# Patient Record
Sex: Female | Born: 1935 | Race: White | Hispanic: No | Marital: Married | Smoking: Never smoker
Health system: Southern US, Community
[De-identification: ages and names within clinical notes are randomized; demographics above are authoritative.]

## PROBLEM LIST (undated history)

## (undated) DIAGNOSIS — D693 Immune thrombocytopenic purpura: Secondary | ICD-10-CM

## (undated) DIAGNOSIS — D539 Nutritional anemia, unspecified: Secondary | ICD-10-CM

## (undated) DIAGNOSIS — K449 Diaphragmatic hernia without obstruction or gangrene: Secondary | ICD-10-CM

## (undated) DIAGNOSIS — H9193 Unspecified hearing loss, bilateral: Secondary | ICD-10-CM

## (undated) DIAGNOSIS — R310 Gross hematuria: Secondary | ICD-10-CM

## (undated) DIAGNOSIS — M810 Age-related osteoporosis without current pathological fracture: Secondary | ICD-10-CM

## (undated) DIAGNOSIS — I639 Cerebral infarction, unspecified: Secondary | ICD-10-CM

## (undated) DIAGNOSIS — K859 Acute pancreatitis without necrosis or infection, unspecified: Secondary | ICD-10-CM

## (undated) DIAGNOSIS — I2510 Atherosclerotic heart disease of native coronary artery without angina pectoris: Secondary | ICD-10-CM

## (undated) DIAGNOSIS — R5383 Other fatigue: Secondary | ICD-10-CM

## (undated) DIAGNOSIS — I619 Nontraumatic intracerebral hemorrhage, unspecified: Secondary | ICD-10-CM

## (undated) DIAGNOSIS — D696 Thrombocytopenia, unspecified: Secondary | ICD-10-CM

## (undated) DIAGNOSIS — N200 Calculus of kidney: Secondary | ICD-10-CM

## (undated) DIAGNOSIS — E7800 Pure hypercholesterolemia, unspecified: Secondary | ICD-10-CM

## (undated) HISTORY — DX: Calculus of kidney: N20.0

## (undated) HISTORY — DX: Other fatigue: R53.83

## (undated) HISTORY — DX: Gross hematuria: R31.0

## (undated) HISTORY — PX: COLONOSCOPY: SHX174

## (undated) HISTORY — DX: Nontraumatic intracerebral hemorrhage, unspecified: I61.9

## (undated) HISTORY — DX: Age-related osteoporosis without current pathological fracture: M81.0

## (undated) HISTORY — PX: TUBAL LIGATION: SHX77

## (undated) HISTORY — DX: Immune thrombocytopenic purpura: D69.3

## (undated) HISTORY — DX: Nutritional anemia, unspecified: D53.9

## (undated) HISTORY — DX: Thrombocytopenia, unspecified: D69.6

## (undated) HISTORY — DX: Pure hypercholesterolemia, unspecified: E78.00

## (undated) HISTORY — DX: Atherosclerotic heart disease of native coronary artery without angina pectoris: I25.10

## (undated) HISTORY — DX: Unspecified hearing loss, bilateral: H91.93

## (undated) HISTORY — PX: APPENDECTOMY: SHX54

## (undated) HISTORY — DX: Acute pancreatitis without necrosis or infection, unspecified: K85.90

## (undated) HISTORY — DX: Diaphragmatic hernia without obstruction or gangrene: K44.9

## (undated) HISTORY — DX: Cerebral infarction, unspecified: I63.9

---

## 1968-09-19 DIAGNOSIS — K859 Acute pancreatitis without necrosis or infection, unspecified: Secondary | ICD-10-CM

## 1968-09-19 HISTORY — DX: Acute pancreatitis without necrosis or infection, unspecified: K85.90

## 1998-04-16 ENCOUNTER — Ambulatory Visit (HOSPITAL_COMMUNITY): Admission: RE | Admit: 1998-04-16 | Discharge: 1998-04-16 | Payer: Self-pay | Admitting: Obstetrics and Gynecology

## 1998-04-20 ENCOUNTER — Ambulatory Visit (HOSPITAL_COMMUNITY): Admission: RE | Admit: 1998-04-20 | Discharge: 1998-04-20 | Payer: Self-pay | Admitting: Obstetrics and Gynecology

## 1998-05-19 ENCOUNTER — Other Ambulatory Visit: Admission: RE | Admit: 1998-05-19 | Discharge: 1998-05-19 | Payer: Self-pay | Admitting: Obstetrics and Gynecology

## 1998-05-27 ENCOUNTER — Ambulatory Visit (HOSPITAL_COMMUNITY): Admission: RE | Admit: 1998-05-27 | Discharge: 1998-05-27 | Payer: Self-pay | Admitting: Gastroenterology

## 1999-05-07 ENCOUNTER — Encounter: Payer: Self-pay | Admitting: Family Medicine

## 1999-05-07 ENCOUNTER — Ambulatory Visit (HOSPITAL_COMMUNITY): Admission: RE | Admit: 1999-05-07 | Discharge: 1999-05-07 | Payer: Self-pay | Admitting: Family Medicine

## 1999-05-28 ENCOUNTER — Other Ambulatory Visit: Admission: RE | Admit: 1999-05-28 | Discharge: 1999-05-28 | Payer: Self-pay | Admitting: Family Medicine

## 1999-09-03 ENCOUNTER — Encounter: Payer: Self-pay | Admitting: Orthopedic Surgery

## 1999-09-03 ENCOUNTER — Ambulatory Visit (HOSPITAL_COMMUNITY): Admission: RE | Admit: 1999-09-03 | Discharge: 1999-09-03 | Payer: Self-pay | Admitting: Orthopedic Surgery

## 2000-05-08 ENCOUNTER — Encounter: Payer: Self-pay | Admitting: Family Medicine

## 2000-05-08 ENCOUNTER — Ambulatory Visit (HOSPITAL_COMMUNITY): Admission: RE | Admit: 2000-05-08 | Discharge: 2000-05-08 | Payer: Self-pay | Admitting: Family Medicine

## 2000-07-28 ENCOUNTER — Emergency Department (HOSPITAL_COMMUNITY): Admission: EM | Admit: 2000-07-28 | Discharge: 2000-07-28 | Payer: Self-pay | Admitting: Emergency Medicine

## 2001-05-11 ENCOUNTER — Encounter: Payer: Self-pay | Admitting: Family Medicine

## 2001-05-11 ENCOUNTER — Ambulatory Visit (HOSPITAL_COMMUNITY): Admission: RE | Admit: 2001-05-11 | Discharge: 2001-05-11 | Payer: Self-pay | Admitting: Family Medicine

## 2001-07-18 ENCOUNTER — Encounter: Payer: Self-pay | Admitting: Family Medicine

## 2001-07-18 ENCOUNTER — Encounter: Admission: RE | Admit: 2001-07-18 | Discharge: 2001-07-18 | Payer: Self-pay | Admitting: Family Medicine

## 2001-07-25 ENCOUNTER — Encounter: Payer: Self-pay | Admitting: Family Medicine

## 2001-07-25 ENCOUNTER — Encounter: Admission: RE | Admit: 2001-07-25 | Discharge: 2001-07-25 | Payer: Self-pay | Admitting: Family Medicine

## 2001-08-01 ENCOUNTER — Ambulatory Visit (HOSPITAL_COMMUNITY): Admission: RE | Admit: 2001-08-01 | Discharge: 2001-08-01 | Payer: Self-pay | Admitting: Gastroenterology

## 2001-08-01 ENCOUNTER — Encounter (INDEPENDENT_AMBULATORY_CARE_PROVIDER_SITE_OTHER): Payer: Self-pay | Admitting: Specialist

## 2001-08-24 ENCOUNTER — Ambulatory Visit (HOSPITAL_BASED_OUTPATIENT_CLINIC_OR_DEPARTMENT_OTHER): Admission: RE | Admit: 2001-08-24 | Discharge: 2001-08-24 | Payer: Self-pay | Admitting: Urology

## 2001-08-24 ENCOUNTER — Encounter (INDEPENDENT_AMBULATORY_CARE_PROVIDER_SITE_OTHER): Payer: Self-pay | Admitting: *Deleted

## 2001-09-19 DIAGNOSIS — I2510 Atherosclerotic heart disease of native coronary artery without angina pectoris: Secondary | ICD-10-CM

## 2001-09-19 HISTORY — DX: Atherosclerotic heart disease of native coronary artery without angina pectoris: I25.10

## 2002-05-31 ENCOUNTER — Encounter: Payer: Self-pay | Admitting: Family Medicine

## 2002-05-31 ENCOUNTER — Ambulatory Visit (HOSPITAL_COMMUNITY): Admission: RE | Admit: 2002-05-31 | Discharge: 2002-05-31 | Payer: Self-pay | Admitting: Internal Medicine

## 2002-06-11 ENCOUNTER — Other Ambulatory Visit: Admission: RE | Admit: 2002-06-11 | Discharge: 2002-06-11 | Payer: Self-pay | Admitting: Family Medicine

## 2003-06-04 ENCOUNTER — Encounter: Payer: Self-pay | Admitting: Family Medicine

## 2003-06-04 ENCOUNTER — Ambulatory Visit (HOSPITAL_COMMUNITY): Admission: RE | Admit: 2003-06-04 | Discharge: 2003-06-04 | Payer: Self-pay | Admitting: Family Medicine

## 2004-02-11 ENCOUNTER — Encounter: Admission: RE | Admit: 2004-02-11 | Discharge: 2004-02-11 | Payer: Self-pay | Admitting: Family Medicine

## 2004-07-30 ENCOUNTER — Ambulatory Visit (HOSPITAL_COMMUNITY): Admission: RE | Admit: 2004-07-30 | Discharge: 2004-07-30 | Payer: Self-pay | Admitting: Family Medicine

## 2005-03-08 ENCOUNTER — Other Ambulatory Visit: Admission: RE | Admit: 2005-03-08 | Discharge: 2005-03-08 | Payer: Self-pay | Admitting: Family Medicine

## 2005-03-15 ENCOUNTER — Encounter: Admission: RE | Admit: 2005-03-15 | Discharge: 2005-03-15 | Payer: Self-pay | Admitting: Family Medicine

## 2005-08-23 ENCOUNTER — Encounter: Admission: RE | Admit: 2005-08-23 | Discharge: 2005-08-23 | Payer: Self-pay | Admitting: Family Medicine

## 2005-11-11 ENCOUNTER — Encounter: Admission: RE | Admit: 2005-11-11 | Discharge: 2005-11-11 | Payer: Self-pay | Admitting: Family Medicine

## 2005-11-14 ENCOUNTER — Inpatient Hospital Stay (HOSPITAL_COMMUNITY): Admission: AD | Admit: 2005-11-14 | Discharge: 2005-11-16 | Payer: Self-pay | Admitting: Cardiology

## 2005-11-19 ENCOUNTER — Inpatient Hospital Stay (HOSPITAL_COMMUNITY): Admission: EM | Admit: 2005-11-19 | Discharge: 2005-11-22 | Payer: Self-pay | Admitting: Emergency Medicine

## 2005-12-15 ENCOUNTER — Encounter (HOSPITAL_COMMUNITY): Admission: RE | Admit: 2005-12-15 | Discharge: 2006-03-15 | Payer: Self-pay | Admitting: Cardiology

## 2006-03-16 ENCOUNTER — Encounter (HOSPITAL_COMMUNITY): Admission: RE | Admit: 2006-03-16 | Discharge: 2006-04-18 | Payer: Self-pay | Admitting: Cardiology

## 2006-10-25 ENCOUNTER — Encounter: Admission: RE | Admit: 2006-10-25 | Discharge: 2006-10-25 | Payer: Self-pay | Admitting: Family Medicine

## 2006-11-07 ENCOUNTER — Encounter: Admission: RE | Admit: 2006-11-07 | Discharge: 2006-11-07 | Payer: Self-pay | Admitting: Family Medicine

## 2006-11-23 ENCOUNTER — Ambulatory Visit (HOSPITAL_COMMUNITY): Admission: RE | Admit: 2006-11-23 | Discharge: 2006-11-24 | Payer: Self-pay | Admitting: Cardiology

## 2007-09-27 ENCOUNTER — Other Ambulatory Visit: Admission: RE | Admit: 2007-09-27 | Discharge: 2007-09-27 | Payer: Self-pay | Admitting: Family Medicine

## 2007-11-12 ENCOUNTER — Ambulatory Visit (HOSPITAL_COMMUNITY): Admission: RE | Admit: 2007-11-12 | Discharge: 2007-11-12 | Payer: Self-pay | Admitting: Family Medicine

## 2007-12-08 ENCOUNTER — Emergency Department (HOSPITAL_COMMUNITY): Admission: EM | Admit: 2007-12-08 | Discharge: 2007-12-08 | Payer: Self-pay | Admitting: Emergency Medicine

## 2008-08-21 ENCOUNTER — Encounter: Admission: RE | Admit: 2008-08-21 | Discharge: 2008-08-21 | Payer: Self-pay | Admitting: Family Medicine

## 2008-09-19 DIAGNOSIS — I639 Cerebral infarction, unspecified: Secondary | ICD-10-CM

## 2008-09-19 DIAGNOSIS — I619 Nontraumatic intracerebral hemorrhage, unspecified: Secondary | ICD-10-CM

## 2008-09-19 HISTORY — PX: CARDIAC VALVE SURGERY: SHX40

## 2008-09-19 HISTORY — DX: Cerebral infarction, unspecified: I63.9

## 2008-09-19 HISTORY — DX: Nontraumatic intracerebral hemorrhage, unspecified: I61.9

## 2008-10-01 ENCOUNTER — Inpatient Hospital Stay (HOSPITAL_COMMUNITY): Admission: AD | Admit: 2008-10-01 | Discharge: 2008-10-02 | Payer: Self-pay | Admitting: Cardiology

## 2009-04-16 ENCOUNTER — Encounter: Admission: RE | Admit: 2009-04-16 | Discharge: 2009-04-16 | Payer: Self-pay | Admitting: Family Medicine

## 2009-06-19 HISTORY — PX: CORONARY ARTERY BYPASS GRAFT: SHX141

## 2009-07-11 ENCOUNTER — Ambulatory Visit: Payer: Self-pay | Admitting: Cardiology

## 2009-07-11 ENCOUNTER — Observation Stay (HOSPITAL_COMMUNITY): Admission: EM | Admit: 2009-07-11 | Discharge: 2009-07-12 | Payer: Self-pay | Admitting: Emergency Medicine

## 2009-07-22 ENCOUNTER — Inpatient Hospital Stay (HOSPITAL_COMMUNITY): Admission: EM | Admit: 2009-07-22 | Discharge: 2009-07-23 | Payer: Self-pay | Admitting: Emergency Medicine

## 2009-08-18 ENCOUNTER — Encounter: Admission: RE | Admit: 2009-08-18 | Discharge: 2009-09-15 | Payer: Self-pay | Admitting: Family Medicine

## 2009-08-20 ENCOUNTER — Encounter (HOSPITAL_COMMUNITY): Admission: RE | Admit: 2009-08-20 | Discharge: 2009-09-18 | Payer: Self-pay | Admitting: Cardiology

## 2009-09-19 ENCOUNTER — Encounter (HOSPITAL_COMMUNITY): Admission: RE | Admit: 2009-09-19 | Discharge: 2009-09-28 | Payer: Self-pay | Admitting: Cardiology

## 2010-06-22 ENCOUNTER — Ambulatory Visit (HOSPITAL_COMMUNITY): Admission: RE | Admit: 2010-06-22 | Discharge: 2010-06-22 | Payer: Self-pay | Admitting: Family Medicine

## 2010-10-10 ENCOUNTER — Encounter: Payer: Self-pay | Admitting: Family Medicine

## 2010-12-22 ENCOUNTER — Other Ambulatory Visit: Payer: Self-pay | Admitting: Family Medicine

## 2010-12-22 LAB — BASIC METABOLIC PANEL
BUN: 9 mg/dL (ref 6–23)
CO2: 25 mEq/L (ref 19–32)
Chloride: 102 mEq/L (ref 96–112)
Creatinine, Ser: 0.78 mg/dL (ref 0.4–1.2)
Potassium: 4 mEq/L (ref 3.5–5.1)

## 2010-12-22 LAB — COMPREHENSIVE METABOLIC PANEL
ALT: 18 U/L (ref 0–35)
Alkaline Phosphatase: 101 U/L (ref 39–117)
CO2: 23 mEq/L (ref 19–32)
GFR calc non Af Amer: >60 mL/min (ref >60)
Glucose, Bld: 129 mg/dL — ABNORMAL HIGH (ref 70–99)
Potassium: 4.3 mEq/L (ref 3.5–5.1)
Sodium: 134 mEq/L — ABNORMAL LOW (ref 135–145)

## 2010-12-22 LAB — DIFFERENTIAL
Basophils Relative: 0 % (ref 0–1)
Eosinophils Absolute: 0.1 10*3/uL (ref 0.0–0.7)
Neutrophils Relative %: 55 % (ref 43–77)

## 2010-12-22 LAB — CBC
HCT: 31.8 % — ABNORMAL LOW (ref 36.0–46.0)
MCHC: 34.1 g/dL (ref 30.0–36.0)
MCHC: 34.6 g/dL (ref 30.0–36.0)
MCV: 89.5 fL (ref 78.0–100.0)
MCV: 90.3 fL (ref 78.0–100.0)
RBC: 3.52 MIL/uL — ABNORMAL LOW (ref 3.87–5.11)
RBC: 3.64 MIL/uL — ABNORMAL LOW (ref 3.87–5.11)
WBC: 4.8 10*3/uL (ref 4.0–10.5)

## 2010-12-22 LAB — TSH: TSH: 0.603 u[IU]/mL (ref 0.350–4.500)

## 2010-12-22 LAB — PROTIME-INR
INR: 1.17 (ref 0.00–1.49)
Prothrombin Time: 14.8 seconds (ref 11.6–15.2)

## 2010-12-22 LAB — TROPONIN I: Troponin I: 0.04 ng/mL (ref 0.00–0.06)

## 2010-12-22 LAB — LIPID PANEL
HDL: 31 mg/dL — ABNORMAL LOW (ref >39)
Triglycerides: 82 mg/dL (ref <150)
VLDL: 16 mg/dL (ref 0–40)

## 2010-12-22 LAB — CK TOTAL AND CKMB (NOT AT ARMC): Relative Index: INVALID (ref 0.0–2.5)

## 2010-12-23 LAB — CARDIAC PANEL(CRET KIN+CKTOT+MB+TROPI)
CK, MB: 2.4 ng/mL (ref 0.3–4.0)
CK, MB: 3.1 ng/mL (ref 0.3–4.0)
Relative Index: INVALID (ref 0.0–2.5)
Total CK: 39 U/L (ref 7–177)

## 2010-12-23 LAB — POCT CARDIAC MARKERS
CKMB, poc: 1.5 ng/mL (ref 1.0–8.0)
Troponin i, poc: 0.08 ng/mL (ref 0.00–0.09)
Troponin i, poc: 0.09 ng/mL (ref 0.00–0.09)

## 2010-12-23 LAB — DIFFERENTIAL
Basophils Absolute: 0 10*3/uL (ref 0.0–0.1)
Eosinophils Absolute: 0.1 10*3/uL (ref 0.0–0.7)
Eosinophils Relative: 1 % (ref 0–5)

## 2010-12-23 LAB — PROTIME-INR: Prothrombin Time: 13.3 seconds (ref 11.6–15.2)

## 2010-12-23 LAB — CBC
HCT: 32.6 % — ABNORMAL LOW (ref 36.0–46.0)
MCHC: 34.8 g/dL (ref 30.0–36.0)
MCV: 91.9 fL (ref 78.0–100.0)
Platelets: 331 10*3/uL (ref 150–400)
RDW: 13.9 % (ref 11.5–15.5)

## 2010-12-23 LAB — BASIC METABOLIC PANEL
BUN: 12 mg/dL (ref 6–23)
CO2: 24 mEq/L (ref 19–32)
Chloride: 103 mEq/L (ref 96–112)
GFR calc Af Amer: >60 mL/min (ref >60)
Glucose, Bld: 117 mg/dL — ABNORMAL HIGH (ref 70–99)
Sodium: 135 mEq/L (ref 135–145)

## 2010-12-23 LAB — TROPONIN I: Troponin I: 0.12 ng/mL — ABNORMAL HIGH (ref 0.00–0.06)

## 2010-12-28 ENCOUNTER — Ambulatory Visit
Admission: RE | Admit: 2010-12-28 | Discharge: 2010-12-28 | Disposition: A | Discharge status: Home / Self Care | Payer: BC Managed Care – PPO | Source: Ambulatory Visit | Attending: Family Medicine | Admitting: Family Medicine

## 2011-01-03 LAB — CBC
HCT: 35 % — ABNORMAL LOW (ref 36.0–46.0)
Platelets: 129 10*3/uL — ABNORMAL LOW (ref 150–400)
RDW: 13.4 % (ref 11.5–15.5)
WBC: 8.3 10*3/uL (ref 4.0–10.5)

## 2011-01-03 LAB — BASIC METABOLIC PANEL
BUN: 19 mg/dL (ref 6–23)
Calcium: 9.1 mg/dL (ref 8.4–10.5)
GFR calc non Af Amer: 58 mL/min — ABNORMAL LOW (ref >60)
Potassium: 4.1 mEq/L (ref 3.5–5.1)
Sodium: 139 mEq/L (ref 135–145)

## 2011-02-01 NOTE — Cardiovascular Report (Signed)
[name], [name] NO.:  [id number]   MEDICAL RECORD NO.:  [id number]          PATIENT TYPE:  INP   LOCATION:  2502                         FACILITY:  [facility]   PHYSICIAN:  Adrienne Bathe, MD      DATE OF BIRTH:  1933-06-04   DATE OF PROCEDURE:  10/01/2008  DATE OF DISCHARGE:                            CARDIAC CATHETERIZATION   CARDIOLOGIST:  Adrienne December, MD   INDICATIONS:  Chest pain.  This is a 77 year old female with a history  of HOCM with coronary artery disease status post stent implantation x2  in the mid LAD on November 15, 2005, with recurrent chest discomfort.   PROCEDURES:  1. Left heart catheterization.  2. Selective coronary angiography.  3. Left ventriculogram.   PROCEDURE DETAILS:  The patient was prepped and draped in a sterile  fashion, placed on the catheterization table.  A 1% lidocaine was used  for local anesthesia of the right groin.  The femoral head was  visualized with fluoroscopy.  Prior to procedure, informed consent was  obtained and risks and benefits of the procedure including stroke, heart  attack, death, renal impairment, arterial damage were explained to the  patient at length.  Unfortunately, Dr. Amil [name] was unable to perform the  diagnostic portion of this catheterization secondary to him being in a  defibrillator procedure; however, I spoke at length with the patient and  she was fine with me performing the diagnostic portion.  He explained  the medical history of the patient to me at length.  Using the modified  Seldinger technique, a 6-French sheath was placed into the right femoral  artery.  A Judkins left #4 catheter was then used to selectively  cannulate the left main artery.  Multiple views with hand injection of  Omnipaque were obtained.  Over the wire, exchange was performed with a  Judkins right #4 catheter, which was used to selectively cannulate the  right coronary artery.  Multiple views with hand injection  of Omnipaque  were obtained.  This catheter was then exchanged for an angled pigtail,  which was used across the aortic valve into the left ventricle.  Hemodynamics obtained.  A left ventriculogram was performed utilizing 20  mL of dye in the RAO position with power injection.  As of note prior to  procedure, prednisone was administered previously given her IODINE  ALLERGY.  She also received Pepcid and Benadryl.  See cath records for  details.  Following procedure, findings were discussed with Dr. Amil [name]  who proceeded with IVUS and possible PCI.   FINDINGS:  1. Left main artery - Widely patent.  No angiographically significant      coronary artery disease.  Branches into the left anterior      descending artery and circumflex artery.  2. Left anterior descending artery - There is new proximal stenosis,      which is calcified, focal lesion proximal to the previously placed      mid LAD stent.  Stenosis is 70-80%.  Previously placed stents have      minor irregularities within, however, are grossly patent.  The      first large diagonal branch has approximately 50-60% ostial      stenosis.  The LAD then continues to wrap around the apex.  3. Circumflex artery - The first obtuse marginal branch has an      approximately 50% stenosis ostial.  Otherwise, this is a large-      caliber vessel without any other significant disease present.  4. Right coronary artery - There is a 50-60% lesion in the mid right      coronary artery at a bend.  This lesion has not significantly      changed from previous catheterization in 2007.  This is the      dominant vessel giving rise to the posterior descending artery.  5. Left ventriculogram - Normal left ventricular ejection fraction      with no wall motion abnormalities.   HEMODYNAMIC RESULTS:  Left ventricular systolic pressure is 133.  Aortic  pressure is 133/51 with a mean of 82.  There is no significant gradient.  I did not detect an outflow  tract gradient.  She does have a history of  HOCM.   IMPRESSION:  1. Worsening coronary artery disease - 70-80% proximal left anterior      descending calcified lesion proximal to the mid left anterior      descending stented portion.  Other disease includes 50% ostial      diagonal branch, 50% ostial circumflex, obtuse marginal-1 branch,      as well as 50-60% mid right coronary artery branch without any      significant change from prior catheterization.  2. Normal left ventricular ejection fraction of 60-65% with no wall      motion abnormalities.  There is 1+ mitral regurgitation noted, may      be catheter induced.  3. Mildly elevated left ventricular end-diastolic pressure of 13      indicative of mild diastolic dysfunction.  4. No evidence of aortic stenosis.   PLAN:  Findings discussed with Dr. Corliss [name] who will now perform  intravascular ultrasound to further evaluate her proximal LAD lesion  with possible percutaneous intervention to this artery.  Findings were  also discussed with the patient.      Adrienne Bathe, MD  Electronically Signed     MCS/MEDQ  D:  10/01/2008  T:  10/02/2008  Job:  [id number]   cc:   [name]. [name], M.D.

## 2011-02-01 NOTE — Discharge Summary (Signed)
NAMEMEMORY, HEINRICHS             ACCOUNT NO.:  [id number]   MEDICAL RECORD NO.:  [id number]          PATIENT TYPE:  INP   LOCATION:  2502                         FACILITY:  [facility]   PHYSICIAN:  Francisca December, M.D.  DATE OF BIRTH:  Mar 25, 1933   DATE OF ADMISSION:  10/01/2008  DATE OF DISCHARGE:  10/02/2008                               DISCHARGE SUMMARY   DISCHARGE DIAGNOSES:  1. Coronary artery disease, status post drug-eluting stent to the left      anterior descending.  2. 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  3. Severe mitral regurgitation.  4. Hyperlipidemia.  5. INTRAVENOUS CONTRAST allergy.  6. Multiple medication sensitivities including multiple statins,      significant cough on ACE inhibitors and angiotensin II receptor      blockers.  7. Long-term medication use.   HOSPITAL COURSE:  [name] is a 77 year old female who has known  coronary artery disease.  She has had multiple stent placements to her  LAD.  She returned continuing to demonstrate New York Heart Association  Class III symptoms.  If she walks briskly one flight of stairs dyspnea,  chest tightness, lightheadedness is produced.  She stops to rest for 5  minutes and symptoms stop.  She was seen in the office and desired to  proceed with cardiac catheterization.   The catheterization took place on October 01, 2008, and she was found to  have 80% proximal LAD lesion just prior to her stent.  In-stent mild  irregularity.  The first diagonal had 80% ostial lesion, circumflex 50%  OM at the ostium.  The right coronary artery 60% mid RCA.  This had been  unchanged from previous cath.  Her EF is normal.   Dr. Amil Amen then came in and implanted a drug-eluting stent to the LAD  without trouble.  She remained in the hospital overnight and was in  stable condition.   LABORATORY STUDIES:  Hemoglobin 12.1, hematocrit 35.0, platelets 129,  and white count 8.3.  Sodium 139, potassium 4.1, chloride  109, CO2 of  25, BUN 19, creatinine 0.95, glucose 114, and calcium 9.1.   She is being discharged to home in stable and in improved condition.   DISCHARGE MEDICATIONS:  1. Vytorin 1 tablet daily.  2. Multivitamin daily.  3. Enteric-coated aspirin 325 mg a day.  4. Plavix 75 mg a day.  5. Metoprolol 50 mg twice a day.  6. Os-Cal daily.  7. Atacand daily.  8. Lysine, Aquaphor, Omacor daily.  9. Zantac 150 mg twice a day.  10.Verapamil 180 mg each evening.  11.Benadryl at bedtime as needed.  12.Sublingual nitroglycerin p.r.n. chest pain.   She is to remain on a low-sodium, heart-healthy diet.  Clean cath site  gently with soap and water.  No scrubbing.  May increase activity as per  cardiac rehabilitation.  No lifting over 10 pounds for 1 week.  No  driving for 2 days.  Follow up with Dr. Laureen Abrahams, nurse  practitioner on October 16, 2008, at 10 a.m.      Guy Franco, P.A.      John H.  Amil Amen, M.D.  Electronically Signed    LB/MEDQ  D:  10/02/2008  T:  10/03/2008  Job:  [id number]   cc:   Francisca December, M.D.  Bryan Lemma. Manus Gunning, M.D.

## 2011-02-04 NOTE — Procedures (Signed)
[facility]  Patient:    [name], [name] Visit Number: [id number] MRN: [id number]          Service Type: Attending:  Verlin Grills, M.D. Dictated by:   Verlin Grills, M.D. Proc. Date: 08/01/01   CC:         [name], M.D.   Procedure Report  REFERRING PHYSICIAN:  Blair Heys, M.D., [facility].  PROCEDURE:  Colonoscopy with polypectomy.  ENDOSCOPIST:  Verlin Grills, M.D.  INDICATIONS:  [name] (date of birth 1933/01/07), is a 77 year old female who is referred for diagnostic colonoscopy to evaluate guaiac positive stool.  [name] has two first degree relatives diagnosed with colon cancer (mother and sister).  I discussed with [name] the complications associated with colonoscopy and polypectomy including intestinal bleeding and intestinal perforation.  [name] has has signed the operative permit.  PREMEDICATION:  Fentanyl 50 mcg, Versed 10 mg.  ENDOSCOPE:  Olympus pediatric colonoscope.  PROCEDURE IN DETAIL:  After obtaining informed consent, [name] was placed in the left lateral decubitus position.  I administered intravenous Versed and intravenous Fentanyl to achieve conscious sedation for the procedure.  The patients blood pressure, oxygen saturation, and cardiac rhythm were monitored throughout the procedure and documented in the medical record.  Anal inspection was normal.  Digital rectal exam was normal.  The Olympus pediatric video colonoscope was introduced into the rectum and advanced to the cecum.  Colonic preparation for the exam today was excellent.  RECTUM:  From the distal rectum two 0.5 mm sessile polyps were removed with cold biopsy forceps and submitted for pathological interpretation.  SIGMOID COLON AND DESCENDING COLON:  Normal.  SPLENIC FLEXURE:  Normal.  TRANSVERSE COLON:  Normal.  HEPATIC FLEXURE:  Normal.  ASCENDING  COLON:  Normal.  CECUM AND ILEOCECAL VALVE:  Normal.  ASSESSMENT:  Two 0.5 mm sessile polyps were removed from the distal rectum with cold biopsy forceps; otherwise normal proctocolonoscopy to the cecum.  RECOMMENDATIONS:  Repeat colonoscopy in approximately five years. Dictated by:   Verlin Grills, M.D. Attending:  Verlin Grills, M.D. DD:  08/01/01 TD:  08/01/01 Job: [id number]

## 2011-02-04 NOTE — Cardiovascular Report (Signed)
[name], [name]             ACCOUNT NO.:  [id number]   MEDICAL RECORD NO.:  [id number]          PATIENT TYPE:  INP   LOCATION:  3728                         FACILITY:  [facility]   PHYSICIAN:  Vesta Mixer, M.D. DATE OF BIRTH:  1933-02-28   DATE OF PROCEDURE:  11/19/2005  DATE OF DISCHARGE:                              CARDIAC CATHETERIZATION   PROCEDURES:  Left heart catheterization with left ventriculogram. Right  femoral artery was easily cannulated using modified Seldinger technique and  a Smart needle.   HEMODYNAMIC RESULTS:  LV pressure following the LV gram was 130/7. Aortic  pressure was 105/59.   The initial left ventricular pressure when we just entered the aortic valve  was 105 systolic.   ANGIOGRAPHY:  Left main is normal.   Left anterior descending artery has mild disease proximally. The proximal  and midstents are widely patent. There is a large diagonal artery that  originates in the middle of the stented segment. This vessel still has an  80% stenosis at its origin. This is unchanged from the previous heart  catheterization performed last week by Dr. Amil Amen. The flow was quite brisk  down this vessel.   Left circumflex artery has mild irregularities in the proximal segment.  There was 20-30% stenosis at the origin of the first obtuse marginal artery.   Right coronary artery has a proximal 50% stenosis. This was unchanged from a  previous cath. It does not appear to obstruct flow.   Left ventriculogram reveals overall normal left ventricular systolic  function. There is moderate hypokinesis of the anterior apex but this  appears to be somewhat improved compared to her previous cath.   She was noted to have a gradient on the LV to AO pullback. When we initially  entered the left ventricle, she did not have any evidence of a gradient. In  fact, the aortic valve is crossed quite easily. There is certainly no aortic  stenosis. We performed the pullback  twice and both times reported a gradient  from the mid to left ventricle to the aorta.   It also should be noted that we gave her metoprolol during the case. Slowing  her heart rate dramatically increased her pulse pressure.   COMPLICATIONS:  None.   CONCLUSION:  1.  Coronary artery disease: She has a nice result from her left anterior      descending artery stenting. The diagonal vessel was still somewhat      compromised but there was brisk flow and I do not think that further      angioplasty or stenting is warranted at this time.  2.  Hypertrophic obstructive cardiomyopathy: The patient has significant mid-      ventricular outflow obstruction. This may be the cause of her severe      chest pain. We will increase her metoprolol to 50 milligrams p.o. b.i.d.      and will give her intermittent doses of IV metoprolol as needed. We will      stop Integrilin, heparin and      nitroglycerin. We took a brief angiogram of the right femoral  artery. It      revealed a nice artery with no evidence of pseudoaneurysm. She does have      a moderate-sized hematoma associated with this vessel from her previous      heart catheterization. She will return to the step-down unit in      satisfactory condition.           ______________________________  Vesta Mixer, M.D.     PJN/MEDQ  D:  11/19/2005  T:  11/21/2005  Job:  [id number]   cc:   Bryan Lemma. Manus Gunning, M.D.  Fax: [phone]   Francisca December, M.D.  Fax: [phone]

## 2011-02-04 NOTE — H&P (Signed)
Adrienne, [name] NO.:  [id number]   MEDICAL RECORD NO.:  [id number]          PATIENT TYPE:  INP   LOCATION:  2926                         FACILITY:  [facility]   PHYSICIAN:  Vesta Mixer, M.D. DATE OF BIRTH:  11-26-1932   DATE OF ADMISSION:  11/19/2005  DATE OF DISCHARGE:                                HISTORY & PHYSICAL   [name] is a 77 year old female with a recent diagnosis of coronary  artery disease. She is status post recent PTCA and stenting to her left  anterior descending artery. She presents today with episodes of light  headedness and diaphoresis and hypotension. She was also found to have  return of some EKG abnormalities. She is admitted for further evaluation.   [name] has been in relatively good health. She has a history of  hyperlipidemia for some time. She recently started having some very atypical  symptoms. She was found on EKG to have Q waves in the anterior leads. A  subsequent echocardiogram revealed akinesis of the distal anterior wall as  well as her apex. She was also found to have severe mitral regurgitation.  She underwent catheterization earlier this week and was found to have severe  disease involving her left anterior descending artery and diagonal. She  underwent successful PTCA of her left anterior descending artery using two  0.75 mm Taxus stents. Patient was noted to have compromise of the first  diagonal vessel at the end of the procedure of about 80%. This diagonal was  jailed by the stent struts. Dr. Amil Amen was able to angioplasty out into the  diagonal vessel and improved the lumen slightly but there still remained an  80% stenosis at the end of the case.   The patient was discharged on November 16, 2005. She now returns on November 19, 2005. She was awakened from her sleep with profuse sweating and light  headedness. She took her blood pressure and found she had a systolic  pressure of 70. She called EMS. She  was brought to the hospital and was  given IV fluids. EKG revealed Q waves in the anterior leads. She was found  to have return of her Q wave inversions across the anterolateral leads. She  was also found to have new T wave inversions in the inferior leads. She is  now admitted for further evaluation.   She feels a little bit better although she is still having a little bit of  chest tightness or closeness as she tells it.   CURRENT MEDICATIONS:  1.  Plavix 75 mg daily.  2.  Aspirin 325 mg daily.  3.  Altace 2.5 mg daily.   ALLERGIES:  IV CONTRAST.   PAST MEDICAL HISTORY:  1.  Hyperlipidemia.  2.  Hyponatremia.  3.  Recent anterior wall myocardial infarction.  4.  Mitral regurgitation.   SOCIAL HISTORY:  The patient is a non smoker.   FAMILY HISTORY:  Positive for cardiac disease.   REVIEW OF SYSTEMS:  Reviewed and are negative except as noted in the history  of present illness.   PHYSICAL EXAMINATION:  GENERAL:  She is an elderly female in no acute  distress. She is still having slight chest pressure but it seems to be  slowly improving.  VITAL SIGNS:  Blood pressure is 115/70 with a heart rate of 80.  HEENT:  Reveals 2+ carotids, no bruits, no jugular venous distension, no  thyromegaly.  LUNGS:  Clear to auscultation.  HEART:  Regular rate S1, S2. She has a 2/6 systolic ejection murmur at the  left sternal border.  ABDOMEN:  Good bowel sounds, nontender.  EXTREMITIES:  The groin is still slightly tender.  She has good distal  pulse.  There is no edema.  NEURO EXAM:  Nonfocal.   Her EKG reveals normal sinus rhythm. There is T wave inversion in the  anterolateral leads and T wave inversions in the inferior leads. The  anterolateral T wave inversions have been present prior to the stent  placement and have improved slightly. These have now returned to their pre-  stent appearance. The T wave inversions inferiorly are new but the  __________  (it should be noted she does  have a 50% RCA stenosis).   LABORATORY DATA:  Her CPK is 107 with an MB of 6.4, troponin is 0.3.   The remainder of her laboratory data is pending.   IMPRESSION AND PLAN:  Acute coronary syndrome. The patient presents  following a recent anterior wall myocardial infarction and stent placement.  The first diagonal artery was jailed during the stent procedure. Attempts  were made to open that vessel at that time but they were only partially  successful. It was determined that the stent placement out to the diagonal  vessel would not necessarily improve the lumen so it was decided to treat  her medically. She now presents with recurrent T wave inversions,  hypotension, and diaphoresis. We have placed her on heparin and Integrilin  and she is slowly improving. We will continue with medical therapy now and  will proceed to the cath-lab if she worsens. We will anticipate repeat heart  catheterization on Sunday by Dr. Amil Amen.           ______________________________  Vesta Mixer, M.D.     PJN/MEDQ  D:  11/19/2005  T:  11/19/2005  Job:  [id number]   cc:   Francisca December, M.D.  Fax: [phone]   Bryan Lemma. Manus Gunning, M.D.  Fax: [phone]

## 2011-02-04 NOTE — Discharge Summary (Signed)
[name], [name]             ACCOUNT NO.:  [id number]   MEDICAL RECORD NO.:  [id number]          PATIENT TYPE:  OIB   LOCATION:  6527                         FACILITY:  [facility]   PHYSICIAN:  Francisca December, M.D.  DATE OF BIRTH:  [birth year]   DATE OF ADMISSION:  11/23/2006  DATE OF DISCHARGE:  11/24/2006                               DISCHARGE SUMMARY   ADMISSION DIAGNOSIS:  Recurrent angina with an abnormal GXT.   DISCHARGE DIAGNOSIS:  1. Recurrent angina with an abnormal graded exercise test, status post      cardiac catheterization with percutaneous coronary      intervention/drug-eluting stent implantation in the proximal      anterior descending artery, November 23, 2006.  2. Two-vessel arteriosclerotic cardiovascular disease, status post      percutaneous coronary intervention/drug-eluting stent implantation      times two to the mid left anterior descending, November 15, 2005,      X2.  3. Normal left ventricular size and systolic function with an ejection      fraction of 75%.  4. Mild hypertrophic obstruction cardiomyopathy via 2-D echocardiogram      February, 2008.  5. Moderate mitral regurgitation.  6. Hyperlipidemia.  7. INTRAVENOUS CONTRAST DYE ALLERGY.  8. Multiple MEDICATION SENSITIVITIES including multiple STATIN'S.  9. Significant cough on ACE and ARB.  10.Status post appendectomy.  11.Status post tubal ligation.  12.Leukocytosis, questionable etiology.  13.Anemia, stable.  14.Mild hyperkalemia.   PROCEDURES:  On  11/23/2006:  1. Left heart catheterization.  2. Left ventriculogram.  3. Coronary angiography.  4. PCI/drug-eluting stent implantation to the proximal anterior      descending artery.   CONSULTATIONS:  None.   HOSPITAL COURSE:  [name] is a 77 year old female with a known  history of coronary artery disease.  She was admitted to the [facility] on November 23, 2006 as an outpatient for cardiac catheterization  secondary to angina  and an abnormal GXT.  She underwent cardiac  catheterization with PCI/drug-eluting stent implantation to the proximal  anterior descending artery.  The procedure was well tolerated by the  patient with an estimated minimal blood loss.  Angio-Seal closure was  used.  The patient was observed overnight with bedrest and IV hydration.  During this admission, she had no further complaints of angina, or  shortness of breath.  Her groin was stable without evidence of bleeding,  oozing, or hematoma.  She was discharged to home in stable condition  after being seen by Dr. Corliss Marcus.   LABORATORY DATA:  White blood count 11.4, hemoglobin 10.4, hematocrit  30.0, platelets 108,000.  Sodium 134, potassium 5.5, chloride 101, CO2  21, glucose 97, BUN 17, creatinine 0.91.   CLINICAL DATA:  X-rays:  None during this admission.  EKG: 12-lead EKG  November 24, 2006 revealed sinus bradycardia with a ventricular rate of 49  beats per minute.  There was no evidence of acute ST-segment/T-wave  changes.  QTC 429 milliseconds.   CONDITION ON DISCHARGE:  Stable.   DISCHARGE MEDICATIONS:  1. Plavix 75 mg daily.  2. Aspirin 325  mg daily.  3. Vytorin 10/20 mg daily.  4. Metoprolol 50 mg three times daily.  5. Atacand 4 mg daily.  6. Evista 60 mg daily.  7. Omacor 1000 mg twice daily.  8. Multivitamin daily.  9. Sublingual nitroglycerin 0.4 mg as needed for chest pain.  A      prescription refill was provided.  10.Gaviscon as needed.   DISCHARGE INSTRUCTIONS:  1. Continue a low-salt, low-fat, and low cholesterol diet.  2. No driving for 2 days.  3. No lifting greater than 10 pounds for 1 week.  4. Bathe groin area with warm soap and water.  Do not scrub area.      Avoid straining.   FOLLOWUP:  Follow-up arrangements:  An appointment for a groin check has  been scheduled with a physician extender at [facility] on November 30, 2006 at 11 o'clock a.m.  The patient is scheduled to call [phone]  if she  is unable to keep the scheduled appointment.      [name], [name]      Francisca December, M.D.  Electronically Signed    RDM/MEDQ  D:  11/24/2006  T:  11/24/2006  Job:  [id number]   cc:   Bryan Lemma. Manus Gunning, M.D.

## 2011-02-04 NOTE — Op Note (Signed)
[facility]  Patient:    [name], [name] Visit Number: [id number] MRN: [id number]          Service Type: NES Location: [facility] Attending Physician:  Londell Moh Dictated by:   Jamison Neighbor, M.D. Proc. Date: 08/24/01 Admit Date:  08/24/2001 Discharge Date: 08/24/2001   CC:         [name], M.D.   Operative Report  PREOPERATIVE DIAGNOSIS:  Microscopic hematuria.  POSTOPERATIVE DIAGNOSIS:  Microscopic hematuria.  PROCEDURES:  Cystoscopy, bilateral retrograde, bilateral renal pelvic washings, right ureteroscopy, right double J catheter insertion.  SURGEON:  Jamison Neighbor, M.D.  ANESTHESIA:  General.  COMPLICATIONS:  None.  DRAINS:  None.  BRIEF HISTORY:  This 77 year old female developed hematuria.  She has a strong allergy to iodine and for that reason cannot be given contrast.  Noncontrast CT demonstrated a little hydronephrosis on the right-hand side, but otherwise no abnormalities were seen.  The patient needs to undergo cystoscopy and does need to have an imaging study to further evaluate that mild hydronephrosis and for that reason, it was recommended that retrograde studies with non-ionic contrast be performed.  The patient understands the risks and benefits of the procedure.  She was given Benadryl preoperatively as well as appropriate antibiotics.  She gave full and informed consent.  DESCRIPTION OF PROCEDURE:  After the successful induction of general anesthesia, the patient was placed in the dorsal lithotomy position, prepped with Hibiclens, and draped in the usual sterile fashion.  Cystoscopy was performed with the 12 degree lens and then followed by that with the 70 degree lens.  Both ureteral orifices were normal in configuration and location.  No tumors or stones could be seen.  With the 12 degree lens in place, the retrograde study was performed on each side, but prior to the retrograde, washings  were done on each side.  A ureteral catheter was passed and washings with normal saline were sent for cytology.  Retrogrades were then performed through the same catheter, and on the left-hand side the calyceal system was noted to be very finely cupped.  There was a good drain-out film.  There was no sign of obstruction or filling defect.  On the left-hand side, the patient had moderate hydronephrosis that did seem to come all the way down to the bladder.  That ureteral orifice was generally normal but was a little hard to cannulate, but certainly it was not felt to have a true stenosis or stricture. Drain-out films were unremarkable without an obvious filling defect.  Because of the hydronephrosis noted on this study as well as earlier imaging studies, as well as the fact that the etiology for hematuria had not yet been obtained, it was felt that a ureteroscopic examination of that ureter was indicated. This ureter was dilated with a balloon dilator.  The 6 French ureteroscope was then advanced all the way through the ureter and actually up into the renal pelvis.  The calyceal systems were carefully inspected.  The upper calyces could easily be identified and visualized, as could the midpole calyces, but because this was a rigid ureteroscope, the lower pole calyces could not be visualized.  It was not felt that flexible ureteroscopy was indicated, since the patient had no signs of any filling defect on retrogrades and no abnormality seen on the previous CT.  The ureteroscope was withdrawn and the entire ureter was carefully inspected.  No tumors, stones, stricture, stenosis, or other abnormalities could be detected.  The guidewire was left in place and over the guidewire, a 6 French x 24 cm double J catheter was passed. The cystoscope was inserted alongside the guidewire, and after the guidewire was removed, the string was used to carefully position the stent so that it just came into the  bladder.  The bladder was inspected one more time with the 70 degree lens, and no abnormalities whatsoever could be detected.  The bladder was drained.  The patients string was attached to the skin with a Tegaderm.  A B&O suppository was inserted.  She was given intraoperative Toradol and Zofran.  She will be sent home with a prescription for Lorcet Plus, Pyridium Plus, and three days of Levaquin, and will remove her string herself on Monday.  The patient will return in follow-up in two weeks time. Dictated by:   Jamison Neighbor, M.D. Attending Physician:  Londell Moh DD:  08/24/01 TD:  08/25/01 Job: [id number]

## 2011-02-04 NOTE — Discharge Summary (Signed)
[name], [name]             ACCOUNT NO.:  [id number]   MEDICAL RECORD NO.:  [id number]          PATIENT TYPE:  INP   LOCATION:  3728                         FACILITY:  [facility]   PHYSICIAN:  [name], M.D.  DATE OF BIRTH:  [birth year]   DATE OF ADMISSION:  11/19/2005  DATE OF DISCHARGE:  11/22/2005                                 DISCHARGE SUMMARY   DISCHARGE DIAGNOSES:  1.  Hypertrophic obstructive cardiomyopathy, mild to moderate, beta blocker      therapy increased.  2.  Coronary artery disease with recent apical anteroapical myocardial      infarction.  3.  Family history of coronary artery disease.  4.  Hypercholesterolemia.  5.  Allergy to IV CONTRAST.  6.  Dyslipidemia.  7.  Moderate to severe mitral regurgitation.  8.  Multiple medication therapy.   HOSPITAL COURSE:  [name] is a 77 year old female who was initially  admitted from February 26-28, 2007, due to an anteroapical myocardial  infarction.  During that hospitalization she underwent percutaneous  intervention utilizing a drug-eluting stent to the mid-left anterior  descending artery.  She did have residual stent jail of moderate size to the  diagonal branch.  Her EF was 45%.  She was noted to have moderate mitral  regurgitation with normal right heart pressures.   She was readmitted during this hospitalization with dizziness, diaphoresis  and hypotension.  In addition, she had midsternal pain with right shoulder  tightness.  She was seen in the emergency room by Vesta Mixer, M.D.,  who was on call for Dr. Ty Hilts at that time.  She then underwent emergent  cardiac catheterization due to her recent percutaneous  intervention/myocardial infarction.  He also had noted that T-wave  inversions of the inferior leads were new.  The cardiac catheterization  findings were consistent with HOCM.  There was an LVOT gradient noted on the  pullback.  The LAD stents were widely patent and Dr. Elease Hashimoto felt that  the  patient was having symptoms due to her HOCM with a significant gradient  within her LV.  He recommended increasing her metoprolol and over the next  several days, this was done.  By November 22, 2005, she was ready for discharge  to home and her metoprolol has been increased significantly to 50 mg t.i.d.  Her other medications upon discharge included:   1.  Plavix 75 mg a day.  2.  Enteric-coated aspirin 325 mg a day.  3.  Vytorin 10/20 once daily.  4.  Lisinopril 5 mg one-half tablet daily.  5.  Sublingual nitroglycerin as needed for chest pain.   She is not to drive for two weeks.  No lifting over 10 pounds for two weeks.  No physical exertion until seen by Dr. Amil Amen.  She has an appointment to  see Dr. Amil Amen on December 01, 2005.   Other lab studies during the patient's hospitalization include a BUN of 18,  creatinine of 1.0.  Hemoglobin 13, hematocrit 36.7.  normal cardiac enzymes  with the exception of a maximum troponin at 0.10.   The patient is  discharged home in stable condition.      Guy Franco, P.A.      [name], M.D.  Electronically Signed    LB/MEDQ  D:  11/22/2005  T:  11/23/2005  Job:  [id number]

## 2011-02-04 NOTE — Cardiovascular Report (Signed)
[name], [name]             ACCOUNT NO.:  [id number]   MEDICAL RECORD NO.:  [id number]          PATIENT TYPE:  OIB   LOCATION:  2807                         FACILITY:  [facility]   PHYSICIAN:  Francisca December, M.D.  DATE OF BIRTH:  [birth year]   DATE OF PROCEDURE:  11/23/2006  DATE OF DISCHARGE:                            CARDIAC CATHETERIZATION   PROCEDURES PERFORMED:  1. Left heart catheterization.  2. Left ventriculogram.  3. Coronary angiography.  4. PCI/drug-eluting stent implantation proximal anterior descending      artery.   INDICATIONS:  [name] is a 77 year old woman with known coronary  vascular disease.  She is S/P DE stent implantation x2 in the mid-LAD,  November 15, 2005.  She has redeveloped a typical anginal syndrome.  A  recent myocardial perfusion study with exercise documented angina with  exercise and ischemic electrocardiographic changes.  Interestingly, the  perfusion images were normal.  Because of her very typical symptoms, the  ischemic electrocardiographic changes, as well as her history, she is  brought to the catheterization laboratory at this time to identify the  extent of disease and provide for further therapeutic options.   PROCEDURE NOTE:  The patient is brought to cardiac catheterization  laboratory in a fasting state.  The right groin was prepped and draped  in the usual sterile fashion.  Local anesthesia was obtained with the  infiltration of 1% lidocaine.  A 5-French catheter sheath was inserted  percutaneously into the right femoral artery utilizing an anterior  approach over a guiding J-wire.  Left heart catheterization and coronary  angiography then proceed in a standard fashion using 5-French #4 left  and right Judkins catheters and a 110-cm pigtail catheter.  I was not  able to selectively engage the anterior descending artery with the #4  left Judkins.  I was partially successful with a 3.5 left Judkins.  At  the completion  of the coronary angiography, I then proceeded with  coronary intervention.   The 5-French catheter sheath was exchanged for a 6-French catheter  sheath.  A 3.5 CLS guiding catheter was advanced to the ascending aorta,  where the left coronary os was engaged.  There was a very short left  main and I was finally able to select the anterior descending artery.  The patient received 0.75 mg/kg bolus of bivalirudin, followed by 1.75  mg/kg/hr constant infusion.  The resultant ACT was 354 seconds.  A 0.014  inches Luge intracoronary guidewire was passed across the lesion without  difficulty.  The lesion was primarily stented using a 3 x 20-mm Scimed  TAXUS intracoronary drug-eluting stent.  This was carefully positioned  and deployed at peak pressure of 14 atmospheres for less than 1 minute.  The stent balloon was deflated and removed and post dilatation performed  with a 3.25 x 15-mm Scimed Quantum Maverick intracoronary balloon.  This  was inflated to 16 atmospheres in two positions for less of 1 minute.  I  followed this procedure with an intravascular ultrasound using a Sci-Med  Atlantis catheter.  A single mechanical pullback was obtained and images  produced throughout  the stented segment.  These were analyzed and are  reported below.  The guidewire was then removed.  Cineangiography was  performed in orthogonal views.  The guiding catheter was then removed.  A right femoral arteriogram in the 45 degrees RAO angulation via the  catheter sheath by hand injection documented adequate anatomy for  placement of the percutaneous closure device, Angio-Seal.  This was  subsequently deployed with good hemostasis and an intact distal pulse.  The patient was transported to the recovery area in stable condition.   HEMODYNAMIC RESULTS:  Systemic arterial pressure was 130/58 with a mean  of 86-mmHg.  There was no systolic gradient across the aortic valve.  The left ventricular end-diastolic pressure  was 19-mmHg pre-  ventriculogram.   ANGIOGRAPHY:  The left ventriculogram demonstrated concentric  hypertrophy and hyperdynamic systolic function.  However, ventricular  tachycardia was produced during the injection.  There are no regional  wall motion abnormalities.  There was moderate mitral regurgitation  seen.  The aortic valve is trileaflet and opens normally during systole.   There is a right-dominant coronary system present.  The main left  coronary is short and normal.   The left anterior descending artery and its branches are highly  diseased; the vessel contains a restenotic lesion within the proximal  portion of the stented segment which is relatively focal and 80%  stenotic.  Just distal to the lesion, approximately 5-mm distally, there  is a focal 50% stenosis.  This is also a restenotic lesion within the  previously stented segment.  The remainder of the stented segment  appears widely patent.  The ongoing anterior descending artery reaches  and traverses the apex.  It gives rise to a moderate-sized diagonal  branch that has a 50% stenosis at its origin.   The left circumflex coronary artery and its branches are minimally  diseased; the vessel contains a proximal 30% tubular stenosis.  This is  at the origin of two small marginal branches.  The third marginal branch  is large and contains a 40% stenosis at its origin.  The ongoing  anterior descending artery then gives rise to a very large obtuse  marginal branch which demonstrates no obstruction.   The right coronary and its branches are highly diseased; the vessel  contains a diffuse 50-60% narrowing which begins in the proximal portion  and end in the mid portion of the right coronary.  The ongoing right  coronary gives rise to a large posterior descending artery and a small  posterolateral segment with a single small left ventricular branch. There are no obstructions in the distal branches or in the distal   portion of the right coronary.   Collateral vessels are not seen.   Following balloon dilatation and stent implantation in the proximal  anterior descending artery, there is no residual stenosis.   INTRAVASCULAR ULTRASOUND:  This demonstrated a 2.4 x 2.7-mm lumen  distally beyond the new stented segment.  Within the mid portion of the  new stented segment, the internal dimensions are 2.6 x 3.1-mm and at  another more proximal site 2.72 x 3.1-mm.  The proximal reference vessel  was 3.4 x 2.9-mm.   FINAL IMPRESSION:  1. Atherosclerotic cardiovascular disease, two-vessel.  2. Intact ventricular size and global systolic function, ejection      fraction 75%.  3. Hyperdynamic left ventricular systolic function with hypertrophy      seen.  This is in the setting of known mild  HOCM.  4. Status be successful percutaneous coronary intervention/drug-      eluting stent implantation in the proximal anterior descending      artery.      Francisca December, M.D.  Electronically Signed     JHE/MEDQ  D:  11/23/2006  T:  11/23/2006  Job:  [id number]   cc:   Bryan Lemma. Manus Gunning, M.D.

## 2011-02-04 NOTE — H&P (Signed)
[name], [name] NO.:  [id number]   MEDICAL RECORD NO.:  [id number]          PATIENT TYPE:  OIB   LOCATION:  2899                         FACILITY:  [facility]   PHYSICIAN:  [name], M.D.  DATE OF BIRTH:  October 14, 1932   DATE OF ADMISSION:  11/23/2006  DATE OF DISCHARGE:                              HISTORY & PHYSICAL   PRIMARY CARE PHYSICIAN:  [name], M.D.   CARDIOLOGIST:  [name], M.D.   HISTORY OF PRESENT ILLNESS:  [name] is a 77 year old Caucasian  female with a known history of two-vessel ASCVD status post drug-eluting  stents x2 to the mid LAD secondary to an acute wall myocardial  infarction in February 2007.  She also has a history of mild HOCM via 2-  D echocardiogram, dyslipidemia, and severe mitral regurgitation.  On  today, she is being admitted to the [facility] for diagnostic  catheterization with possible PCI secondary to an abnormal stress test  that was positive for angina and EKG changes during exercise.  This  stress test was performed secondary to the patient's complaints of chest  discomfort, dyspnea on exertion, and fatigue.  On today.  She denies  chest pain, dyspnea on exertion, palpitations, diaphoresis, or  dizziness.   PAST MEDICAL HISTORY:  1. Two-vessel ASCVD, status post PCI/drug-eluting stent implantation      x2 to the mid LAD November 15, 2005.  2. Status post acute wall myocardial infarction February 2007.  3. Normal LV systolic function with an EF of 65-70% via 2-D      echocardiogram November 07, 2006. 4.  Mild-to-moderate hypertrophic      obstructive cardiomyopathy via 2-D echocardiogram February 2008.  4. Severe mitral regurgitation.  5. Hyperlipidemia, well controlled.  6. IV CONTRAST DYE ALLERGY.  7. Multiple medication sensitivities including multiple STATINS.  8. Significant cough on ACE and ARB.  9. Status post appendectomy.  10.Status post tubal ligation.   ALLERGIES:  1.  STATINS  2. IODINE.  3. Cough on ACE and ARB.  4. DEMEROL.  5. MORPHINE SULFATE.  6. DARVOCET.  7. CODEINE - hallucinations.  8. CELEBREX.  9. FOSAMAX - leg pain.   CURRENT MEDICATIONS:  1. Vytorin 10/20 mg daily.  2. Multivitamin 1 tablet daily.  3. Aspirin 325 mg daily.  4. Plavix 75 mg daily.  5. Metoprolol 50 mg three times daily.  6. Gaviscon as needed.  7. Atacand 4 mg daily.  8. Omacor 1000 mg twice daily.  9. Evista 60 mg daily.   FAMILY HISTORY:  Insignificant for early coronary artery disease.   SOCIAL HISTORY:  Married with two adult children.  Lives with spouse.  Retired Visual merchandiser.  Denies tobacco, alcohol, or illicit drug use.   REVIEW OF SYSTEMS:  All other systems reviewed are negative other than  what is stated in the HPI.   PHYSICAL EXAM:  GENERAL:  A 77 year old female, pleasant and  cooperative, NAD.  VITALS:  Temperature 96, pulse 68, blood pressure 162/78, O2 saturations  99% over room air.  HEENT:  Unremarkable.  NECK:  Supple without JVD  or bilateral carotid bruits.  Carotid  upstrokes 2+.  PULMONARY:  Breath sounds are equal and clear to auscultation  bilaterally.  No use of accessory muscles.  CV:  Regular rate and rhythm.  Normal S1 and S2 with a 3/6 systolic  murmur that radiates to the carotids.  ABDOMEN:  Benign.  Extremities:  No peripheral edema.  The DP and femoral pulses 2+/2  bilaterally.  GROIN:  No femoral bruits bilaterally.  SKIN:  Warm and dry without rashes or lesions.  NEURO:  No focal motor or sensory deficits.  PSYCH:  Normal mood and affect.   LABORATORY DATA:  Sodium 138, potassium 4.8, chloride 101, CO2 27, BUN  16, creatinine 0.8, glucose 159, PT 12.1, INR 1.  White blood count 7.3,  hemoglobin 13, hematocrit 38.6, platelets 106,000, November 20, 2006.   Exercise stress test positive for angina and EKG changes during exercise  November 20, 2006.   ASSESSMENT:  1. Two-vessel arteriosclerotic cardiovascular disease,  status post      percutaneous coronary intervention/drug-eluting stents x2 to the      mid left anterior descending.  2. Mild hypertrophic obstructive cardiomyopathy via 2-D      echocardiogram.  3. Moderate mitral regurgitation.  4. Dyslipidemia.  5. Normal left ventricular systolic function with an ejection fraction      of 65-70% via 2-D echocardiogram November 07, 2006.  6. Graded exercise test positive for angina and electrocardiogram      changes during exercise November 20, 2006.  7. Thrombocytopenia.  8. Otherwise as stated in the past medical history.   PLAN:  Cardiac catheterization with possible PCI to be performed by Dr.  Corliss [name] on today at 2 o'clock p.m.  The cardiac catheterization  procedure, potential risks, and potential complications have been  explained to the patient  in detail including CVA, MI, death, renal insufficiency, IV CONTRAST DYE  ALLERGY, and vascular complications including bleeding, hematoma, and  pseudoaneurysm.  The patient admits to full understanding of the  information and wishes to proceed.      [name], [place]      [name], M.D.  Electronically Signed    RDM/MEDQ  D:  11/23/2006  T:  11/23/2006  Job:  [id number]   cc:   [name], M.D. [name]

## 2011-02-04 NOTE — Discharge Summary (Signed)
NAMEFLORINDA, [name]             ACCOUNT NO.:  [id number]   MEDICAL RECORD NO.:  [id number]          PATIENT TYPE:  INP   LOCATION:  6526                         FACILITY:  [facility]   PHYSICIAN:  Francisca December, M.D.  DATE OF BIRTH:  Dec 27, 1932   DATE OF ADMISSION:  11/14/2005  DATE OF DISCHARGE:  11/16/2005                                 DISCHARGE SUMMARY   ADMISSION DIAGNOSIS:  Recent Q wave myocardial infarction and post-  infarction angina.   DISCHARGE DIAGNOSES:  1.  Coronary artery disease status post recent Q wave myocardial infarction      and post-infarction angina status post cardiac catheterization and stent      implantation on November 15, 2005. Stable without angina or shortness of      breath.  2.  Dyslipidemia.  3.  Hyponatremia.  4.  Leukocytosis without hematoma.  5.  Moderate to severe mitral regurgitation.  6.  Multiple drug sensitivities, including a contrast dye allergy.   PROCEDURES:  Cardiac catheterization on November 15, 2005.   HOSPITAL COURSE:  [name] is a 77 year old woman, prior cardiac history,  who was admitted to the hospital on November 14, 2005. 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 She admitted to fatigue, as well as some  mild dyspnea. She also admitted to an elevated heart rate for her, which was  about 95 beats per minute.   Upon presenting to her primary care physician's office, an EKG was performed  and showed Q waves in leads V1, V2 and V3 with nonspecific ST and T wave  abnormalities and a leftward axis. A 2-D echocardiogram was obtained because  of a murmur that was heard on physical exam and showed an outflow  obstruction with moderate to severe mitral regurgitation in the apical and  mid to distal septal akinesis, severe hypokinesis. Dr. Amil Amen was consulted  and  became very concerned that the wall motion abnormality and the Q waves  were most likely a recent MI and that her symptoms were most synonymous with  post-infarction angina.   She was admitted to hospital and set up for a cardiac catheterization within  the next 24 hours. Serial cardiac enzymes were obtained, and troponin I was  0.19, 0.15, 0.09, 0.09 and 0.16 respectively. CK-MB was 5.8, 6.6, 5.0, 3.1,  2.5 and 5.7 respectively. The patient underwent cardiac catheterization on  November 15, 2005, and the findings were as follows: Left circumflex no  significant stenosis. RCA 50%-70% large mid. LAD 95% occlusion with a Taxus  stent implantation. The procedure was well tolerated. Good hemostasis was  obtained with Angio-Seal. The right groin is stable without hematoma;  however, some minimal oozing has been noticed today, and the patient has  been given medication with pressure dressings. The patient has been on bed  rest; however, once the bleeding stops she will be ambulated as part of the  normal protocol. If the patient is able  to ambulate without angina or  shortness of breath and is in stable condition, she will be discharged to  home today.   LABS:  Laboratory data as of November 16, 2005. Sodium 133, potassium 3.8,  chloride 104, CO2 23, glucose 172, BUN 19, creatinine 0.9. AST 26, ALT 18.  White blood cells 12.6, hemoglobin 13.1, hematocrit 37.7, platelets 241.  Fasting lipid panel - Total cholesterol 213, triglycerides 149, HDL 46, LDL  137. Serial cardiac enzymes as stated in the hospital course.   X-RAYS:  Chest x-ray, 2 view, November 11, 2005, prominent LV, but no  congestive heart failure, chronic lung changes with no acute process.   EKG:  EKG, November 15, 2005, normal sinus with nonspecific T wave changes,  septal Q waves. EKG on November 16, 2005, normal sinus rhythm with a heart  rate of 65 BPM.   DISCHARGE MEDICATIONS:  1.  Vytorin 10/20 mg daily.  2.  Aspirin  325 mg daily, this represents a dosage increase from 81 mg.  3.  Altace 2.5 mg daily, this is a new prescription with a prescription      given with 6 refills.  4.  Lopressor 12.5 mg twice daily, this represents a new prescription with a      prescription given with 6 refills.  5.  Plavix 75 mg daily, this represents a new prescription with a      prescription given with 11 refills.   DISCHARGE INSTRUCTIONS:  The patient has been instructed to avoid driving  for 2 weeks. The patient has been instructed to avoid lifting for 6-8 weeks.  The patient has been restricted to a heart healthy diet to adhere to a low-  salt, low-cholesterol, low-fat diet. The patient has been instructed to  increase her activities slowly. The patient has been instructed to call the  MD with recurrent angina or shortness of breath. The patient has been  scheduled for a follow-up visit with Dr. Amil Amen on December 01, 2005, at 12:40  p.m. She should call [phone] if she is unable to make this appointment.      Tylene Fantasia, [place]      Francisca December, M.D.  Electronically Signed    RDM/MEDQ  D:  11/16/2005  T:  11/16/2005  Job:  [id number]   cc:   Francisca December, M.D.  Fax: [phone]   Bryan Lemma. Manus Gunning, M.D.  Fax: [phone]

## 2011-02-04 NOTE — Cardiovascular Report (Signed)
[name], [name]             ACCOUNT NO.:  [id number]   MEDICAL RECORD NO.:  [id number]          PATIENT TYPE:  INP   LOCATION:  6526                         FACILITY:  [facility]   PHYSICIAN:  Francisca December, M.D.  DATE OF BIRTH:  1933/03/18   DATE OF PROCEDURE:  11/15/2005  DATE OF DISCHARGE:                              CARDIAC CATHETERIZATION   PROCEDURES PERFORMED:  1.  Right and left heart catheterization.  2.  Left ventriculogram.  3.  Coronary angiography.  4.  PCI/drug-eluting stent implantation mid-left anterior descending artery.  5.  Percutaneous closure right femoral artery.   INDICATIONS:  [name] is a 77 year old woman who developed chest  discomfort about four to five days ago. It developed into a rather typical  anginal syndrome. She presented to my office with Q-waves in V2 in V3 on the  ECG which were new. An echocardiogram revealed apical akinesis and moderate  mitral regurgitation. She was admitted and placed on subcutaneous Lovenox as  well as aspirin. CK-MB and troponin have been minimally elevated. She is  brought to the catheterization laboratory at this time to identify the  extent of disease and advised for further therapeutic options. She will  undergo right heart catheterization due to the presence of significant  mitral regurgitation.   PROCEDURE NOTE:  The patient is brought to the cardiac catheterization  laboratory in the fasting state. The right groin was prepped and draped in  the usual sterile fashion. Local anesthesia was obtained with infiltration  of 1% lidocaine. A 6-French catheter sheath was inserted percutaneously into  the right femoral vein utilizing an anterior approach over a guiding J-wire.  In a similar fashion, a 6-French catheter sheath was inserted into the right  femoral artery. A 6-French 100 cm multipurpose A1 catheter was advanced  through the right heart chambers to the pulmonary artery wedge position.  Pressures  was recorded to the catheter in the pulmonary artery wedge  position, the main pulmonary artery, the right ventricle, and the right  atrium. Blood samples for oxygen saturation determination were obtained from  the superior vena cava and the main pulmonary artery. The right heart  catheter was then removed. A 110 cm pigtail catheter was advanced to the  descending aorta where the pressure was recorded and a sample obtained for  blood oxygen saturation. It was then prolapsed across the aortic valve.  Pressure again recorded and left ventriculogram was performed in a 30 degree  RAO angulation. 39 cc of contrast were injected at 13 cc per second.  Pressures was then recorded as the catheter was withdrawn back across the  aortic valve. The pigtail catheter was then exchanged for a 6-French #4 left  Judkins catheter. Following the intracoronary administration of 0.2  milligrams nitroglycerin, cineangiography of the left coronary artery was  conducted in the LAO and RAO projections. The left Judkins catheter was then  exchanged for a 6-French #4 right Judkins catheter. Cineangiography of the  right coronary artery was conducted in LAO and RAO projections.   We then prepared for coronary intervention. The patient received 3000 units  of  heparin as well as a double bolus and constant infusion of Integrilin.  The resultant ACT was 189 seconds and she therefore received another 1500  units of heparin. The resultant ACT was 280 seconds. A 6-French number 3.5  CLS guiding catheter was advanced to the descending aorta where the left  coronary os was engaged. A 0.014 inch Luge intracoronary guidewire was  passed across the lesion in the mid to distal LAD without difficulty. The  lesion was primarily stented using a 2.75/16 mm Scimed Taxus intracoronary  drug-eluting stent. This carefully placed across the unstable appearing  lesion in the mid-LAD and inflated to a peak pressure of 14 atmospheres for   approximately one minute. The balloon was deflated and removed and there  appeared to be extensive distal spasm extending for approximately 10-15 mm.  I therefore removed the stent balloon and advanced a transit catheter. Via  the transit catheter, I administered 200 mcg of verapamil and 150 mcg of  nitroglycerin. The transit catheter was removed after replacing the  guidewire. Cineangiography revealed some improvement in this degree of spasm  but there was a fixed lesion of approximately 10 mm from the distal limb to  the previously stented segment. It was just after the origin of a moderate-  sized diagonal branch. It had not been appreciated earlier due to  overlapping vessels. A second stent was then advanced through the first  stent. Again this was a 2.75/20 mm Scimed Taxus device. It was inflated such  that the previously placed stent had covered the distal lesion. The peak  pressure was 14 atmospheres. Upon deflation and removal of the balloon,  there was stent jail of the diagonal. The wire was therefore withdrawn and  advanced in the diagonal without difficulty. A 2.5/15 mm Maverick  intracoronary balloon was advanced across the origin of the diagonal and  inflated to 6 atmospheres for not greater than one minute. This balloon was  deflated and removed. The degree of stenosis was reduced to approximately  50%. However, there was significant deformation of the previously placed  stent. I therefore returned the previously used to stent balloon to the  artery after advancing the wire again down the LAD. I inflated the balloon  to 4 atmospheres. I then removed it. The LAD then had a 10% residual  stenosis and the diagonal branch an 80% residual stenosis. The guidewire was  then removed and cineangiography performed in LAO and RAO projections. The  guiding catheter was then removed. A right femoral arteriogram in the 45 degrees RAO angulation demonstrated adequate anatomy for placement  of  percutaneous closure device AngioSeal. This was subsequently deployed with  good hemostasis and intact distal pulse. The venous sheath was removed at  the time as well, pressure held for approximately 10 minutes with good  hemostasis. The patient was then transported to the recovery area in stable  condition with intact distal pulse.   HEMODYNAMIC RESULTS:  Utilizing the Fick principle and an estimated oxygen  consumption of 157 mL per minute, the cardiac output was 2.5 liters per  minute and index 1.6 liters per minute per meter squared. The pulmonary  artery saturation was 64%. The superior vena cava saturation was 55%. The  aortic saturation was 98%. Right heart pressures were as follows: Right  atrial pressure: A-wave 9 mmHg, V-wave 7 mmHg, mean 6 mmHg. Right  ventricular pressure was 30/8 mmHg. Pulmonary artery pressure was 31/17 with  a mean of 32 mmHg. Pulmonary catheter wedge  pressure was A-wave 17 mmHg, V-  wave 15 mmHg, mean 13 mmHg. Left ventricular end-diastolic pressure was 16  mmHg. Systemic aortic pressure was 110/60 mmHg with a mean of 80 mmHg. There  was no systolic gradient across the aortic valve.   ANGIOGRAPHY:  The left ventriculogram was suboptimal due to significant  ventricular tachycardia; however, it was clear that there was anterolateral  akinesis, apical akinesis and mild inferoapical dyskinesis. There was 2+  mitral regurgitation present, although this was difficult to quantify due to  the ventricular tachycardia. The aortic valve was trileaflet and opened  normally during systole. There was no significant coronary calcification.   There was a right dominant coronary system present. The main left coronary  artery was normal.   Left anterior descending artery and its branches were highly diseased; the  vessel is without significant obstruction until after the first septal  perforator which is relatively small. There is a acentric 95% stenosis. The   lesion that extends being approximately 30-40% stenotic until after the  origin of a large anatomical third diagonal branch, but it is the first  large diagonal branch. Just beyond this branch vessel, there is a 90% focal  stenosis. The lesion does not appear to extend into the diagonal branch.   The left circumflex coronary artery and its branches were moderately  diseased; there is a proximal tubular 30% stenosis. Two very small marginal  branches arise and then the third marginal is large in size and it has a 50%  narrowing at its origin. The ongoing circumflex then gives rise to a large  posterolateral branch on the obtuse margin of the heart. There are no distal  obstructions in the circumflex.   The right coronary artery and its branches are highly diseased as well.  There is a tubular to diffuse 50-70% narrowing in the midportion. Distally,  the vessel has no obstruction and gives rise to moderate-sized posterior descending artery and a small posterolateral segment with one small left  ventricular branch.   Collateral vessels were not seen.   Following balloon dilatation and stent implantation, there were a 10%  residual stenosis in the LAD at the site just distal to the diagonal branch.  There is a residual 80% stenosis at the origin of the diagonal branch due to  stent jail.  Due to its hazy appearance an eccentric nature, the more  proximal portion of this long lesion in the LAD was likely the culprit for  her recent myocardial infarction and unstable angina.   FINAL IMPRESSION:  1.  Atherosclerotic cardiovascular disease, two-vessel.  2.  Mildly decreased left ventricular systolic function, ejection fraction      45% with regional wall motion abnormalities noted.  3.  Moderate mitral regurgitation.  4.  Normal right heart pressures.  5.  Status post successful percutaneous coronary intervention/drug-eluting      stent implantation mid-left anterior descending artery.  6.   Residual stent jail moderate-sized diagonal branch.      Francisca December, M.D.  Electronically Signed     JHE/MEDQ  D:  11/15/2005  T:  11/15/2005  Job:  [id number]   cc:   Bryan Lemma. Manus Gunning, M.D.  Fax: [phone]

## 2011-06-13 LAB — I-STAT 8, (EC8 V) (CONVERTED LAB)
BUN: 18
Bicarbonate: 24.5 — ABNORMAL HIGH
HCT: 34 — ABNORMAL LOW
Hemoglobin: 11.6 — ABNORMAL LOW
Operator id: 265201
Sodium: 139
TCO2: 26
pCO2, Ven: 39.7 — ABNORMAL LOW

## 2011-06-13 LAB — POCT CARDIAC MARKERS
CKMB, poc: 1.9
Myoglobin, poc: 80.4
Troponin i, poc: <0.05

## 2011-06-13 LAB — POCT I-STAT CREATININE: Creatinine, Ser: 1.1

## 2012-01-06 ENCOUNTER — Other Ambulatory Visit (HOSPITAL_COMMUNITY): Payer: Self-pay | Admitting: Family Medicine

## 2012-01-06 DIAGNOSIS — Z1231 Encounter for screening mammogram for malignant neoplasm of breast: Secondary | ICD-10-CM

## 2012-02-20 ENCOUNTER — Ambulatory Visit (HOSPITAL_COMMUNITY)
Admission: RE | Admit: 2012-02-20 | Discharge: 2012-02-20 | Disposition: A | Discharge status: Home / Self Care | Payer: Medicare Other | Source: Ambulatory Visit | Attending: Family Medicine | Admitting: Family Medicine

## 2012-02-20 DIAGNOSIS — Z1231 Encounter for screening mammogram for malignant neoplasm of breast: Secondary | ICD-10-CM | POA: Insufficient documentation

## 2012-07-31 ENCOUNTER — Telehealth: Payer: Self-pay | Admitting: Oncology

## 2012-07-31 ENCOUNTER — Encounter: Payer: Self-pay | Admitting: Oncology

## 2012-07-31 DIAGNOSIS — D539 Nutritional anemia, unspecified: Secondary | ICD-10-CM | POA: Insufficient documentation

## 2012-07-31 DIAGNOSIS — D696 Thrombocytopenia, unspecified: Secondary | ICD-10-CM | POA: Insufficient documentation

## 2012-07-31 DIAGNOSIS — R5383 Other fatigue: Secondary | ICD-10-CM | POA: Insufficient documentation

## 2012-07-31 NOTE — Telephone Encounter (Signed)
C/D 07/31/12 for appt. 08/02/12 °

## 2012-07-31 NOTE — Telephone Encounter (Signed)
S/W pt in re NP appt 11/14 @ 3 w/ Dr. Gaylyn Rong.  Referring Dr. Merri Brunette Dx- Plts 69,000 Welcome packet given at registration.

## 2012-08-02 ENCOUNTER — Ambulatory Visit: Payer: BC Managed Care – PPO

## 2012-08-02 ENCOUNTER — Encounter: Payer: Self-pay | Admitting: Oncology

## 2012-08-02 ENCOUNTER — Other Ambulatory Visit (HOSPITAL_BASED_OUTPATIENT_CLINIC_OR_DEPARTMENT_OTHER): Payer: BC Managed Care – PPO

## 2012-08-02 ENCOUNTER — Ambulatory Visit (HOSPITAL_BASED_OUTPATIENT_CLINIC_OR_DEPARTMENT_OTHER): Payer: BC Managed Care – PPO | Admitting: Oncology

## 2012-08-02 VITALS — BP 161/85 | HR 60 | Temp 96.8°F | Resp 20 | Ht 62.0 in | Wt 138.2 lb

## 2012-08-02 DIAGNOSIS — I2510 Atherosclerotic heart disease of native coronary artery without angina pectoris: Secondary | ICD-10-CM

## 2012-08-02 DIAGNOSIS — R5383 Other fatigue: Secondary | ICD-10-CM

## 2012-08-02 DIAGNOSIS — E785 Hyperlipidemia, unspecified: Secondary | ICD-10-CM

## 2012-08-02 DIAGNOSIS — D539 Nutritional anemia, unspecified: Secondary | ICD-10-CM

## 2012-08-02 DIAGNOSIS — R5381 Other malaise: Secondary | ICD-10-CM

## 2012-08-02 DIAGNOSIS — D696 Thrombocytopenia, unspecified: Secondary | ICD-10-CM

## 2012-08-02 LAB — CBC WITH DIFFERENTIAL/PLATELET
BASO%: 0.4 % (ref 0.0–2.0)
EOS%: 1 % (ref 0.0–7.0)
HCT: 39.1 % (ref 34.8–46.6)
LYMPH%: 44 % (ref 14.0–49.7)
MCH: 30.4 pg (ref 25.1–34.0)
MCHC: 34.3 g/dL (ref 31.5–36.0)
NEUT%: 44.5 % (ref 38.4–76.8)
RBC: 4.41 10*6/uL (ref 3.70–5.45)
WBC: 6.4 10*3/uL (ref 3.9–10.3)
lymph#: 2.8 10*3/uL (ref 0.9–3.3)

## 2012-08-02 LAB — COMPREHENSIVE METABOLIC PANEL (CC13)
ALT: 23 U/L (ref 0–55)
Albumin: 4 g/dL (ref 3.5–5.0)
CO2: 27 mEq/L (ref 22–29)
Calcium: 10 mg/dL (ref 8.4–10.4)
Chloride: 104 mEq/L (ref 98–107)
Glucose: 105 mg/dl — ABNORMAL HIGH (ref 70–99)
Sodium: 137 mEq/L (ref 136–145)
Total Bilirubin: 0.33 mg/dL (ref 0.20–1.20)
Total Protein: 7.8 g/dL (ref 6.4–8.3)

## 2012-08-02 LAB — MORPHOLOGY

## 2012-08-02 NOTE — Patient Instructions (Addendum)
1.  Issue:  Thrombocytopenia (low  Platelet counts). 2.  Potential causes:  Low thyroid, low VitB12, medications (cholesterol meds).  Cannot rule out MDS (myelosdysplastic syndrome).  3.  Work up:  I recommend bone marrow biopsy to rule out MDS.

## 2012-08-02 NOTE — Progress Notes (Signed)
[facility]  [phone] [phone]     INITIAL HEMATOLOGY CONSULTATION    Referral MD:  Dr. Merri Brunette, M.D.   Reason for Referral: thrombocytopenia.     HPI:  [name] is a 78 year-old woman who had CAD.  She underwent initial stent placement and then CABG in 2010.  She has been on Plavix and statin.  In 2010, her plt were normal.  On 12/22/2010, WBC 5.6; Hgb 13.5; Plt 117.  On 06/21/2012, Plt were 69.  Repeat Plt on 07/20/2012 was 68.  She was thus kindly referred to the [facility] for evaluation .  [name] presented to the clinic for the first time today with her husband.  She has baseline visual acuity problem and hearing loss ever since an hemorrhagic stroke in 2010.  Otherwise, she denied fever, anorexia, weight loss, fatigue, headache, visual changes, confusion, drenching night sweats, palpable lymph node swelling, mucositis, odynophagia, dysphagia, nausea vomiting, jaundice, chest pain, palpitation, shortness of breath, dyspnea on exertion, productive cough, gum bleeding, epistaxis, hematemesis, hemoptysis, abdominal pain, abdominal swelling, early satiety, melena, hematochezia, hematuria, skin rash, spontaneous bleeding, joint swelling, joint pain, heat or cold intolerance, bowel bladder incontinence, back pain, focal motor weakness, paresthesia, depression, suicidal or homicidal ideation, feeling hopelessness.   Past Medical History  Diagnosis Date  . Thrombocytopenia   . Unspecified deficiency anemia   . Fatigue   . CAD (coronary artery disease) 2003    on Plavix.   Marland Kitchen Hemorrhagic stroke 2010  . Hypercholesteremia   . Pancreatitis 1970  :    Past Surgical History  Procedure Date  . Cardiac valve surgery 2010  . Coronary artery bypass graft 210  . Appendectomy   . Tubal ligation   . Colonoscopy     [facility], Dr. Laural Benes  :   CURRENT MEDS: Current Outpatient Prescriptions  Medication Sig Dispense Refill   . Cholecalciferol (VITAMIN D-3 PO) Take 1 tablet by mouth daily.      . clopidogrel (PLAVIX) 75 MG tablet Take 75 mg by mouth daily.      Marland Kitchen ezetimibe-simvastatin (VYTORIN) 10-20 MG per tablet Take 1 tablet by mouth at bedtime.      . fexofenadine (ALLEGRA) 180 MG tablet Take 180 mg by mouth daily.      . Multiple Vitamins-Minerals (CENTRUM CARDIO PO) Take 1 tablet by mouth daily.      Marland Kitchen omega-3 acid ethyl esters (LOVAZA) 1 G capsule Take 1 g by mouth 2 (two) times daily.      Marland Kitchen omeprazole (PRILOSEC) 40 MG capsule Take 40 mg by mouth daily.      . sotalol (BETAPACE) 160 MG tablet Take 160 mg by mouth 2 (two) times daily.          Allergies  Allergen Reactions  . Bee Venom Anaphylaxis  . Ambien (Zolpidem Tartrate) Other (See Comments)    Altered Mental Status  . Aspirin Other (See Comments)    Bleeding, aneurysm  . Celebrex (Celecoxib) Other (See Comments)    Legs hurt  . Darvocet (Propoxyphene-Acetaminophen) Other (See Comments)    Hallucinates  . Demerol (Meperidine) Hives    Welts  . Fosamax (Alendronate Sodium) Other (See Comments)    Legs hurt  . Lipitor (Atorvastatin) Other (See Comments)    Legs hurt  . Morphine And Related Other (See Comments)    Hallucinations  . Valium (Diazepam) Other (See Comments)    Altered Mental Status  . Iodine  Hives    Welts  :  Family History  Problem Relation Age of Onset  . Cancer Mother 17    colon  . Heart attack Father   . Cancer Sister     colon, cervical  . Cancer Brother     lung  :  History   Social History  . Marital Status: Married    Spouse Name: N/A    Number of Children: 2  . Years of Education: N/A   Occupational History  .      retired [employer] operational officer    Social History Main Topics  . Smoking status: Never Smoker   . Smokeless tobacco: Never Used  . Alcohol Use: No  . Drug Use: No  . Sexually Active:    Other Topics Concern  . Not on file   Social History Narrative  . No narrative on file  :  REVIEW  OF SYSTEM:  The rest of the 14-point review of sytem was negative.   Exam: ECOG 0.   General:  well-nourished woman, in no acute distress.  Eyes:  no scleral icterus.  ENT:  There were no oropharyngeal lesions.  Neck was without thyromegaly.  Lymphatics:  Negative cervical, supraclavicular or axillary adenopathy.  Respiratory: lungs were clear bilaterally without wheezing or crackles.  Cardiovascular:  Regular rate and rhythm, S1/S2, without murmur, rub or gallop.  There was no pedal edema.  GI:  abdomen was soft, flat, nontender, nondistended, without organomegaly.  Muscoloskeletal:  no spinal tenderness of palpation of vertebral spine.  Skin exam was without echymosis, petichae.  Neuro exam was nonfocal.  Patient was able to get on and off exam table without assistance.  Gait was normal.  Patient was alerted and oriented.  Attention was good.   Language was appropriate.  Mood was normal without depression.  Speech was not pressured.  Thought content was not tangential.    LABS:  Lab Results  Component Value Date   WBC 6.4 08/02/2012   HGB 13.4 08/02/2012   HCT 39.1 08/02/2012   PLT 75* 08/02/2012   GLUCOSE 184* 07/22/2009   CHOL  Value: 120        ATP III CLASSIFICATION:  <200     mg/dL   Desirable  161-096  mg/dL   Borderline High  >=045    mg/dL   High        40/05/8118   TRIG 82 07/22/2009   HDL 31* 07/22/2009   LDLCALC  Value: 73        Total Cholesterol/HDL:CHD Risk Coronary Heart Disease Risk Table                     Men   Women  1/2 Average Risk   3.4   3.3  Average Risk       5.0   4.4  2 X Average Risk   9.6   7.1  3 X Average Risk  23.4   11.0        Use the calculated Patient Ratio above and the CHD Risk Table to determine the patient's CHD Risk.        ATP III CLASSIFICATION (LDL):  <100     mg/dL   Optimal  147-829  mg/dL   Near or Above                    Optimal  130-159  mg/dL   Borderline  562-130  mg/dL   High  >865  mg/dL   Very High 04/25/5783   ALT 18 07/21/2009   AST 18  07/21/2009   NA 134* 07/22/2009   K 4.0 07/22/2009   CL 102 07/22/2009   CREATININE 0.78 07/22/2009   BUN 9 07/22/2009   CO2 25 07/22/2009   INR 1.17 07/21/2009   Blood smear review:   I personally reviewed the patient's peripheral blood smear today.  There was isocytosis.  There was no peripheral blast.  There was no schistocytosis, spherocytosis, target cell, rouleaux formation, tear drop cell.  There was no giant platelets or platelet clumps.      ASSESSMENT AND PLAN:   1.  CAD:  S/p stent placement and then CAGB in 2010.  She has been on Plavix and statin.  2.  Hyperlipidemia:  On statin. 3.  Thrombocytopenia:  Progressive since 2012.  -Potential causes:  Hypothyroidism, VitB12 deficiency, medications (cholesterol meds or Plavix).  Cannot rule out MDS (myelosdysplastic syndrome).  - Work up:  I recommend bone marrow biopsy to rule out MDS given the progressive thrombocytopenia. If bone marrow biopsy is negative, then I may consider diagnosis of exclusion of medication-induced thrombocytopenia.   Bone marrow biopsy has potential side effects which include but not limited to pain, perforation, infection, bleeding.  She expressed informed understanding and wished to proceed if she does not hypothyroidism or VitB12 deficiency.  - Treatment:  Pending work up.         Thank you for this referral.    The length of time of the face-to-face encounter was 30 minutes. More than 50% of time was spent counseling and coordination of care.

## 2012-08-06 LAB — PROTEIN ELECTROPHORESIS, SERUM
Albumin ELP: 61.4 % (ref 55.8–66.1)
Alpha-1-Globulin: 3.8 % (ref 2.9–4.9)
Alpha-2-Globulin: 8.7 % (ref 7.1–11.8)
Gamma Globulin: 15.3 % (ref 11.1–18.8)

## 2012-08-06 LAB — TSH: TSH: 1.638 u[IU]/mL (ref 0.350–4.500)

## 2012-08-13 ENCOUNTER — Telehealth: Payer: Self-pay | Admitting: *Deleted

## 2012-08-13 NOTE — Telephone Encounter (Signed)
Left Vm to remind pt she has appt here tomorrow morning and to call back if has any problems or questions.

## 2012-08-14 ENCOUNTER — Other Ambulatory Visit: Payer: BC Managed Care – PPO | Admitting: Lab

## 2012-08-14 ENCOUNTER — Other Ambulatory Visit (HOSPITAL_COMMUNITY)
Admission: RE | Admit: 2012-08-14 | Discharge: 2012-08-14 | Disposition: A | Discharge status: Home / Self Care | Payer: Medicare Other | Source: Ambulatory Visit | Attending: Oncology | Admitting: Oncology

## 2012-08-14 ENCOUNTER — Ambulatory Visit (HOSPITAL_BASED_OUTPATIENT_CLINIC_OR_DEPARTMENT_OTHER): Payer: BC Managed Care – PPO | Admitting: Oncology

## 2012-08-14 VITALS — BP 153/77 | HR 57 | Temp 97.1°F | Resp 18

## 2012-08-14 DIAGNOSIS — D696 Thrombocytopenia, unspecified: Secondary | ICD-10-CM | POA: Insufficient documentation

## 2012-08-14 LAB — CBC
MCHC: 34.2 g/dL (ref 30.0–36.0)
Platelets: 71 10*3/uL — ABNORMAL LOW (ref 150–400)
RDW: 12.8 % (ref 11.5–15.5)
WBC: 5.1 10*3/uL (ref 4.0–10.5)

## 2012-08-14 LAB — DIFFERENTIAL
Basophils Absolute: 0 10*3/uL (ref 0.0–0.1)
Eosinophils Absolute: 0.1 10*3/uL (ref 0.0–0.7)
Lymphocytes Relative: 41 % (ref 12–46)
Lymphs Abs: 2.1 10*3/uL (ref 0.7–4.0)
Monocytes Relative: 11 % (ref 3–12)

## 2012-08-14 NOTE — Patient Instructions (Addendum)
[facility] Discharge Instructions for Post Bone Marrow Procedure  Today you had a bone marrow biopsy and aspirate of the left hip.   Please keep the pressure dressing in place for at least 24 hours.  Have someone check your dressing periodically for bleeding.  If needed you can reapply a pressure dressing to the site.  Take pain medication, tylenol, as directed by Dr. Gaylyn Rong.  IF BLEEDING REOCCURS THAT SHOULD BE REPORTED IMMEDIATELY. Call the Cancer Center at [phone] if during business hours. Or report to the Emergency Room.   I have been informed and understand all the instructions given to me. I know to contact the clinic, my physician, or go to the Emergency Department if any problems should occur. I do not have any questions at this time, but understand that I may call the clinic during office hours at [phone]  should I have any questions or need assistance in obtaining follow up care.    __________________________________________  _____________  __________ Signature of Patient or Authorized Representative            Date                   Time    __________________________________________ Nurse's Signature

## 2012-08-14 NOTE — Procedures (Signed)
   [facility]  Telephone:(336) [phone] Fax:(336) [phone]   BONE MARROW BIOPSY AND ASPIRATION   INDICATION:  Progressive thrombocytopenia; ruling out early MDS.   Procedure: After obtained from consent, [name] was placed in the prone position. Time out was performed verifying correct patient and procedure. The skin overlying the left posterior crest was prepped with Betadine and draped in the usual sterile fashion. The skin and periosteum were infiltrated with 10 mL of 2% lidocaine x 4 as she was still uncomfortable after the 1st 30mL. A small puncture wound was made with #11 scalpel blade.  Bone marrow aspirate was obtained on the first pass of the aspiration needle.  Two separate core biopsy were obtained through the same incision as the first was subcentimeter.   The aspirate was sent for routine histology, flow cytometry, and cytogenetics.  Core biopsy was sent for routine histology.   [name] tolerated procedure well with minimal  blood loss and without immediate complication.   A sterile dressing was applied.        Jethro Bolus M.D. 08/14/2012

## 2012-08-14 NOTE — Progress Notes (Signed)
0900 Approximately 0830  Bone marrow biopsy completed to left hip. Patient instructed to lay on back with pressure to biopsy site. After 30 minute post biopsy VSS and patient assessed and found to be too lethargic, spouse at bedside. Patient swallowing good so juice provided to patient. Per Dr. Gaylyn Rong patient to stay till 1000 for observation.  1000 Patient reassessed with VSS. C/o dizziness when looking to her right side patient states "this sometimes happens when I am experiencing a migraine, but I'm not having one now. But I'm ok." Alert and orientated x3. Bone marrow biopsy dressing with approximately quarter size bloody drainage. Pressure dressing changed, no oozing or discharge noted. Patient and spouse educated on site care. Patient ambulated with assistance to bathroom prior to discharge. AVS provided with Bone marrow biopsy instructions and instructed to call clinic for any questions or concerns or if bleeding increases to go to ED. Patient and spouse expressed understanding. All questions answered. Patient discharged in wheelchair for safety with spouse at side. Instructed patient to return to lab for blood draw as requested by MD.

## 2012-08-14 NOTE — Progress Notes (Signed)
Please see bone marrow procedure note dated same day.

## 2012-08-15 ENCOUNTER — Other Ambulatory Visit: Payer: Self-pay | Admitting: Oncology

## 2012-08-15 ENCOUNTER — Telehealth: Payer: Self-pay | Admitting: Oncology

## 2012-08-15 DIAGNOSIS — D696 Thrombocytopenia, unspecified: Secondary | ICD-10-CM

## 2012-08-15 NOTE — Telephone Encounter (Signed)
s.w. pt and advised pt that nxt appt Dec 16th....pt awre

## 2012-08-17 ENCOUNTER — Ambulatory Visit (HOSPITAL_COMMUNITY)
Admission: RE | Admit: 2012-08-17 | Discharge: 2012-08-17 | Disposition: A | Discharge status: Home / Self Care | Payer: Medicare Other | Source: Ambulatory Visit | Attending: Oncology | Admitting: Oncology

## 2012-08-17 DIAGNOSIS — D696 Thrombocytopenia, unspecified: Secondary | ICD-10-CM | POA: Insufficient documentation

## 2012-08-17 DIAGNOSIS — K8020 Calculus of gallbladder without cholecystitis without obstruction: Secondary | ICD-10-CM | POA: Insufficient documentation

## 2012-08-22 ENCOUNTER — Telehealth: Payer: Self-pay | Admitting: *Deleted

## 2012-08-22 NOTE — Telephone Encounter (Signed)
Called pt back and informed of [place] results and note from Dr. Gaylyn Rong below.  Offered to send copy of report and she said it was not necessary.  Instructed to keep next appt on 12/16 as scheduled.   She verbalized understanding.

## 2012-08-22 NOTE — Telephone Encounter (Signed)
Pt calling for results of her Abd Ultrasound done last week.

## 2012-08-22 NOTE — Telephone Encounter (Signed)
Please let her know that her [place] did not show liver cirrhosis or enlarged spleen.  I did the [place] to see why she had thrombocytopenia.  Please mail her a report (if she does not use EPIC).    For her mild thrombocytopenia, we will continue observation for now.  In the future, if it worsens <30, we may consider Prednisone.    Thanks.

## 2012-09-03 ENCOUNTER — Telehealth: Payer: Self-pay | Admitting: Oncology

## 2012-09-03 ENCOUNTER — Ambulatory Visit (HOSPITAL_BASED_OUTPATIENT_CLINIC_OR_DEPARTMENT_OTHER): Payer: BC Managed Care – PPO | Admitting: Oncology

## 2012-09-03 VITALS — BP 137/68 | HR 62 | Temp 96.7°F | Resp 18 | Ht 62.0 in | Wt 141.5 lb

## 2012-09-03 DIAGNOSIS — D696 Thrombocytopenia, unspecified: Secondary | ICD-10-CM

## 2012-09-03 NOTE — Patient Instructions (Addendum)
1.  Diagnosis:  Thrombocytopenia (low platelet count).   2.  Bone marrow biopsy did not show sign of leukemia, lymphoma, dysplasia (myelodysplastic syndrome MDS).   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  This is a diagnosis of exclusion.  There is no test to diagnosis this entity specifically.   This is a benign condition.  There is no need to treat unless platelet <30 since there is elevated risk of bleeding with platelet <30.   There are many different therapies for ITP such as Prednisone, Rituxan, IVIg, chronic immunosuppressive meds.  3.  Recommendation.  Lab test monthly.  Follow up in about 6 months.

## 2012-09-03 NOTE — Telephone Encounter (Signed)
gv pt appt schedule for January thru June 2014 and December 2014.

## 2012-09-03 NOTE — Progress Notes (Signed)
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   CURRENT THERAPY:  Watchful observation.   INTERVAL HISTORY: [name] 78 y.o. female returns for regular follow up with her husband.  She reports chronic sinus drainage.  She denied any visible sign of bleeding.  The rest of the 14 point review of system was negative.    Past Medical History  Diagnosis Date  . Thrombocytopenia   . Unspecified deficiency anemia   . Fatigue   . CAD (coronary artery disease) 2003    on Plavix.   Marland Kitchen Hemorrhagic stroke 2010  . Hypercholesteremia   . Pancreatitis 1970    Past Surgical History  Procedure Date  . Cardiac valve surgery 2010  . Coronary artery bypass graft 210  . Appendectomy   . Tubal ligation   . Colonoscopy     [facility], Dr. Laural Benes    Current Outpatient Prescriptions  Medication Sig Dispense Refill  . Cholecalciferol (VITAMIN D-3 PO) Take 1 tablet by mouth daily.      . clopidogrel (PLAVIX) 75 MG tablet Take 75 mg by mouth daily.      Marland Kitchen ezetimibe-simvastatin (VYTORIN) 10-20 MG per tablet Take 1 tablet by mouth at bedtime.      . fexofenadine (ALLEGRA) 180 MG tablet Take 180 mg by mouth daily.      . Multiple Vitamins-Minerals (CENTRUM CARDIO PO) Take 1 tablet by mouth daily.      Marland Kitchen omega-3 acid ethyl esters (LOVAZA) 1 G capsule Take 1 g by mouth 2 (two) times daily.      Marland Kitchen omeprazole (PRILOSEC) 40 MG capsule Take 40 mg by mouth daily.      . sotalol (BETAPACE) 160 MG tablet Take 160 mg by mouth 2 (two) times daily.        ALLERGIES:  is allergic to bee venom; ambien; aspirin; celebrex; darvocet; demerol; fosamax; lipitor; morphine and related; valium; and iodine.  REVIEW OF SYSTEMS:  The rest of the 14-point review of system was negative.   Filed Vitals:   09/03/12 0922  BP: 137/68  Pulse: 62    Temp: 96.7 F (35.9 C)  Resp: 18   Wt Readings from Last 3 Encounters:  09/03/12 141 lb 8 oz (64.184 kg)  08/02/12 138 lb 3.2 oz (62.687 kg)    PHYSICAL EXAMINATION:  Deferred since there was no new complaint compared to last visit.    LABORATORY/RADIOLOGY DATA:  Lab Results  Component Value Date   WBC 5.1 08/14/2012   HGB 13.2 08/14/2012   HCT 38.6 08/14/2012   PLT 71* 08/14/2012   GLUCOSE 105* 08/02/2012   CHOL  Value: 120        ATP III CLASSIFICATION:  <200     mg/dL   Desirable  846-962  mg/dL   Borderline High  >=952    mg/dL   High        84/09/3242   TRIG 82 07/22/2009   HDL 31* 07/22/2009   LDLCALC  Value: 73        Total Cholesterol/HDL:CHD Risk Coronary Heart Disease Risk Table                     Men   Women  1/2 Average Risk   3.4   3.3  Average Risk  5.0   4.4  2 X Average Risk   9.6   7.1  3 X Average Risk  23.4   11.0        Use the calculated Patient Ratio above and the CHD Risk Table to determine the patient's CHD Risk.        ATP III CLASSIFICATION (LDL):  <100     mg/dL   Optimal  161-096  mg/dL   Near or Above                    Optimal  130-159  mg/dL   Borderline  045-409  mg/dL   High  >811     mg/dL   Very High 91/12/7827   ALKPHOS 50 08/02/2012   ALT 23 08/02/2012   AST 25 08/02/2012   NA 137 08/02/2012   K 4.5 08/02/2012   CL 104 08/02/2012   CREATININE 1.0 08/02/2012   BUN 22.0 08/02/2012   CO2 27 08/02/2012   INR 1.17 07/21/2009    ASSESSMENT AND PLAN:   1. CAD: S/p stent placement and then CAGB in 2010. She has been on Plavix and statin.  2. Hyperlipidemia: On statin.  3. Thrombocytopenia: Progressive since 2012.  -  Potential causes:  Statin and Plavix.  Cannot rule out chronic ITP.  Bone marrow biopsy showed no dysplasia; cytogenetics were normal.  Abdominal US showed no cirrhosis or splenomegaly.  - ITP is a diagnosis of exclusion.  There is no test to diagnosis this entity specifically.   This is a benign condition.  There is no need to  treat unless platelet <30 since there is elevated risk of bleeding with platelet <30.   There are many different therapies for ITP such as Prednisone, Rituxan, IVIg, chronic immunosuppressive meds.   4.  Recommendation.  No indication for treatment yet.  If her platelet continues to decrease, I may consider discussing with cardiology to see if Plavix and statin could be discontinued.   Lab test monthly.  Follow up in about 6 months.  She was OK with this plan.     The length of time of the face-to-face encounter was 15 minutes. More than 50% of time was spent counseling and coordination of care.

## 2012-10-01 ENCOUNTER — Telehealth: Payer: Self-pay | Admitting: *Deleted

## 2012-10-01 ENCOUNTER — Other Ambulatory Visit (HOSPITAL_BASED_OUTPATIENT_CLINIC_OR_DEPARTMENT_OTHER): Payer: Medicare Other

## 2012-10-01 DIAGNOSIS — D696 Thrombocytopenia, unspecified: Secondary | ICD-10-CM

## 2012-10-01 LAB — CBC WITH DIFFERENTIAL/PLATELET
Eosinophils Absolute: 0.1 10*3/uL (ref 0.0–0.5)
HCT: 38.9 % (ref 34.8–46.6)
LYMPH%: 39.2 % (ref 14.0–49.7)
MONO#: 0.7 10*3/uL (ref 0.1–0.9)
NEUT#: 3.2 10*3/uL (ref 1.5–6.5)
NEUT%: 48.3 % (ref 38.4–76.8)
Platelets: 83 10*3/uL — ABNORMAL LOW (ref 145–400)
WBC: 6.6 10*3/uL (ref 3.9–10.3)

## 2012-10-01 NOTE — Telephone Encounter (Signed)
Message copied by Reesa Chew on Mon Oct 01, 2012  4:10 PM ------      Message from: Su Hilt C      Created: Mon Oct 01, 2012  3:13 PM                   ----- Message -----         From: Exie Parody, MD         Sent: 10/01/2012   9:58 AM           To: Marlowe Aschoff, RN            Please call pt.  Her plt has slightly improved.  Her thrombocytopenia is most likely due to medications.  Continue observation.  Thanks.

## 2012-10-01 NOTE — Telephone Encounter (Signed)
Notified patient of plt count today.

## 2012-10-29 ENCOUNTER — Other Ambulatory Visit: Payer: BC Managed Care – PPO

## 2012-10-29 ENCOUNTER — Ambulatory Visit
Admission: RE | Admit: 2012-10-29 | Discharge: 2012-10-29 | Disposition: A | Discharge status: Home / Self Care | Payer: Medicare Other | Source: Ambulatory Visit | Attending: Family Medicine | Admitting: Family Medicine

## 2012-10-29 ENCOUNTER — Other Ambulatory Visit: Payer: Self-pay | Admitting: Family Medicine

## 2012-10-29 DIAGNOSIS — T148XXA Other injury of unspecified body region, initial encounter: Secondary | ICD-10-CM

## 2012-10-30 NOTE — Progress Notes (Signed)
Patient called to report that Dr. Garnette Scheuermann d/c'ed Plavik 75mg  2 weeks ago.

## 2012-10-30 NOTE — Progress Notes (Signed)
Received lab results from [facility]; forwarded to Dr. Gaylyn Rong.

## 2012-10-31 ENCOUNTER — Other Ambulatory Visit: Payer: Self-pay | Admitting: Oncology

## 2012-10-31 ENCOUNTER — Other Ambulatory Visit: Payer: Medicare Other | Admitting: Lab

## 2012-11-26 ENCOUNTER — Other Ambulatory Visit: Payer: BC Managed Care – PPO

## 2012-11-28 ENCOUNTER — Other Ambulatory Visit (HOSPITAL_BASED_OUTPATIENT_CLINIC_OR_DEPARTMENT_OTHER): Payer: Medicare Other | Admitting: Lab

## 2012-11-28 DIAGNOSIS — D696 Thrombocytopenia, unspecified: Secondary | ICD-10-CM

## 2012-11-28 LAB — CBC WITH DIFFERENTIAL/PLATELET
BASO%: 0.7 % (ref 0.0–2.0)
EOS%: 1.6 % (ref 0.0–7.0)
HCT: 35.7 % (ref 34.8–46.6)
LYMPH%: 42.5 % (ref 14.0–49.7)
MCH: 29.2 pg (ref 25.1–34.0)
MCHC: 34.1 g/dL (ref 31.5–36.0)
MONO#: 0.7 10*3/uL (ref 0.1–0.9)
NEUT%: 43.7 % (ref 38.4–76.8)
Platelets: 90 10*3/uL — ABNORMAL LOW (ref 145–400)

## 2012-11-30 ENCOUNTER — Telehealth: Payer: Self-pay

## 2012-11-30 NOTE — Telephone Encounter (Signed)
Message copied by Kallie Locks on Fri Nov 30, 2012  4:45 PM ------      Message from: HA, Raliegh Ip T      Created: Wed Nov 28, 2012  9:13 AM       Please call pt.  Her plt is still low but stable.  This is most likely medication induced. Continue observation for now.  Thanks. ------

## 2012-12-24 ENCOUNTER — Other Ambulatory Visit (HOSPITAL_BASED_OUTPATIENT_CLINIC_OR_DEPARTMENT_OTHER): Payer: Medicare Other

## 2012-12-24 DIAGNOSIS — D696 Thrombocytopenia, unspecified: Secondary | ICD-10-CM

## 2012-12-24 LAB — CBC WITH DIFFERENTIAL/PLATELET
BASO%: 0.5 % (ref 0.0–2.0)
EOS%: 0.9 % (ref 0.0–7.0)
HCT: 37.8 % (ref 34.8–46.6)
MCH: 29.8 pg (ref 25.1–34.0)
MCHC: 34.4 g/dL (ref 31.5–36.0)
MCV: 86.6 fL (ref 79.5–101.0)
MONO%: 11.4 % (ref 0.0–14.0)
NEUT%: 47.5 % (ref 38.4–76.8)
lymph#: 2.2 10*3/uL (ref 0.9–3.3)

## 2012-12-25 ENCOUNTER — Telehealth: Payer: Self-pay

## 2012-12-25 NOTE — Telephone Encounter (Signed)
Message copied by Kallie Locks on Tue Dec 25, 2012 11:11 AM ------      Message from: HA, Raliegh Ip T      Created: Mon Dec 24, 2012 11:10 AM       Please call pt.  Her plt count is still low.  However, compared to last year; not much change.  I again think this is autoimmune.  However, it's not severe enough for treatment.  I again recommend keeping an eye on this.  Keep previously arranged lab and visit.  Thanks. ------

## 2013-01-21 ENCOUNTER — Other Ambulatory Visit (HOSPITAL_BASED_OUTPATIENT_CLINIC_OR_DEPARTMENT_OTHER): Payer: Medicare Other

## 2013-01-21 ENCOUNTER — Encounter: Payer: Self-pay | Admitting: Oncology

## 2013-01-21 DIAGNOSIS — D696 Thrombocytopenia, unspecified: Secondary | ICD-10-CM

## 2013-01-21 LAB — CBC WITH DIFFERENTIAL/PLATELET
BASO%: 0.5 % (ref 0.0–2.0)
EOS%: 1.6 % (ref 0.0–7.0)
HGB: 12.7 g/dL (ref 11.6–15.9)
MCH: 28.7 pg (ref 25.1–34.0)
MCHC: 33.4 g/dL (ref 31.5–36.0)
MONO%: 11 % (ref 0.0–14.0)
RBC: 4.45 10*6/uL (ref 3.70–5.45)
RDW: 14.1 % (ref 11.2–14.5)
lymph#: 2.4 10*3/uL (ref 0.9–3.3)

## 2013-01-29 ENCOUNTER — Ambulatory Visit (INDEPENDENT_AMBULATORY_CARE_PROVIDER_SITE_OTHER): Payer: Medicare Other | Admitting: Diagnostic Neuroimaging

## 2013-01-29 ENCOUNTER — Encounter: Payer: Self-pay | Admitting: Diagnostic Neuroimaging

## 2013-01-29 VITALS — BP 155/76 | HR 57 | Temp 97.7°F | Ht 61.0 in | Wt 142.0 lb

## 2013-01-29 DIAGNOSIS — R42 Dizziness and giddiness: Secondary | ICD-10-CM

## 2013-01-29 NOTE — Patient Instructions (Addendum)
We will order MRI brain with auditory internal canal protocol and vestibular rehab.    Dizziness Dizziness is a common problem. It is a feeling of unsteadiness or lightheadedness. You may feel like you are about to faint. Dizziness can lead to injury if you stumble or fall. A person of any age group can suffer from dizziness, but dizziness is more common in older adults. CAUSES  Dizziness can be caused by many different things, including:  Middle ear problems.  Standing for too long.  Infections.  An allergic reaction.  Aging.  An emotional response to something, such as the sight of blood.  Side effects of medicines.  Fatigue.  Problems with circulation or blood pressure.  Excess use of alcohol, medicines, or illegal drug use.  Breathing too fast (hyperventilation).  An arrhythmia or problems with your heart rhythm.  Low red blood cell count (anemia).  Pregnancy.  Vomiting, diarrhea, fever, or other illnesses that cause dehydration.  Diseases or conditions such as Parkinson's disease, high blood pressure (hypertension), diabetes, and thyroid problems.  Exposure to extreme heat. DIAGNOSIS  To find the cause of your dizziness, your caregiver may do a physical exam, lab tests, radiologic imaging scans, or an electrocardiography test (ECG).  TREATMENT  Treatment of dizziness depends on the cause of your symptoms and can vary greatly. HOME CARE INSTRUCTIONS   Drink enough fluids to keep your urine clear or pale yellow. This is especially important in very hot weather. In the elderly, it is also important in cold weather.  If your dizziness is caused by medicines, take them exactly as directed. When taking blood pressure medicines, it is especially important to get up slowly.  Rise slowly from chairs and steady yourself until you feel okay.  In the morning, first sit up on the side of the bed. When this seems okay, stand slowly while holding onto something until you  know your balance is fine.  If you need to stand in one place for a long time, be sure to move your legs often. Tighten and relax the muscles in your legs while standing.  If dizziness continues to be a problem, have someone stay with you for a day or two. Do this until you feel you are well enough to stay alone. Have the person call your caregiver if he or she notices changes in you that are concerning.  Do not drive or use heavy machinery if you feel dizzy.  Do not drink alcohol. SEEK IMMEDIATE MEDICAL CARE IF:   Your dizziness or lightheadedness gets worse.  You feel nauseous or vomit.  You develop problems with talking, walking, weakness, or using your arms, hands, or legs.  You are not thinking clearly or you have difficulty forming sentences. It may take a friend or family member to determine if your thinking is normal.  You develop chest pain, abdominal pain, shortness of breath, or sweating.  Your vision changes.  You notice any bleeding.  You have side effects from medicine that seems to be getting worse rather than better. MAKE SURE YOU:   Understand these instructions.  Will watch your condition.  Will get help right away if you are not doing well or get worse. Document Released: 03/01/2001 Document Revised: 11/28/2011 Document Reviewed: 03/25/2011 [facility] Patient Information 2013 [place], [place].

## 2013-01-29 NOTE — Progress Notes (Signed)
[facility]  PATIENT: [name] DOB: 10-16-1932  REFERRING CLINICIAN: Merri Brunette HISTORY FROM: patient and spouse REASON FOR VISIT: vertigo x 4 weeks   HISTORICAL  CHIEF COMPLAINT:  Chief Complaint  Patient presents with  . Dizziness    HISTORY OF PRESENT ILLNESS: 79 year old Caucasian female who is right-handed (converted left-hander) here for evaluation of intermittent daily dizziness for the last 4 weeks.  When she is in bed and turns to the right, she gets extremely dizzy and nauseous.  She feels better if she turns over to the left or gets up out of bed. Some days she cannot walk straight and needs assistance. She has been using Meclizine with some relief.  She has had these symptoms in the past (first about 5 years ago) but it usually did not last more than three days. Patient states if she doesn't have wax cleaned out of ear, the dizziness episodes occur.  Patient states she had a slight hemorrhagic stroke (2010) during open heart surgery and went blind for a day.  She has not been on anticoagulation because of it. 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    REVIEW OF SYSTEMS: Full 14 system review of systems performed and notable only for fatigue hearing loss ringing in ears spinning sensation trouble swallowing feeling hot feeling cold memory loss confusion dizziness insomnia anxiety decreased energy.  ALLERGIES: Allergies  Allergen Reactions  . Bee Venom Anaphylaxis  . Ambien (Zolpidem Tartrate) Other (See Comments)    Altered Mental Status  . Aspirin Other (See Comments)    Bleeding, aneurysm  . Celebrex (Celecoxib) Other (See Comments)    Legs hurt  . Codeine   . Darvocet (Propoxyphene-Acetaminophen) Other (See Comments)    Hallucinates  . Demerol (Meperidine) Hives    Welts  . Fosamax (Alendronate Sodium) Other (See Comments)    Legs hurt  . Lipitor (Atorvastatin) Other (See Comments)    Legs hurt  . Morphine  And Related Other (See Comments)    Hallucinations  . Valium (Diazepam) Other (See Comments)    Altered Mental Status  . Iodine Hives    Welts    HOME MEDICATIONS: Outpatient Prescriptions Prior to Visit  Medication Sig Dispense Refill  . Cholecalciferol (VITAMIN D-3 PO) Take 1 tablet by mouth daily.      Marland Kitchen ezetimibe-simvastatin (VYTORIN) 10-20 MG per tablet Take 1 tablet by mouth at bedtime.      . fexofenadine (ALLEGRA) 180 MG tablet Take 180 mg by mouth daily.      . Multiple Vitamins-Minerals (CENTRUM CARDIO PO) Take 1 tablet by mouth daily.      Marland Kitchen omega-3 acid ethyl esters (LOVAZA) 1 G capsule Take 1 g by mouth 2 (two) times daily.      Marland Kitchen omeprazole (PRILOSEC) 40 MG capsule Take 40 mg by mouth daily.      . sotalol (BETAPACE) 160 MG tablet Take 160 mg by mouth 2 (two) times daily.       No facility-administered medications prior to visit.    PAST MEDICAL HISTORY: Past Medical History  Diagnosis Date  . Thrombocytopenia   . Unspecified deficiency anemia   . Fatigue   . CAD (coronary artery disease) 2003    on Plavix.   Marland Kitchen Hemorrhagic stroke 2010  . Hypercholesteremia   . Pancreatitis 1970    PAST SURGICAL HISTORY: Past Surgical History  Procedure Laterality Date  . Cardiac valve surgery  2010  .  Coronary artery bypass graft  210  . Appendectomy    . Tubal ligation    . Colonoscopy      [facility], Dr. Laural Benes    FAMILY HISTORY: Family History  Problem Relation Age of Onset  . Cancer Mother 47    colon  . Heart attack Father   . Cancer Sister     colon, cervical  . Cancer Brother     lung    SOCIAL HISTORY:  History   Social History  . Marital Status: Married    Spouse Name: Ed    Number of Children: 2  . Years of Education: College   Occupational History  .      retired Diplomatic Services operational officer    Social History Main Topics  . Smoking status: Never Smoker   . Smokeless tobacco: Never Used  . Alcohol Use: No  . Drug Use: No  . Sexually Active:    Other  Topics Concern  . Not on file   Social History Narrative   Pt lives at home with spouse.   Caffeine Use: Rarely     PHYSICAL EXAM  Filed Vitals:   01/29/13 0914 01/29/13 0916  BP:  155/76  Pulse:  57  Temp: 97.7 F (36.5 C)   TempSrc: Oral   Height: 5\' 1"  (1.549 m) 5\' 1"  (1.549 m)  Weight: 142 lb (64.411 kg) 142 lb (64.411 kg)   Body mass index is 26.84 kg/(m^2).  GENERAL EXAM: Patient is in no distress; DIX HALLPIKE WITH HEAD RIGHT OR LEFT TRIGGERES MILD SYMPTOMS WHEN COMING UP TO SITTING POSITION. NO SXS WITH LAYING DOWN. NO NYSTAGMUS.  CARDIOVASCULAR: Regular rate and rhythm, no murmurs, no carotid bruits  NEUROLOGIC: MENTAL STATUS: awake, alert, language fluent, comprehension intact, naming intact; TANGENTIAL. ANXIOUS APPEARING. CRANIAL NERVE: no papilledema on fundoscopic exam, pupils equal and reactive to light, visual fields full to confrontation, extraocular muscles intact, no nystagmus, facial sensation and strength symmetric, uvula midline, shoulder shrug symmetric, tongue midline. MOTOR: normal bulk and tone, full strength in the BUE, BLE SENSORY: normal and symmetric to light touch, pinprick, temperature, vibration COORDINATION: finger-nose-finger, fine finger movements normal REFLEXES: deep tendon reflexes present and symmetric; TRACE IN ANKLES. GAIT/STATION: narrow based gait; able to walk on toes, heels and tandem; romberg is negative   DIAGNOSTIC DATA (LABS, IMAGING, TESTING) - I reviewed patient records, labs, notes, testing and imaging myself where available.  Lab Results  Component Value Date   WBC 5.5 01/21/2013   HGB 12.7 01/21/2013   HCT 38.2 01/21/2013   MCV 85.9 01/21/2013   PLT 61* 01/21/2013      Component Value Date/Time   NA 137 08/02/2012 1515   NA 134* 07/22/2009 0938   K 4.5 08/02/2012 1515   K 4.0 07/22/2009 0938   CL 104 08/02/2012 1515   CL 102 07/22/2009 0938   CO2 27 08/02/2012 1515   CO2 25 07/22/2009 0938   GLUCOSE 105* 08/02/2012  1515   GLUCOSE 184* 07/22/2009 0938   BUN 22.0 08/02/2012 1515   BUN 9 07/22/2009 0938   CREATININE 1.0 08/02/2012 1515   CREATININE 0.78 07/22/2009 0938   CALCIUM 10.0 08/02/2012 1515   CALCIUM 8.7 07/22/2009 0938   PROT 7.8 08/02/2012 1515   PROT 7.1 07/21/2009 2353   ALBUMIN 4.0 08/02/2012 1515   ALBUMIN 2.9* 07/21/2009 2353   AST 25 08/02/2012 1515   AST 18 07/21/2009 2353   ALT 23 08/02/2012 1515   ALT 18 07/21/2009 2353  ALKPHOS 50 08/02/2012 1515   ALKPHOS 101 07/21/2009 2353   BILITOT 0.33 08/02/2012 1515   BILITOT 0.6 07/21/2009 2353   GFRNONAA >60 07/22/2009 0938   GFRAA  Value: >60        The eGFR has been calculated using the MDRD equation. This calculation has not been validated in all clinical situations. eGFR's persistently <60 mL/min signify possible Chronic Kidney Disease. 07/22/2009 8295   Lab Results  Component Value Date   CHOL  Value: 120        ATP III CLASSIFICATION:  <200     mg/dL   Desirable  621-308  mg/dL   Borderline High  >=657    mg/dL   High        84/02/9628   HDL 31* 07/22/2009   LDLCALC  Value: 73        Total Cholesterol/HDL:CHD Risk Coronary Heart Disease Risk Table                     Men   Women  1/2 Average Risk   3.4   3.3  Average Risk       5.0   4.4  2 X Average Risk   9.6   7.1  3 X Average Risk  23.4   11.0        Use the calculated Patient Ratio above and the CHD Risk Table to determine the patient's CHD Risk.        ATP III CLASSIFICATION (LDL):  <100     mg/dL   Optimal  528-413  mg/dL   Near or Above                    Optimal  130-159  mg/dL   Borderline  244-010  mg/dL   High  >272     mg/dL   Very High 53/02/6439   TRIG 82 07/22/2009   CHOLHDL 3.9 07/22/2009   No results found for this basename: HGBA1C   Lab Results  Component Value Date   VITAMINB12 554 08/02/2012   Lab Results  Component Value Date   TSH 1.638 08/02/2012     ASSESSMENT AND PLAN  79 y.o. year old female  has a past medical history of Thrombocytopenia; Unspecified  deficiency anemia; Fatigue; CAD (coronary artery disease) (2003); Hemorrhagic stroke (2010); Hypercholesteremia; and Pancreatitis (1970). here with intermittent positional vertigo. First symptom 5 years ago and lasting for 3 days. She's had for 5 episodes since that time. Most recent event occurred 4 weeks ago and has been persistent since that time. Some slight improvement since the onset.  PLAN: 1. We'll check MRI brain with IAC protocol 2. Vestibular rehabilitation/PT   Orders Placed This Encounter  Procedures  . MR Brain/IAC Wo/W Cm  . PT vestibular rehab     Meds ordered this encounter  Medications  . meclizine (ANTIVERT) 25 MG tablet    Sig: Take 25 mg by mouth daily as needed.     Suanne Marker, MD (with LYNN LAM NP-C 01/29/2013, 10:04 AM) Certified in Neurology, Neurophysiology and Neuroimaging  [facility] [address] [place], [place] [address] [phone]

## 2013-02-08 ENCOUNTER — Other Ambulatory Visit: Payer: Self-pay | Admitting: Oncology

## 2013-02-08 ENCOUNTER — Encounter: Payer: Self-pay | Admitting: *Deleted

## 2013-02-08 NOTE — Progress Notes (Signed)
CBC results received from [facility].  Platelet count 60.  Forwarded to Dr. Gaylyn Rong for review.

## 2013-02-12 ENCOUNTER — Telehealth: Payer: Self-pay | Admitting: *Deleted

## 2013-02-12 DIAGNOSIS — R42 Dizziness and giddiness: Secondary | ICD-10-CM

## 2013-02-12 NOTE — Telephone Encounter (Signed)
[name] called from [facility] needs new order for MRI Brain / IAC wo/w cm

## 2013-02-14 ENCOUNTER — Inpatient Hospital Stay: Admission: RE | Admit: 2013-02-14 | Payer: Medicare Other | Source: Ambulatory Visit

## 2013-02-14 ENCOUNTER — Ambulatory Visit
Admission: RE | Admit: 2013-02-14 | Discharge: 2013-02-14 | Disposition: A | Discharge status: Home / Self Care | Payer: Medicare Other | Source: Ambulatory Visit | Attending: Diagnostic Neuroimaging | Admitting: Diagnostic Neuroimaging

## 2013-02-14 DIAGNOSIS — R42 Dizziness and giddiness: Secondary | ICD-10-CM

## 2013-02-14 MED ORDER — GADOBENATE DIMEGLUMINE 529 MG/ML IV SOLN
12.0000 mL | Freq: Once | INTRAVENOUS | Status: AC | PRN
  Administered 2013-02-14: 12 mL via INTRAVENOUS

## 2013-02-25 ENCOUNTER — Other Ambulatory Visit: Payer: Self-pay | Admitting: Gastroenterology

## 2013-03-04 ENCOUNTER — Other Ambulatory Visit (HOSPITAL_BASED_OUTPATIENT_CLINIC_OR_DEPARTMENT_OTHER): Payer: Medicare Other

## 2013-03-04 ENCOUNTER — Encounter: Payer: Self-pay | Admitting: Oncology

## 2013-03-04 ENCOUNTER — Ambulatory Visit (HOSPITAL_BASED_OUTPATIENT_CLINIC_OR_DEPARTMENT_OTHER): Payer: Medicare Other | Admitting: Oncology

## 2013-03-04 ENCOUNTER — Telehealth: Payer: Self-pay | Admitting: Oncology

## 2013-03-04 VITALS — BP 140/69 | HR 69 | Temp 97.2°F | Resp 18 | Ht 61.0 in | Wt 142.0 lb

## 2013-03-04 DIAGNOSIS — D696 Thrombocytopenia, unspecified: Secondary | ICD-10-CM

## 2013-03-04 DIAGNOSIS — D539 Nutritional anemia, unspecified: Secondary | ICD-10-CM

## 2013-03-04 LAB — CBC WITH DIFFERENTIAL/PLATELET
Basophils Absolute: 0 10*3/uL (ref 0.0–0.1)
Eosinophils Absolute: 0.1 10*3/uL (ref 0.0–0.5)
HGB: 12.9 g/dL (ref 11.6–15.9)
NEUT#: 2.4 10*3/uL (ref 1.5–6.5)
RBC: 4.44 10*6/uL (ref 3.70–5.45)
RDW: 13.9 % (ref 11.2–14.5)
WBC: 5.9 10*3/uL (ref 3.9–10.3)
lymph#: 2.8 10*3/uL (ref 0.9–3.3)

## 2013-03-04 NOTE — Progress Notes (Signed)
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   CURRENT THERAPY:  Watchful observation.   INTERVAL HISTORY: [name] 79 y.o. female returns for regular follow up with her husband.  Reported recent rectal bleeding. Bleeding has stopped now. She has a colonoscopy scheduled within the next month. No other bleeding noted. The rest of the 14 point review of system was negative.    Past Medical History  Diagnosis Date  . Thrombocytopenia   . Unspecified deficiency anemia   . Fatigue   . CAD (coronary artery disease) 2003    on Plavix.   Marland Kitchen Hemorrhagic stroke 2010  . Hypercholesteremia   . Pancreatitis 1970    Past Surgical History  Procedure Laterality Date  . Cardiac valve surgery  2010  . Coronary artery bypass graft  210  . Appendectomy    . Tubal ligation    . Colonoscopy      [facility], Dr. Laural Benes    Current Outpatient Prescriptions  Medication Sig Dispense Refill  . calcium citrate (CALCITRATE - DOSED IN MG ELEMENTAL CALCIUM) 950 MG tablet Take 1 tablet by mouth daily.      Marland Kitchen ezetimibe (ZETIA) 10 MG tablet Take 10 mg by mouth daily.      . Cholecalciferol (VITAMIN D-3 PO) Take 1 tablet by mouth daily.      . fexofenadine (ALLEGRA) 180 MG tablet Take 180 mg by mouth daily.      . meclizine (ANTIVERT) 25 MG tablet Take 25 mg by mouth daily as needed.      . Multiple Vitamins-Minerals (CENTRUM CARDIO PO) Take 1 tablet by mouth daily.      Marland Kitchen omega-3 acid ethyl esters (LOVAZA) 1 G capsule Take 1 g by mouth 2 (two) times daily.      Marland Kitchen omeprazole (PRILOSEC) 40 MG capsule Take 40 mg by mouth daily.      . sotalol (BETAPACE) 160 MG tablet Take 160 mg by mouth 2 (two) times daily.       No current facility-administered medications for this visit.    ALLERGIES:  is allergic to  bee venom; ambien; aspirin; celebrex; codeine; darvocet; demerol; fosamax; lipitor; morphine and related; valium; and iodine.  REVIEW OF SYSTEMS:  The rest of the 14-point review of system was negative.   Filed Vitals:   03/04/13 0926  BP: 140/69  Pulse: 69  Temp: 97.2 F (36.2 C)  Resp: 18   Wt Readings from Last 3 Encounters:  03/04/13 142 lb (64.411 kg)  01/29/13 142 lb (64.411 kg)  09/03/12 141 lb 8 oz (64.184 kg)    PHYSICAL EXAMINATION:  Deferred since there was no new complaint compared to last visit.    LABORATORY/RADIOLOGY DATA:  Lab Results  Component Value Date   WBC 5.9 03/04/2013   HGB 12.9 03/04/2013   HCT 37.8 03/04/2013   PLT 64* 03/04/2013   GLUCOSE 105* 08/02/2012   CHOL  Value: 120        ATP III CLASSIFICATION:  <200     mg/dL   Desirable  119-147  mg/dL   Borderline High  >=829    mg/dL   High        56/10/1306   TRIG 82 07/22/2009   HDL 31* 07/22/2009   LDLCALC  Value: 73  Total Cholesterol/HDL:CHD Risk Coronary Heart Disease Risk Table                     Men   Women  1/2 Average Risk   3.4   3.3  Average Risk       5.0   4.4  2 X Average Risk   9.6   7.1  3 X Average Risk  23.4   11.0        Use the calculated Patient Ratio above and the CHD Risk Table to determine the patient's CHD Risk.        ATP III CLASSIFICATION (LDL):  <100     mg/dL   Optimal  161-096  mg/dL   Near or Above                    Optimal  130-159  mg/dL   Borderline  045-409  mg/dL   High  >811     mg/dL   Very High 91/12/7827   ALKPHOS 50 08/02/2012   ALT 23 08/02/2012   AST 25 08/02/2012   NA 137 08/02/2012   K 4.5 08/02/2012   CL 104 08/02/2012   CREATININE 1.0 08/02/2012   BUN 22.0 08/02/2012   CO2 27 08/02/2012   INR 1.17 07/21/2009    ASSESSMENT AND PLAN:   1. CAD: S/p stent placement and then CAGB in 2010. She is now off Plavix. On Zetia per Cardiology.  2. Hyperlipidemia: On Zetia.   3. Thrombocytopenia: Progressive since 2012.  -  Potential causes:  Cannot rule out  chronic ITP.  Bone marrow biopsy showed no dysplasia; cytogenetics were normal.  Abdominal US showed no cirrhosis or splenomegaly.  - ITP is a diagnosis of exclusion.  There is no test to diagnosis this entity specifically.   This is a benign condition.  There is no need to treat unless platelet <30 since there is elevated risk of bleeding with platelet <30.   There are many different therapies for ITP such as Prednisone, Rituxan, IVIg, chronic immunosuppressive meds.   4.  Recommendation.  No indication for treatment yet. I have recommended lab test monthly, however, the patient and husband do not want to come to the office that frequently. We have compromised and scheduled labs for every other month.  Follow up in about 6 months.  She was OK with this plan.     The length of time of the face-to-face encounter was 15 minutes. More than 50% of time was spent counseling and coordination of care.

## 2013-03-13 ENCOUNTER — Telehealth: Payer: Self-pay | Admitting: Diagnostic Neuroimaging

## 2013-03-14 NOTE — Telephone Encounter (Signed)
Called patient left voicemail letting her know  we received her phone call. And has sent her requesting  to the doctor and he will be getting in touch with her shortly with results.

## 2013-03-19 ENCOUNTER — Encounter (HOSPITAL_COMMUNITY): Payer: Self-pay | Admitting: Pharmacy Technician

## 2013-03-20 ENCOUNTER — Encounter (HOSPITAL_COMMUNITY): Payer: Self-pay | Admitting: *Deleted

## 2013-03-21 NOTE — Progress Notes (Addendum)
Your procedure is scheduled on:04-09-2013 Report to [facility] Admitting at: 0730 AM Call this number if you have problems morning of your [phone]  Follow all bowel prep instructions per your doctor's orders.  Do not eat or drink anything after midnight the night before your procedure. You may brush your teeth, rinse out your mouth, but no water, no food, no chewing gum, no mints, no candies, no chewing tobacco.     Take these medicines the morning of your procedure with A SIP OF WATER: Prilosec, allegra, sotalol (betapace)   Please make arrangements for a responsible person to drive you home after the procedure. You cannot go home by cab/taxi. We recommend you have someone with you at home the first 24 hours after your procedure. Driver for procedure is please arrange driver  LEAVE ALL VALUABLES, JEWELRY, BILLFOLD AT HOME.  NO DENTURES, CONTACT LENSES ALLOWED IN THE ENDOSCOPY ROOM.   YOU MAY WEAR DEODORANT, PLEASE REMOVE ALL JEWELRY, WATCHES RINGS, BODY PIERCINGS AND LEAVE AT HOME.   WOMEN: NO MAKE-UP, LOTIONS PERFUMES QUESTIONS PLEASE CALL [facility]  LONG ENDOSCOPY [phone].

## 2013-03-21 NOTE — Progress Notes (Addendum)
SPOKE WITH KATRINA DR [facility] AND MADE AWARE PT/SPOUSE REFUSED TO TAKE INSTRUCTIONS BY PHONE, REQUESTED INSTRUCTIONS TO BE MAILED TO HOME, INSTRUCTIONS TYPED(SEE PROGRESS NOTES) AND MAILED TO PATIENT HOME ON 03/21/2013

## 2013-04-09 ENCOUNTER — Ambulatory Visit (HOSPITAL_COMMUNITY): Payer: Medicare Other | Admitting: Anesthesiology

## 2013-04-09 ENCOUNTER — Ambulatory Visit (HOSPITAL_COMMUNITY)
Admission: RE | Admit: 2013-04-09 | Discharge: 2013-04-09 | Disposition: A | Discharge status: Home / Self Care | Payer: Medicare Other | Source: Ambulatory Visit | Attending: Gastroenterology | Admitting: Gastroenterology

## 2013-04-09 ENCOUNTER — Encounter (HOSPITAL_COMMUNITY): Payer: Self-pay

## 2013-04-09 ENCOUNTER — Encounter (HOSPITAL_COMMUNITY)
Admission: RE | Disposition: A | Discharge status: Home / Self Care | Payer: Self-pay | Source: Ambulatory Visit | Attending: Gastroenterology

## 2013-04-09 ENCOUNTER — Encounter (HOSPITAL_COMMUNITY): Payer: Self-pay | Admitting: Anesthesiology

## 2013-04-09 DIAGNOSIS — I2510 Atherosclerotic heart disease of native coronary artery without angina pectoris: Secondary | ICD-10-CM | POA: Insufficient documentation

## 2013-04-09 DIAGNOSIS — I422 Other hypertrophic cardiomyopathy: Secondary | ICD-10-CM | POA: Insufficient documentation

## 2013-04-09 DIAGNOSIS — I252 Old myocardial infarction: Secondary | ICD-10-CM | POA: Insufficient documentation

## 2013-04-09 DIAGNOSIS — Z8673 Personal history of transient ischemic attack (TIA), and cerebral infarction without residual deficits: Secondary | ICD-10-CM | POA: Insufficient documentation

## 2013-04-09 DIAGNOSIS — K921 Melena: Secondary | ICD-10-CM | POA: Insufficient documentation

## 2013-04-09 DIAGNOSIS — Z951 Presence of aortocoronary bypass graft: Secondary | ICD-10-CM | POA: Insufficient documentation

## 2013-04-09 DIAGNOSIS — Z800 Family history of malignant neoplasm of digestive organs: Secondary | ICD-10-CM | POA: Insufficient documentation

## 2013-04-09 DIAGNOSIS — M810 Age-related osteoporosis without current pathological fracture: Secondary | ICD-10-CM | POA: Insufficient documentation

## 2013-04-09 DIAGNOSIS — E7800 Pure hypercholesterolemia, unspecified: Secondary | ICD-10-CM | POA: Insufficient documentation

## 2013-04-09 DIAGNOSIS — D693 Immune thrombocytopenic purpura: Secondary | ICD-10-CM | POA: Insufficient documentation

## 2013-04-09 DIAGNOSIS — K648 Other hemorrhoids: Secondary | ICD-10-CM | POA: Insufficient documentation

## 2013-04-09 HISTORY — PX: COLONOSCOPY WITH PROPOFOL: SHX5780

## 2013-04-09 SURGERY — COLONOSCOPY WITH PROPOFOL
Anesthesia: Monitor Anesthesia Care

## 2013-04-09 MED ORDER — PROPOFOL INFUSION 10 MG/ML OPTIME
INTRAVENOUS | Status: DC | PRN
  Administered 2013-04-09: 100 ug/kg/min via INTRAVENOUS

## 2013-04-09 MED ORDER — MIDAZOLAM HCL 5 MG/5ML IJ SOLN
INTRAMUSCULAR | Status: DC | PRN
  Administered 2013-04-09: 2 mg via INTRAVENOUS

## 2013-04-09 MED ORDER — GLYCOPYRROLATE 0.2 MG/ML IJ SOLN
INTRAMUSCULAR | Status: DC | PRN
  Administered 2013-04-09: 0.1 mg via INTRAVENOUS

## 2013-04-09 MED ORDER — SODIUM CHLORIDE 0.9 % IV SOLN: INTRAVENOUS | Status: DC

## 2013-04-09 MED ORDER — ONDANSETRON HCL 4 MG/2ML IJ SOLN
INTRAMUSCULAR | Status: DC | PRN
  Administered 2013-04-09: 4 mg via INTRAVENOUS

## 2013-04-09 MED ORDER — FENTANYL CITRATE 0.05 MG/ML IJ SOLN
INTRAMUSCULAR | Status: DC | PRN
  Administered 2013-04-09 (×2): 25 ug via INTRAVENOUS
  Administered 2013-04-09: 50 ug via INTRAVENOUS

## 2013-04-09 MED ORDER — LIDOCAINE HCL 1 % IJ SOLN
INTRAMUSCULAR | Status: DC | PRN
  Administered 2013-04-09: 50 mg via INTRADERMAL

## 2013-04-09 MED ORDER — LACTATED RINGERS IV SOLN
INTRAVENOUS | Status: DC | PRN
  Administered 2013-04-09: 08:00:00 via INTRAVENOUS

## 2013-04-09 MED ORDER — EPHEDRINE SULFATE 50 MG/ML IJ SOLN
INTRAMUSCULAR | Status: DC | PRN
  Administered 2013-04-09: 7.5 mg via INTRAVENOUS

## 2013-04-09 SURGICAL SUPPLY — 22 items

## 2013-04-09 NOTE — Transfer of Care (Signed)
Immediate Anesthesia Transfer of Care Note  Patient: [name]  Procedure(s) Performed: Procedure(s): COLONOSCOPY WITH PROPOFOL (N/A)  Patient Location: PACU and Endoscopy Unit  Anesthesia Type:MAC  Level of Consciousness: awake, alert , oriented and patient cooperative  Airway & Oxygen Therapy: Patient Spontanous Breathing and Patient connected to face mask oxygen  Post-op Assessment: Report given to PACU RN, Post -op Vital signs reviewed and stable and Patient moving all extremities  Post vital signs: Reviewed and stable  Complications: No apparent anesthesia complications

## 2013-04-09 NOTE — Op Note (Signed)
Procedure: Diagnostic colonoscopy to evaluate hematochezia.  Endoscopist: Danise Edge  Premedication: Propofol administered by anesthesia  Procedure: The patient was placed in the left lateral decubitus position. Anal inspection and digital rectal exam were normal. The Pentax pediatric colonoscope was introduced into the rectum and advanced to the cecum. A normal-appearing ileocecal valve and appendiceal orifice were identified. Colonic preparation for the exam today was good.  Rectum. Normal. Retroflexed view of the distal rectum normal.  Sigmoid colon. Normal.  Descending colon. A 3 mm sessile polyp was removed with the cold biopsy forceps. A 5 mm sessile polyp was removed with the electrocautery snare. A 1 cm sessile polyp was removed in piecemeal fashion with the electrocautery snare. All polyps were submitted in one bottle for pathological evaluation.  Splenic flexure. Normal.  Transverse colon. Normal.  Hepatic flexure. Normal.  Ascending colon. Normal.  Cecum and ileocecal valve. Normal.  Assessment: From the descending colon, a 3 mm sessile polyp was removed with the cold biopsy forceps, a 5 mm sessile polyp was removed with the electrocautery snare, a 1 cm sessile polyp was removed with the electrocautery snare.  Recommendation: I will review the polyp pathology to determine when she requires a surveillance colonoscopy.

## 2013-04-09 NOTE — Anesthesia Preprocedure Evaluation (Addendum)
Anesthesia Evaluation  Patient identified by MRN, date of birth, ID band Patient awake    Reviewed: Allergy & Precautions, H&P , NPO status , Patient's Chart, lab work & pertinent test results  Airway Mallampati: II TM Distance: >3 FB Neck ROM: Full    Dental  (+) Dental Advisory Given and Teeth Intact   Pulmonary neg pulmonary ROS,  breath sounds clear to auscultation        Cardiovascular + CAD and + CABG + Valvular Problems/Murmurs Rhythm:Regular Rate:Normal     Neuro/Psych CVA negative psych ROS   GI/Hepatic negative GI ROS, Neg liver ROS,   Endo/Other  negative endocrine ROS  Renal/GU negative Renal ROS     Musculoskeletal negative musculoskeletal ROS (+)   Abdominal   Peds  Hematology negative hematology ROS (+)   Anesthesia Other Findings   Reproductive/Obstetrics negative OB ROS                          Anesthesia Physical Anesthesia Plan  ASA: III  Anesthesia Plan: MAC   Post-op Pain Management:    Induction: Intravenous  Airway Management Planned: Natural Airway and Simple Face Mask  Additional Equipment:   Intra-op Plan:   Post-operative Plan:   Informed Consent: I have reviewed the patients History and Physical, chart, labs and discussed the procedure including the risks, benefits and alternatives for the proposed anesthesia with the patient or authorized representative who has indicated his/her understanding and acceptance.   Dental advisory given  Plan Discussed with: CRNA  Anesthesia Plan Comments:         Anesthesia Quick Evaluation

## 2013-04-09 NOTE — H&P (Signed)
  Problem: Painless hematochezia.  History: The patient is a 79 year old female born 1932-12-08. Her mother and sister was diagnosed with colon cancer. The patient underwent a normal screening colonoscopies in 1999, 2002, and 2008. She has been diagnosed with autoimmune thrombocytopenia. She does not take blood thinners or antiplatelet medication. Approximately one month ago, the patient had an episode of large-volume hematochezia which has not recurred. She does have prolapsed internal hemorrhoids.  The patient is scheduled to undergo a diagnostic colonoscopy.  Past medical history: Hypercholesterolemia. Coronary artery disease. Stroke in 2010. Osteoporosis. Hypertrophic cardiomyopathy. Myocardial infarction. Coronary artery stent placement. Paroxysmal atrial fibrillation. Bilateral hearing loss. Hiatal hernia. Kidney stones. Osteoporosis. Pancreatitis. Tubal ligation. Appendectomy. Septal myomectomy. Coronary artery bypass grafting  Allergies: Iodine. Codeine. Demerol. Celebrex. Lipitor. Zocor. Fosamax.  Habits: The patient has never smoked cigarettes. She does not consume alcohol.  Exam: The patient is alert and lying comfortably on the endoscopy stretcher. Abdomen is soft and nontender to palpation. Cardiac exam reveals a regular rhythm. Lungs are clear to auscultation.  Plan: Proceed with diagnostic colonoscopy following an episode of painless hematochezia.

## 2013-04-09 NOTE — Anesthesia Postprocedure Evaluation (Signed)
Anesthesia Post Note  Patient: [name]  Procedure(s) Performed: Procedure(s) (LRB): COLONOSCOPY WITH PROPOFOL (N/A)  Anesthesia type: MAC  Patient location: PACU  Post pain: Pain level controlled  Post assessment: Post-op Vital signs reviewed  Last Vitals: BP 138/59  Pulse 54  Temp(Src) 36.9 C (Oral)  Resp 18  Ht 5' (1.524 m)  Wt 140 lb (63.504 kg)  BMI 27.34 kg/m2  SpO2 98%  Post vital signs: Reviewed  Level of consciousness: awake  Complications: No apparent anesthesia complications

## 2013-04-10 ENCOUNTER — Encounter (HOSPITAL_COMMUNITY): Payer: Self-pay | Admitting: Gastroenterology

## 2013-05-06 ENCOUNTER — Other Ambulatory Visit (HOSPITAL_BASED_OUTPATIENT_CLINIC_OR_DEPARTMENT_OTHER): Payer: Medicare Other

## 2013-05-06 DIAGNOSIS — D696 Thrombocytopenia, unspecified: Secondary | ICD-10-CM

## 2013-05-06 LAB — CBC WITH DIFFERENTIAL/PLATELET
Basophils Absolute: 0.1 10*3/uL (ref 0.0–0.1)
Eosinophils Absolute: 0.1 10*3/uL (ref 0.0–0.5)
HCT: 40.6 % (ref 34.8–46.6)
HGB: 13.6 g/dL (ref 11.6–15.9)
LYMPH%: 43.8 % (ref 14.0–49.7)
MONO#: 0.9 10*3/uL (ref 0.1–0.9)
NEUT#: 3.1 10*3/uL (ref 1.5–6.5)
Platelets: 74 10*3/uL — ABNORMAL LOW (ref 145–400)
RBC: 4.72 10*6/uL (ref 3.70–5.45)
WBC: 7.3 10*3/uL (ref 3.9–10.3)

## 2013-05-16 ENCOUNTER — Telehealth: Payer: Self-pay | Admitting: Medical Oncology

## 2013-05-16 NOTE — Telephone Encounter (Signed)
I called pt to give her results of her CBC. Her platelet count is 79 which is stable. We will continue to keep her under observation. She voiced understanding.

## 2013-06-21 ENCOUNTER — Telehealth: Payer: Self-pay | Admitting: Interventional Cardiology

## 2013-06-21 MED ORDER — OMEGA-3-ACID ETHYL ESTERS 1 G PO CAPS: 1.0000 g | ORAL_CAPSULE | Freq: Two times a day (BID) | ORAL | Status: DC

## 2013-06-21 MED ORDER — SOTALOL HCL 160 MG PO TABS: 160.0000 mg | ORAL_TABLET | Freq: Two times a day (BID) | ORAL | Status: DC

## 2013-06-21 MED ORDER — EZETIMIBE 10 MG PO TABS: 10.0000 mg | ORAL_TABLET | Freq: Every day | ORAL | Status: DC

## 2013-06-21 NOTE — Telephone Encounter (Signed)
New Problem:  Pt states she has 3 meds to be refilled...  Zedia  70 mg  Lavaesa 7gm  Sotalol 160mg   [facility] [address] [place]

## 2013-06-21 NOTE — Telephone Encounter (Signed)
Pt meds refilled zetia, lovaza, sotalol

## 2013-07-04 ENCOUNTER — Other Ambulatory Visit (HOSPITAL_BASED_OUTPATIENT_CLINIC_OR_DEPARTMENT_OTHER): Payer: Medicare Other | Admitting: Lab

## 2013-07-04 DIAGNOSIS — D696 Thrombocytopenia, unspecified: Secondary | ICD-10-CM

## 2013-07-04 LAB — CBC WITH DIFFERENTIAL/PLATELET
BASO%: 0.5 % (ref 0.0–2.0)
Eosinophils Absolute: 0.1 10*3/uL (ref 0.0–0.5)
HCT: 38.7 % (ref 34.8–46.6)
LYMPH%: 41.3 % (ref 14.0–49.7)
MCHC: 33.4 g/dL (ref 31.5–36.0)
MCV: 85.8 fL (ref 79.5–101.0)
MONO#: 0.6 10*3/uL (ref 0.1–0.9)
NEUT%: 46.4 % (ref 38.4–76.8)
Platelets: 75 10*3/uL — ABNORMAL LOW (ref 145–400)
WBC: 6.1 10*3/uL (ref 3.9–10.3)

## 2013-07-12 ENCOUNTER — Telehealth: Payer: Self-pay | Admitting: *Deleted

## 2013-07-12 NOTE — Telephone Encounter (Signed)
Message copied by Wende Mott on Fri Jul 12, 2013  3:21 PM ------      Message from: Myrtis Ser      Created: Fri Jul 12, 2013 12:58 PM       Please call pt. Let her know that her Plt count is stable. Continue observation. ------

## 2013-07-12 NOTE — Telephone Encounter (Signed)
Informed pt of Platelet count stable,  Keep next appt in December as scheduled.  She verbalized understanding.

## 2013-07-19 ENCOUNTER — Telehealth: Payer: Self-pay

## 2013-07-19 MED ORDER — OMEGA-3-ACID ETHYL ESTERS 1 G PO CAPS: 2.0000 g | ORAL_CAPSULE | Freq: Two times a day (BID) | ORAL | Status: DC

## 2013-07-19 NOTE — Telephone Encounter (Signed)
pt called to sts that she has been taking lovaza 2 tabs bid.Dr.Smith previous o/v with [facility] 10/2012 listed 1tablet bid. rx previously refilled for the doasge. per Dr.Smith ok to refill 2 tablet bid.pt verbalized understanding.

## 2013-08-05 ENCOUNTER — Telehealth: Payer: Self-pay | Admitting: Interventional Cardiology

## 2013-08-05 NOTE — Telephone Encounter (Signed)
Can stop lovasa and start over the cunter Omega 3 fish oil 3 grams daily

## 2013-08-05 NOTE — Telephone Encounter (Signed)
New message    On lavaza  Ins will not pay for it next year.   Is there any other medication she can take?

## 2013-08-06 NOTE — Telephone Encounter (Signed)
pt instructed per Dr.Smith pt can stop lovaza and start over the cunter Omega 3 fish oil 3 grams daily.pt verbalized understanding.

## 2013-09-02 ENCOUNTER — Telehealth: Payer: Self-pay | Admitting: Hematology and Oncology

## 2013-09-02 ENCOUNTER — Encounter: Payer: Self-pay | Admitting: Hematology and Oncology

## 2013-09-02 ENCOUNTER — Ambulatory Visit (HOSPITAL_BASED_OUTPATIENT_CLINIC_OR_DEPARTMENT_OTHER): Payer: Medicare Other | Admitting: Hematology and Oncology

## 2013-09-02 ENCOUNTER — Other Ambulatory Visit (HOSPITAL_BASED_OUTPATIENT_CLINIC_OR_DEPARTMENT_OTHER): Payer: Medicare Other

## 2013-09-02 VITALS — BP 143/61 | HR 56 | Temp 97.8°F | Resp 18 | Ht 60.0 in | Wt 141.4 lb

## 2013-09-02 DIAGNOSIS — K649 Unspecified hemorrhoids: Secondary | ICD-10-CM

## 2013-09-02 DIAGNOSIS — D696 Thrombocytopenia, unspecified: Secondary | ICD-10-CM

## 2013-09-02 LAB — CBC WITH DIFFERENTIAL/PLATELET
BASO%: 0.5 % (ref 0.0–2.0)
EOS%: 1.6 % (ref 0.0–7.0)
HCT: 39.1 % (ref 34.8–46.6)
LYMPH%: 41.9 % (ref 14.0–49.7)
MCH: 29.4 pg (ref 25.1–34.0)
MCHC: 33.7 g/dL (ref 31.5–36.0)
MONO%: 9.7 % (ref 0.0–14.0)
NEUT%: 46.3 % (ref 38.4–76.8)
Platelets: 84 10*3/uL — ABNORMAL LOW (ref 145–400)

## 2013-09-02 NOTE — Progress Notes (Signed)
[facility] OFFICE PROGRESS NOTE  Allean Found, MD DIAGNOSIS:  Chronic thrombocytopenia, probable ITP  SUMMARY OF HEMATOLOGIC HISTORY: This is a pleasant 79 year old lady with chronic thrombocytopenia. The patient was discovered to have low platelet count when she was placed on Plavix for coronary artery disease. Plavix was discontinued due to thrombocytopenia In November 2013, she had a bone marrow aspirate and biopsy which showed abundant megakaryocytes, giving a probable diagnosis of ITP. She is being observed. INTERVAL HISTORY: [name] 79 y.o. female returns for further followup. The patient denies any recent signs or symptoms of bleeding such as spontaneous epistaxis, hematuria or hematochezia.  I have reviewed the past medical history, past surgical history, social history and family history with the patient and they are unchanged from previous note.  ALLERGIES:  is allergic to bee venom; ambien; aspirin; celebrex; codeine; darvocet; demerol; fosamax; lipitor; morphine and related; valium; and iodine.  MEDICATIONS:  Current Outpatient Prescriptions  Medication Sig Dispense Refill  . calcium citrate (CALCITRATE - DOSED IN MG ELEMENTAL CALCIUM) 950 MG tablet Take 1 tablet by mouth daily.      . Cholecalciferol (VITAMIN D-3 PO) Take 1 tablet by mouth daily.      Marland Kitchen ezetimibe (ZETIA) 10 MG tablet Take 1 tablet (10 mg total) by mouth daily.  30 tablet  6  . fexofenadine (ALLEGRA) 180 MG tablet Take 90 mg by mouth daily.       . meclizine (ANTIVERT) 25 MG tablet Take 25 mg by mouth daily as needed for dizziness.       . Multiple Vitamins-Minerals (CENTRUM CARDIO PO) Take 1 tablet by mouth daily.      . Omega 3 1000 MG CAPS Take 1 each by mouth 2 (two) times daily.      Marland Kitchen omeprazole (PRILOSEC) 40 MG capsule Take 40 mg by mouth daily.      . sotalol (BETAPACE) 160 MG tablet Take 1 tablet (160 mg total) by mouth 2 (two) times daily.  60 tablet  6   No  current facility-administered medications for this visit.     REVIEW OF SYSTEMS:   Constitutional: Denies fevers, chills or night sweats Eyes: Denies blurriness of vision Ears, nose, mouth, throat, and face: Denies mucositis or sore throat Respiratory: Denies cough, dyspnea or wheezes Cardiovascular: Denies palpitation, chest discomfort or lower extremity swelling Gastrointestinal:  Denies nausea, heartburn or change in bowel habits Skin: Denies abnormal skin rashes Lymphatics: Denies new lymphadenopathy or easy bruising Neurological:Denies numbness, tingling or new weaknesses Behavioral/Psych: Mood is stable, no new changes  All other systems were reviewed with the patient and are negative.  PHYSICAL EXAMINATION: ECOG PERFORMANCE STATUS: 0 - Asymptomatic  Filed Vitals:   09/02/13 0928  BP: 143/61  Pulse: 56  Temp: 97.8 F (36.6 C)  Resp: 18   Filed Weights   09/02/13 0928  Weight: 141 lb 6.4 oz (64.139 kg)    GENERAL:alert, no distress and comfortable SKIN: skin color, texture, turgor are normal, no rashes or significant lesions EYES: normal, Conjunctiva are pink and non-injected, sclera clear OROPHARYNX:no exudate, no erythema and lips, buccal mucosa, and tongue normal  NECK: supple, thyroid normal size, non-tender, without nodularity LYMPH:  no palpable lymphadenopathy in the cervical, axillary or inguinal LUNGS: clear to auscultation and percussion with normal breathing effort HEART: regular rate & rhythm and no murmurs and no lower extremity edema ABDOMEN:abdomen soft, non-tender and normal bowel sounds Musculoskeletal:no cyanosis of digits and no clubbing  NEURO: alert &  oriented x 3 with fluent speech, no focal motor/sensory deficits  LABORATORY DATA:  I have reviewed the data as listed Results for orders placed in visit on 09/02/13 (from the past 48 hour(s))  CBC WITH DIFFERENTIAL     Status: Abnormal   Collection Time    09/02/13  8:48 AM      Result Value  Range   WBC 5.5  3.9 - 10.3 10e3/uL   NEUT# 2.6  1.5 - 6.5 10e3/uL   HGB 13.2  11.6 - 15.9 g/dL   HCT 13.2  44.0 - 10.2 %   Platelets 84 (*) 145 - 400 10e3/uL   MCV 87.1  79.5 - 101.0 fL   MCH 29.4  25.1 - 34.0 pg   MCHC 33.7  31.5 - 36.0 g/dL   RBC 7.25  3.66 - 4.40 10e6/uL   RDW 14.0  11.2 - 14.5 %   lymph# 2.3  0.9 - 3.3 10e3/uL   MONO# 0.5  0.1 - 0.9 10e3/uL   Eosinophils Absolute 0.1  0.0 - 0.5 10e3/uL   Basophils Absolute 0.0  0.0 - 0.1 10e3/uL   NEUT% 46.3  38.4 - 76.8 %   LYMPH% 41.9  14.0 - 49.7 %   MONO% 9.7  0.0 - 14.0 %   EOS% 1.6  0.0 - 7.0 %   BASO% 0.5  0.0 - 2.0 %    Lab Results  Component Value Date   WBC 5.5 09/02/2013   HGB 13.2 09/02/2013   HCT 39.1 09/02/2013   MCV 87.1 09/02/2013   PLT 84* 09/02/2013   ASSESSMENT & PLAN:  #1 chronic thrombocytopenia, probable ITP I discussed with the patient the natural history of ITP. As long as her platelet count stay above 50,000, she does not require further intervention or treatment. The patient followup with her primary care provider on a regular basis. Next appointment is in April. I like to see the patient on a yearly basis to watch her platelet count. I warned her about possibility of getting a lower platelet count at times of infection or stress. If she noticed significant bleeding in the future, she will call me and I will see her back sooner. #2 chronic hemorrhoidal bleeding The patient has intermittent flare of hemorrhoidal bleeding. I recommended she increase oral fluid intake and to consider taking stool softener. As long as her hemorrhoidal bleeding is self limiting, she does not require further intervention. Her most recent colonoscopy this year was negative. All questions were answered. The patient knows to call the clinic with any problems, questions or concerns. No barriers to learning was detected.  I spent 15 minutes counseling the patient face to face. The total time spent in the appointment was  20 minutes and more than 50% was on counseling.     Leland Staszewski, MD 09/02/2013 9:58 AM

## 2013-09-02 NOTE — Telephone Encounter (Signed)
worked 09/02/13 POF AVS and Dec 2015 cal mailed to pt shh

## 2013-09-04 ENCOUNTER — Ambulatory Visit: Payer: Medicare Other | Admitting: Diagnostic Neuroimaging

## 2013-09-06 ENCOUNTER — Telehealth: Payer: Self-pay | Admitting: Neurology

## 2013-09-06 NOTE — Telephone Encounter (Signed)
Patient was here today for an appointment, but she was actually scheduled for 09-04-13.   I explained this to patient and her husband, but they insisted they received a call Friday that the appointment was for today.  The patient was very annoyed and said she has been trying to get an appointment sooner, and trying to get her MRI results but was unable to get a response.  Her MRI was done in May, had been called in June but no results were given.  I told her I could double book the doctor at 9am, but they refused to wait any longer and said they were not coming back.

## 2013-09-06 NOTE — Telephone Encounter (Signed)
I called patient. Gave MRI results. Chronic findings, but nothing acute. Offered follow up appt but patient declined. -VRP

## 2014-01-14 ENCOUNTER — Encounter: Payer: Self-pay | Admitting: Interventional Cardiology

## 2014-02-05 ENCOUNTER — Encounter: Payer: Self-pay | Admitting: Interventional Cardiology

## 2014-02-18 ENCOUNTER — Ambulatory Visit: Payer: Medicare Other | Admitting: Interventional Cardiology

## 2014-03-11 ENCOUNTER — Ambulatory Visit: Payer: Medicare Other | Admitting: Interventional Cardiology

## 2014-03-24 ENCOUNTER — Ambulatory Visit: Payer: Medicare Other | Admitting: Interventional Cardiology

## 2014-04-08 ENCOUNTER — Ambulatory Visit (INDEPENDENT_AMBULATORY_CARE_PROVIDER_SITE_OTHER): Payer: Commercial Managed Care - HMO | Admitting: Interventional Cardiology

## 2014-04-08 ENCOUNTER — Telehealth: Payer: Self-pay | Admitting: Interventional Cardiology

## 2014-04-08 ENCOUNTER — Encounter: Payer: Self-pay | Admitting: Interventional Cardiology

## 2014-04-08 VITALS — BP 140/72 | HR 54 | Ht 61.0 in | Wt 128.0 lb

## 2014-04-08 DIAGNOSIS — I639 Cerebral infarction, unspecified: Secondary | ICD-10-CM

## 2014-04-08 DIAGNOSIS — I2510 Atherosclerotic heart disease of native coronary artery without angina pectoris: Secondary | ICD-10-CM | POA: Insufficient documentation

## 2014-04-08 DIAGNOSIS — I421 Obstructive hypertrophic cardiomyopathy: Secondary | ICD-10-CM | POA: Insufficient documentation

## 2014-04-08 DIAGNOSIS — I4891 Unspecified atrial fibrillation: Secondary | ICD-10-CM

## 2014-04-08 DIAGNOSIS — I480 Paroxysmal atrial fibrillation: Secondary | ICD-10-CM | POA: Insufficient documentation

## 2014-04-08 DIAGNOSIS — I629 Nontraumatic intracranial hemorrhage, unspecified: Secondary | ICD-10-CM | POA: Insufficient documentation

## 2014-04-08 DIAGNOSIS — I6350 Cerebral infarction due to unspecified occlusion or stenosis of unspecified cerebral artery: Secondary | ICD-10-CM

## 2014-04-08 DIAGNOSIS — I25810 Atherosclerosis of coronary artery bypass graft(s) without angina pectoris: Secondary | ICD-10-CM

## 2014-04-08 DIAGNOSIS — R55 Syncope and collapse: Secondary | ICD-10-CM

## 2014-04-08 NOTE — Progress Notes (Signed)
Patient ID: [name], female   DOB: Nov 09, 1932, 80 y.o.   MRN: [id number] Past Medical History  Hyperlipidemia   ASCVD, two-vessel, see below   CVA (2010)   Osteoporosis   IV contrast allergy   HOCM, with severe MR, S./P. septal myectomy with MV ring, 10/10, with SV SVG   Multiple medication sensitivities including multiple statins   significant cough on ACE inhibitors and ARBS   s/p anterior wall MI, 02/07, s/p PCI/DES stent x2 mid LAD, 02/07   recurrent angina s/p PCI/DES stent for restenosis, proximal LAD 11/2006   persistant angina, s/p DES prox LAD, EF 60-65%, cath 10/01/2008   paroxysmal Afib, post op, none since 11/10   mult small hemmorrhagic CVA on heparin --cerebral amyloid angiosis by MRI   Short term memory loss   Last pap 2006   Td 2009   Bilateral hearing loss, sensorineural   Hiatal hernia   Kidney stones   Osteoporosis   Pancreatitis      [address] [place], [place]  [address] Phone: [phone] Fax:  [phone]  Date:  04/08/2014   ID:  [name], DOB [birth year], MRN [id number]  PCP:  [name], MD   ASSESSMENT:  1. Bradycardia and near syncope. Rule out excessive effect of sotalol. The recent episodes sound like vasovagal  2. Hypertrophic obstructive cardiomyopathy 3.Coronary atherosclerotic heart disease, stable without angina 4. History of CVA  PLAN:  1. 7 day event monitor/continuous ambulatory monitor 2. Clinical observation 3. Call the patient at the response to pallor, diaphoresis, weakness is to sit or lie down place a chloride over the 4 head and wait for the symptoms to resolve.   SUBJECTIVE: [name] is a 80 y.o. female who is had 2 episodes over the past week of sudden nausea, leg weakness, diaphoresis, and lightheadedness. No associated palpitations or true syncope. She has no prior history of syncope. There is a history and she is on sotalol. She called because she  was worried she was having a heart attack. She has had no chest discomfort. She denies orthopnea, PND, leg edema.   Wt Readings from Last 3 Encounters:  04/08/14 128 lb (58.06 kg)  09/02/13 141 lb 6.4 oz (64.139 kg)  04/09/13 140 lb (63.504 kg)     Past Medical History  Diagnosis Date  . Thrombocytopenia   . Fatigue   . CAD (coronary artery disease) 2003    on Plavix.   Marland Kitchen Hemorrhagic stroke 2010  . Hypercholesteremia   . Pancreatitis 1970  . Unspecified deficiency anemia   . Osteoporosis   . Hiatal hernia   . Kidney stones   . Bilateral hearing loss   . Pancreatitis     Current Outpatient Prescriptions  Medication Sig Dispense Refill  . calcium citrate (CALCITRATE - DOSED IN MG ELEMENTAL CALCIUM) 950 MG tablet Take 1 tablet by mouth daily.      . Cholecalciferol (VITAMIN D-3 PO) Take 1 tablet by mouth daily.      Marland Kitchen EPINEPHrine (ADRENALIN) 1 MG/ML injection 1 mg once.      . ezetimibe (ZETIA) 10 MG tablet Take 1 tablet (10 mg total) by mouth daily.  30 tablet  6  . fexofenadine (ALLEGRA) 180 MG tablet Take 90 mg by mouth daily.       Marland Kitchen Lysine 500 MG CAPS Take by mouth.      . meclizine (ANTIVERT) 25 MG tablet Take 25 mg by mouth daily as needed  for dizziness.       . Multiple Vitamins-Minerals (CENTRUM CARDIO PO) Take 1 tablet by mouth daily.      . nitroGLYCERIN (NITROSTAT) 0.4 MG SL tablet Place 0.4 mg under the tongue every 5 (five) minutes as needed for chest pain.      . Omega 3 1000 MG CAPS Take 1 each by mouth 2 (two) times daily.      Marland Kitchen omeprazole (PRILOSEC) 40 MG capsule Take 40 mg by mouth daily.      . sotalol (BETAPACE) 160 MG tablet Take 1 tablet (160 mg total) by mouth 2 (two) times daily.  60 tablet  6  . pregabalin (LYRICA) 50 MG capsule Take 50 mg by mouth 3 (three) times daily.      . traMADol (ULTRAM) 50 MG tablet Take by mouth every 6 (six) hours as needed.      . valACYclovir (VALTREX) 1000 MG tablet Take 1,000 mg by mouth 2 (two) times daily.       No  current facility-administered medications for this visit.    Allergies:    Allergies  Allergen Reactions  . Bee Venom Anaphylaxis  . Lyrica [Pregabalin]   . Ambien [Zolpidem Tartrate] Other (See Comments)    Altered Mental Status  . Aspirin Other (See Comments)    Bleeding, aneurysm- when she was on plavix  . Celebrex [Celecoxib] Other (See Comments)    Legs hurt  . Codeine   . Darvocet [Propoxyphene N-Acetaminophen] Other (See Comments)    Hallucinates  . Demerol [Meperidine] Hives    Welts  . Fosamax [Alendronate Sodium] Other (See Comments)    Legs hurt  . Lipitor [Atorvastatin] Other (See Comments)    Legs hurt  . Morphine And Related Other (See Comments)    Hallucinations  . Valium [Diazepam] Other (See Comments)    Altered Mental Status  . Iodine Hives    Welts    Social History:  The patient  reports that she has never smoked. She has never used smokeless tobacco. She reports that she does not drink alcohol or use illicit drugs.   ROS:  Please see the history of present illness.    No transient neurological symptoms. No blood in the urine or stool. No lower extremity swelling.   All other systems reviewed and negative.   OBJECTIVE: VS:  BP 140/72  Pulse 54  Ht 5\' 1"  (1.549 m)  Wt 128 lb (58.06 kg)  BMI 24.20 kg/m2 Well nourished, well developed, in no acute distress, was very H. is HEENT: normal Neck: JVD flat. Carotid bruit absent  Cardiac:  normal S1, S2; RRR; no murmur Lungs:  clear to auscultation bilaterally, no wheezing, rhonchi or rales Abd: soft, nontender, no hepatomegaly Ext: Edema absent. Pulses 2+ and symmetric Skin: warm and dry Neuro:  CNs 2-12 intact, no focal abnormalities noted  EKG:  Sinus bradycardia 54 beats per minute with prominent voltage and left axis       Signed, Illene Labrador III, MD 04/08/2014 10:09 AM

## 2014-04-08 NOTE — Telephone Encounter (Signed)
Spoke with pt and let her know I will speak with Dr. Tamala Julian and call her back.

## 2014-04-08 NOTE — Telephone Encounter (Signed)
Appt made for pt to see Dr. Tamala Julian, she will head over to our office now.

## 2014-04-08 NOTE — Patient Instructions (Signed)
Your physician has recommended that you wear an event monitor (wear at least 7 days). Event monitors are medical devices that record the heart's electrical activity. Doctors most often [place] these monitors to diagnose arrhythmias. Arrhythmias are problems with the speed or rhythm of the heartbeat. The monitor is a small, portable device. You can wear one while you do your normal daily activities. This is usually used to diagnose what is causing palpitations/syncope (passing out).  Your physician wants you to follow-up in: 1 year with Dr. Tamala Julian. You will receive a reminder letter in the mail two months in advance. If you don't receive a letter, please call our office to schedule the follow-up appointment.

## 2014-04-08 NOTE — Telephone Encounter (Signed)
New message     Pt states she has had really weak legs within the last two days---this was the only symptoms she had when she had her heart attack.  She want to be seen today

## 2014-04-09 ENCOUNTER — Encounter: Payer: Self-pay | Admitting: *Deleted

## 2014-04-09 ENCOUNTER — Encounter (INDEPENDENT_AMBULATORY_CARE_PROVIDER_SITE_OTHER): Payer: Commercial Managed Care - HMO

## 2014-04-09 DIAGNOSIS — R55 Syncope and collapse: Secondary | ICD-10-CM

## 2014-04-09 NOTE — Progress Notes (Signed)
Patient ID: [name], female   DOB: 06/28/1933, 80 y.o.   MRN: [id number] [employer] Cardiac Event monitor applied to patient for at least 7 days.  Patient enrolled for a 30 day cardiac event monitor.

## 2014-04-14 ENCOUNTER — Telehealth: Payer: Self-pay | Admitting: Interventional Cardiology

## 2014-04-14 NOTE — Telephone Encounter (Signed)
pt adv to have [employer] send out a new cardiac monitor and pt should wear it for atleast 7 days. pt agreeable and verbalized understanding.

## 2014-04-14 NOTE — Telephone Encounter (Signed)
New message     Pt has been wearing a monitor since last Tuesday.  She received a call from the company stating that they have not received any reports from the monitor.  It was broken.  They will send another monitor out.  Do you still want her to get another monitor?  She do not want to wear it again unless you say so.

## 2014-04-17 ENCOUNTER — Ambulatory Visit: Payer: Medicare Other | Admitting: Interventional Cardiology

## 2014-04-17 ENCOUNTER — Other Ambulatory Visit (HOSPITAL_COMMUNITY): Payer: Self-pay | Admitting: Family Medicine

## 2014-04-17 DIAGNOSIS — Z1231 Encounter for screening mammogram for malignant neoplasm of breast: Secondary | ICD-10-CM

## 2014-04-26 ENCOUNTER — Telehealth: Payer: Self-pay | Admitting: Adult Health

## 2014-04-26 NOTE — Telephone Encounter (Signed)
[name] is a 80 year old patient of Dr. Tamala Julian who with complaints of generalized fatigue and weakness. This symptoms are vague, and nonspecific, but she has become concerned. She states she is under a lot of stress. She wanted someone to talk to.    I reviewed her medications, she is on several 160 mg daily, but was not complaining of bradycardia or near syncope. She is not on antihypertensive medications on her list which could be precipitating her symptoms. I have asked her to rest today, and continue to monitor her symptoms. Should they persist she is to contact her primary care physician for further advisement.

## 2014-04-28 ENCOUNTER — Telehealth: Payer: Self-pay | Admitting: Interventional Cardiology

## 2014-04-28 NOTE — Telephone Encounter (Signed)
New message          Pt is experiencing leg pain and breathing problems that occur at the same time / pt is requesting to see dr Tamala Julian asap / pt will be awaiting your call

## 2014-04-28 NOTE — Telephone Encounter (Signed)
returned pt call.pt sts thats she has had leg weakness that started on Sat 8/8. pt has no other symptoms. pt denies chest pain, nausea,sob, swelling,fever chills. pt sts that with her previous MI she had no pain, only leg weakness.pt was seen on 7/21 by Dr.Smith with the same c/o.pt adv to f/u with her pcp.pt became very angry and sts" she will she [place] when she has an heart attack. Pt adv she should start with her pcp for a full eval of her leg weakness. If her pcp thinks she needs a cardiology f/u we would be happy to see her.pt became upset and says "if she drops dead, she hopes to see me in heaven" and hung up.

## 2014-05-08 ENCOUNTER — Ambulatory Visit (HOSPITAL_COMMUNITY)
Admission: RE | Admit: 2014-05-08 | Discharge: 2014-05-08 | Disposition: A | Discharge status: Home / Self Care | Payer: Medicare HMO | Source: Ambulatory Visit | Attending: Family Medicine | Admitting: Family Medicine

## 2014-05-08 DIAGNOSIS — Z1231 Encounter for screening mammogram for malignant neoplasm of breast: Secondary | ICD-10-CM | POA: Insufficient documentation

## 2014-05-21 ENCOUNTER — Telehealth: Payer: Self-pay

## 2014-05-21 NOTE — Telephone Encounter (Signed)
pt aware of cardiac monitor results -Normal pt verbalized understanding.

## 2014-06-26 ENCOUNTER — Telehealth: Payer: Self-pay

## 2014-06-26 NOTE — Telephone Encounter (Signed)
Cardiac clearance given to medical records to fax to [facility] fax [phone]

## 2014-06-27 ENCOUNTER — Telehealth: Payer: Self-pay | Admitting: Interventional Cardiology

## 2014-06-27 NOTE — Telephone Encounter (Signed)
Received request from Nurse fax box, documents faxed for surgical clearance. To: [facility]  Fax number: [phone] Attention: 10.9.15/km

## 2014-07-17 ENCOUNTER — Ambulatory Visit (INDEPENDENT_AMBULATORY_CARE_PROVIDER_SITE_OTHER): Payer: Commercial Managed Care - HMO | Admitting: Podiatry

## 2014-07-17 ENCOUNTER — Encounter: Payer: Self-pay | Admitting: Podiatry

## 2014-07-17 ENCOUNTER — Ambulatory Visit (INDEPENDENT_AMBULATORY_CARE_PROVIDER_SITE_OTHER): Payer: Commercial Managed Care - HMO

## 2014-07-17 VITALS — BP 149/72 | HR 56 | Resp 16

## 2014-07-17 DIAGNOSIS — M722 Plantar fascial fibromatosis: Secondary | ICD-10-CM

## 2014-07-17 DIAGNOSIS — M2042 Other hammer toe(s) (acquired), left foot: Secondary | ICD-10-CM

## 2014-07-17 DIAGNOSIS — D492 Neoplasm of unspecified behavior of bone, soft tissue, and skin: Secondary | ICD-10-CM

## 2014-07-17 MED ORDER — TRIAMCINOLONE ACETONIDE 10 MG/ML IJ SUSP
10.0000 mg | Freq: Once | INTRAMUSCULAR | Status: AC
  Administered 2014-07-17: 10 mg

## 2014-07-17 NOTE — Patient Instructions (Signed)

## 2014-07-17 NOTE — Progress Notes (Signed)
   Subjective:    Patient ID: [name], female    DOB: 15-Apr-1933, 80 y.o.   MRN: [id number]  HPI Comments: "I have this lump on my heel"  Patient c/o tender soft knot medial heel right since August. She was treated for bone spurs when it came up before but her PCP said it was not a spur. No treatment currently.  Foot Pain      Review of Systems  All other systems reviewed and are negative.      Objective:   Physical Exam        Assessment & Plan:

## 2014-07-17 NOTE — Progress Notes (Signed)
Subjective:     Patient ID: [name], female   DOB: 05/12/1933, 80 y.o.   MRN: [id number]  Foot Pain   patient presents stating I have this lump on my heel but what really bothers me is the bottom of my heel hurts when I walk   Review of Systems  All other systems reviewed and are negative.      Objective:   Physical Exam  Nursing note and vitals reviewed. Constitutional: She is oriented to person, place, and time.  Cardiovascular: Intact distal pulses.   Musculoskeletal: Normal range of motion.  Neurological: She is oriented to person, place, and time.  Skin: Skin is warm.   neurovascular status is found to be intact with muscle strength adequate and range of motion subtalar midtarsal joint within normal limits. Mild equinus condition was noted and I noted on the    posterior medial surface of the calcaneus there is a small fleshy nodule which measures approximately 5 x 5 mm and is nonpainful when pressed. I did note that the plantar heel is very tender when I pressed deep into the tissue  Assessment:     Probable plantar fasciitis right with small fleshy nodule which is noncontributory to the pain and is most likely a benign-type lesion     Plan:     H&P and x-ray reviewed. Today I injected the right plantar fascia 3 mg Kenalog 5 mg Xylocaine and advised if the small fleshy nodule should grow in size become painful or change call or we will consider biopsy. Patient had a fascial brace dispensed and will be reevaluated again in 1 week

## 2014-07-18 ENCOUNTER — Telehealth: Payer: Self-pay | Admitting: *Deleted

## 2014-07-18 NOTE — Telephone Encounter (Signed)
I was given a piece of paper to do exercises with.  No one showed me how to do it.  I don't know which foot goes back.  I don't want to do an exercise with the wrong foot.  My left foot is the one that bothers me.    I called and informed her that the hurt foot goes in the back when doing the exercise.  She stated, "Well which one am I supposed to be doing?"  I explained to her what to do.  "No one told me that's the only one I am supposed to be doing.  I been doing them all."  I told her it will not hurt.  She said, "Well thanks for calling."

## 2014-07-24 ENCOUNTER — Ambulatory Visit (INDEPENDENT_AMBULATORY_CARE_PROVIDER_SITE_OTHER): Payer: Commercial Managed Care - HMO | Admitting: Podiatry

## 2014-07-24 ENCOUNTER — Encounter: Payer: Self-pay | Admitting: Podiatry

## 2014-07-24 VITALS — BP 147/70 | HR 51 | Resp 14

## 2014-07-24 DIAGNOSIS — M722 Plantar fascial fibromatosis: Secondary | ICD-10-CM

## 2014-07-24 MED ORDER — TRIAMCINOLONE ACETONIDE 10 MG/ML IJ SUSP
10.0000 mg | Freq: Once | INTRAMUSCULAR | Status: AC
  Administered 2014-07-24: 10 mg

## 2014-07-24 NOTE — Progress Notes (Signed)
Subjective:     Patient ID: [name], female   DOB: [birth year], 80 y.o.   MRN: [id number]  HPIpatient states she had a terrible week with the strap but that her heel is feeling quite a bit better with one area that is still tender   Review of Systems     Objective:   Physical Exam Neurovascular status unchanged with significant diminishment of discomfort in the plantar aspect of the right heel at the insertion of the tendon into the calcaneus. Patient has overall good structure with no indications of other pathology and did not develop any breakdown secondary to the  brace    Assessment:     Plantar fasciitis right improving but still present    Plan:     Explained how to use the brace differently and reinjected the plantar fascia 3 mg Kenalog 5 g Xylocaine Marcaine mixture and instructed on physical therapy. Reappoint if symptoms persist

## 2014-09-02 ENCOUNTER — Other Ambulatory Visit (HOSPITAL_BASED_OUTPATIENT_CLINIC_OR_DEPARTMENT_OTHER): Payer: Commercial Managed Care - HMO

## 2014-09-02 ENCOUNTER — Other Ambulatory Visit: Payer: Self-pay | Admitting: Hematology and Oncology

## 2014-09-02 ENCOUNTER — Telehealth: Payer: Self-pay | Admitting: Hematology and Oncology

## 2014-09-02 ENCOUNTER — Ambulatory Visit (HOSPITAL_BASED_OUTPATIENT_CLINIC_OR_DEPARTMENT_OTHER): Payer: Commercial Managed Care - HMO | Admitting: Hematology and Oncology

## 2014-09-02 ENCOUNTER — Encounter: Payer: Self-pay | Admitting: Hematology and Oncology

## 2014-09-02 VITALS — BP 132/69 | HR 57 | Temp 97.8°F | Resp 20 | Ht 61.0 in | Wt 132.8 lb

## 2014-09-02 DIAGNOSIS — D696 Thrombocytopenia, unspecified: Secondary | ICD-10-CM

## 2014-09-02 LAB — CBC WITH DIFFERENTIAL/PLATELET
BASO%: 0.2 % (ref 0.0–2.0)
BASOS ABS: 0 10*3/uL (ref 0.0–0.1)
EOS%: 0.9 % (ref 0.0–7.0)
Eosinophils Absolute: 0.1 10*3/uL (ref 0.0–0.5)
HEMATOCRIT: 38.7 % (ref 34.8–46.6)
HEMOGLOBIN: 13.3 g/dL (ref 11.6–15.9)
LYMPH#: 3 10*3/uL (ref 0.9–3.3)
LYMPH%: 45.8 % (ref 14.0–49.7)
MCH: 29.8 pg (ref 25.1–34.0)
MCHC: 34.4 g/dL (ref 31.5–36.0)
MCV: 86.6 fL (ref 79.5–101.0)
MONO#: 0.5 10*3/uL (ref 0.1–0.9)
MONO%: 8.2 % (ref 0.0–14.0)
NEUT#: 3 10*3/uL (ref 1.5–6.5)
NEUT%: 44.9 % (ref 38.4–76.8)
PLATELETS: 76 10*3/uL — AB (ref 145–400)
RBC: 4.47 10*6/uL (ref 3.70–5.45)
RDW: 13.3 % (ref 11.2–14.5)
WBC: 6.6 10*3/uL (ref 3.9–10.3)
nRBC: 0 % (ref 0–0)

## 2014-09-02 LAB — COMPREHENSIVE METABOLIC PANEL (CC13)
ALBUMIN: 3.9 g/dL (ref 3.5–5.0)
ALK PHOS: 52 U/L (ref 40–150)
ALT: 21 U/L (ref 0–55)
AST: 21 U/L (ref 5–34)
Anion Gap: 8 mEq/L (ref 3–11)
BILIRUBIN TOTAL: 0.28 mg/dL (ref 0.20–1.20)
BUN: 18.7 mg/dL (ref 7.0–26.0)
CO2: 27 mEq/L (ref 22–29)
Calcium: 9.2 mg/dL (ref 8.4–10.4)
Chloride: 104 mEq/L (ref 98–109)
Creatinine: 0.9 mg/dL (ref 0.6–1.1)
EGFR: 60 mL/min/{1.73_m2} — AB (ref >90)
GLUCOSE: 105 mg/dL (ref 70–140)
POTASSIUM: 4.4 meq/L (ref 3.5–5.1)
SODIUM: 139 meq/L (ref 136–145)
TOTAL PROTEIN: 6.8 g/dL (ref 6.4–8.3)

## 2014-09-02 LAB — MORPHOLOGY: PLT EST: DECREASED

## 2014-09-02 NOTE — Progress Notes (Signed)
Industry NOTE  Adrienne Naas, MD SUMMARY OF HEMATOLOGIC HISTORY: This is a pleasant lady with chronic thrombocytopenia. The patient was discovered to have low platelet count when she was placed on Plavix for coronary artery disease. 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 She is being observed. INTERVAL HISTORY: [name] 80 y.o. female returns for further follow-up. She had recent diagnosis of shingles and flulike illness early this year, resolved. She has periodic hemorrhoidal bleeding. Her last bleeding episode was 3 weeks ago. It has improved since she modify her diet. Close monitoring of blood work by PCP show her blood count ranged from 51 to high 60s. She had recent cataract surgery without bleeding complications. I have reviewed the past medical history, past surgical history, social history and family history with the patient and they are unchanged from previous note.  ALLERGIES:  is allergic to bee venom; lyrica; ambien; aspirin; celebrex; codeine; darvocet; demerol; fosamax; lipitor; morphine and related; valium; and iodine.  MEDICATIONS:  Current Outpatient Prescriptions  Medication Sig Dispense Refill  . calcium citrate (CALCITRATE - DOSED IN MG ELEMENTAL CALCIUM) 950 MG tablet Take 1 tablet by mouth daily.    . Cholecalciferol (VITAMIN D-3 PO) Take 1 tablet by mouth daily.    Marland Kitchen EPINEPHrine (ADRENALIN) 1 MG/ML injection 1 mg once.    . ezetimibe (ZETIA) 10 MG tablet Take 1 tablet (10 mg total) by mouth daily. 30 tablet 6  . fexofenadine (ALLEGRA) 180 MG tablet Take 90 mg by mouth daily.     Marland Kitchen Lysine 500 MG CAPS Take by mouth.    . meclizine (ANTIVERT) 25 MG tablet Take 25 mg by mouth daily as needed for dizziness.     . Multiple Vitamins-Minerals (CENTRUM  CARDIO PO) Take 1 tablet by mouth daily.    . nitroGLYCERIN (NITROSTAT) 0.4 MG SL tablet Place 0.4 mg under the tongue every 5 (five) minutes as needed for chest pain.    . Omega 3 1000 MG CAPS Take 1 each by mouth 2 (two) times daily.    Marland Kitchen omeprazole (PRILOSEC) 40 MG capsule Take 40 mg by mouth daily.    . sotalol (BETAPACE) 160 MG tablet Take 1 tablet (160 mg total) by mouth 2 (two) times daily. 60 tablet 6   No current facility-administered medications for this visit.     REVIEW OF SYSTEMS:   Constitutional: Denies fevers, chills or night sweats Eyes: Denies blurriness of vision Ears, nose, mouth, throat, and face: Denies mucositis or sore throat Respiratory: Denies cough, dyspnea or wheezes Cardiovascular: Denies palpitation, chest discomfort or lower extremity swelling Gastrointestinal:  Denies nausea, heartburn or change in bowel habits Skin: Denies abnormal skin rashes Lymphatics: Denies new lymphadenopathy or easy bruising Neurological:Denies numbness, tingling or new weaknesses Behavioral/Psych: Mood is stable, no new changes  All other systems were reviewed with the patient and are negative.  PHYSICAL EXAMINATION: ECOG PERFORMANCE STATUS: 0 - Asymptomatic  Filed Vitals:   09/02/14 1407  BP: 132/69  Pulse: 57  Temp: 97.8 F (36.6 C)  Resp: 20   Filed Weights   09/02/14 1407  Weight: 132 lb 12.8 oz (60.238 kg)    GENERAL:alert, no distress and comfortable SKIN: skin color, texture, turgor are normal, no rashes or significant lesions EYES: normal, Conjunctiva are pink and non-injected, sclera clear OROPHARYNX:no exudate, no  erythema and lips, buccal mucosa, and tongue normal  NECK: supple, thyroid normal size, non-tender, without nodularity LYMPH:  no palpable lymphadenopathy in the cervical, axillary or inguinal LUNGS: clear to auscultation and percussion with normal breathing effort HEART: regular rate & rhythm and no murmurs and no lower extremity  edema ABDOMEN:abdomen soft, non-tender and normal bowel sounds Musculoskeletal:no cyanosis of digits and no clubbing  NEURO: alert & oriented x 3 with fluent speech, no focal motor/sensory deficits  LABORATORY DATA:  I have reviewed the data as listed Results for orders placed or performed in visit on 09/02/14 (from the past 48 hour(s))  Morphology     Status: None   Collection Time: 09/02/14  1:44 PM  Result Value Ref Range   Ovalocytes Moderate Negative   White Cell Comments Variant Lymphs    PLT EST Decreased Adequate   Platelet Morphology Few Large Platelets Within Normal Limits  Comprehensive metabolic panel     Status: Abnormal   Collection Time: 09/02/14  2:09 PM  Result Value Ref Range   Sodium 139 136 - 145 mEq/L   Potassium 4.4 3.5 - 5.1 mEq/L   Chloride 104 98 - 109 mEq/L   CO2 27 22 - 29 mEq/L   Glucose 105 70 - 140 mg/dl   BUN 18.7 7.0 - 26.0 mg/dL   Creatinine 0.9 0.6 - 1.1 mg/dL   Total Bilirubin 0.28 0.20 - 1.20 mg/dL   Alkaline Phosphatase 52 40 - 150 U/L   AST 21 5 - 34 U/L   ALT 21 0 - 55 U/L   Total Protein 6.8 6.4 - 8.3 g/dL   Albumin 3.9 3.5 - 5.0 g/dL   Calcium 9.2 8.4 - 10.4 mg/dL   Anion Gap 8 3 - 11 mEq/L   EGFR 60 (L) >90 ml/min/1.73 m2    Comment: eGFR is calculated using the CKD-EPI Creatinine Equation (2009)  CBC with Differential     Status: Abnormal   Collection Time: 09/02/14  2:09 PM  Result Value Ref Range   WBC 6.6 3.9 - 10.3 10e3/uL   NEUT# 3.0 1.5 - 6.5 10e3/uL   HGB 13.3 11.6 - 15.9 g/dL   HCT 38.7 34.8 - 46.6 %   Platelets 76 (L) 145 - 400 10e3/uL   MCV 86.6 79.5 - 101.0 fL   MCH 29.8 25.1 - 34.0 pg   MCHC 34.4 31.5 - 36.0 g/dL   RBC 4.47 3.70 - 5.45 10e6/uL   RDW 13.3 11.2 - 14.5 %   lymph# 3.0 0.9 - 3.3 10e3/uL   MONO# 0.5 0.1 - 0.9 10e3/uL   Eosinophils Absolute 0.1 0.0 - 0.5 10e3/uL   Basophils Absolute 0.0 0.0 - 0.1 10e3/uL   NEUT% 44.9 38.4 - 76.8 %   LYMPH% 45.8 14.0 - 49.7 %   MONO% 8.2 0.0 - 14.0 %   EOS% 0.9 0.0  - 7.0 %   BASO% 0.2 0.0 - 2.0 %   nRBC 0 0 - 0 %    Lab Results  Component Value Date   WBC 6.6 09/02/2014   HGB 13.3 09/02/2014   HCT 38.7 09/02/2014   MCV 86.6 09/02/2014   PLT 76* 09/02/2014    ASSESSMENT & PLAN:  Thrombocytopenia This is likely due to undiagnosed ITP. The recent drop is likely related to infection causing reduced production and increased consumption due to intermittent hemorrhoidal bleeding; since then has improved. I recommend additional multivitamin supplement for effective platelet production. As always to platelet  count stay above  50,000, she does not need any treatment. I will continue yearly blood work, history and physical examination. I recommend follow-up with PCP but only with blood work not more than once every 6 months unless needed for something else.   All questions were answered. The patient knows to call the clinic with any problems, questions or concerns. No barriers to learning was detected.  I spent 15 minutes counseling the patient face to face. The total time spent in the appointment was 20 minutes and more than 50% was on counseling.     [facility], [place], MD 09/02/2014 3:03 PM

## 2014-09-02 NOTE — Assessment & Plan Note (Signed)
This is likely due to undiagnosed ITP. The recent drop is likely related to infection causing reduced production and increased consumption due to intermittent hemorrhoidal bleeding; since then has improved. I recommend additional multivitamin supplement for effective platelet production. As always to platelet  count stay above 50,000, she does not need any treatment. I will continue yearly blood work, history and physical examination. I recommend follow-up with PCP but only with blood work not more than once every 6 months unless needed for something else.

## 2014-09-02 NOTE — Telephone Encounter (Signed)
s.w. pt and advised on OCT 2016 appt....pt ok and aware

## 2014-09-22 ENCOUNTER — Other Ambulatory Visit: Payer: Self-pay | Admitting: Interventional Cardiology

## 2014-09-23 ENCOUNTER — Telehealth: Payer: Self-pay | Admitting: *Deleted

## 2014-09-23 NOTE — Telephone Encounter (Signed)
Pt states she was at her doctor's office and she stepped down and felt a pain shoot up her leg.  Please call.

## 2014-10-02 ENCOUNTER — Telehealth: Payer: Self-pay | Admitting: *Deleted

## 2014-10-02 NOTE — Telephone Encounter (Signed)
"  My doctor is Dr. Paulla Dolly.  I have, I can't pronounce it, a fallen arch.  I injured my foot just by walking wrong.  I try to called on the 4th of January.  I thought it's not getting any better.  I wear the apparatus you gave me, I ice it and I put it in epsom salt.  I walk a mile every day.  I don't know, maybe I should see Dr. Paulla Dolly"  Dr. Paulla Dolly said to get her in tomorrow to see him.

## 2014-10-13 ENCOUNTER — Ambulatory Visit: Payer: Commercial Managed Care - HMO | Admitting: Podiatry

## 2014-10-16 ENCOUNTER — Encounter: Payer: Self-pay | Admitting: Podiatry

## 2014-10-16 ENCOUNTER — Ambulatory Visit (INDEPENDENT_AMBULATORY_CARE_PROVIDER_SITE_OTHER): Payer: Commercial Managed Care - HMO | Admitting: Podiatry

## 2014-10-16 DIAGNOSIS — M722 Plantar fascial fibromatosis: Secondary | ICD-10-CM

## 2014-10-16 DIAGNOSIS — M779 Enthesopathy, unspecified: Secondary | ICD-10-CM

## 2014-10-16 MED ORDER — TRIAMCINOLONE ACETONIDE 10 MG/ML IJ SUSP
10.0000 mg | Freq: Once | INTRAMUSCULAR | Status: AC
  Administered 2014-10-16: 10 mg

## 2014-10-16 NOTE — Progress Notes (Signed)
Subjective:     Patient ID: [name], female   DOB: 07/08/1933, 81 y.o.   MRN: [id number]  HPI patient states that she twisted her right ankle and developed a lot of pain after that occurred. Also complains of mild plantar pain still noted but not to the same degree as previously. Does not remember specific injury just that she twisted her ankle   Review of Systems     Objective:   Physical Exam Neurovascular status intact with muscle strength adequate range of motion within normal limits. Patient's right ankle within the sinus tarsi is quite inflamed and sore and there is mild to moderate discomfort in the plantar aspect of the right heel lateral side    Assessment:     Sinus tarsitis secondary to injury right with mild plantar fascial symptomatology right    Plan:     X-rays reviewed and today I injected the sinus tarsi right 3 mg Kenalog 5 mg Xylocaine and discussed the plantar fasciitis with recommendations for stretching physical therapy and supportive shoe gear accommodation. Reappoint if symptoms remain

## 2014-10-27 ENCOUNTER — Ambulatory Visit (INDEPENDENT_AMBULATORY_CARE_PROVIDER_SITE_OTHER): Payer: Commercial Managed Care - HMO | Admitting: Podiatry

## 2014-10-27 DIAGNOSIS — M722 Plantar fascial fibromatosis: Secondary | ICD-10-CM

## 2014-10-27 DIAGNOSIS — S93401D Sprain of unspecified ligament of right ankle, subsequent encounter: Secondary | ICD-10-CM

## 2014-10-27 MED ORDER — TRIAMCINOLONE ACETONIDE 10 MG/ML IJ SUSP
10.0000 mg | Freq: Once | INTRAMUSCULAR | Status: AC
  Administered 2014-10-27: 10 mg

## 2014-10-27 NOTE — Progress Notes (Signed)
RIGHT ANKLE IS STILL HURTING

## 2014-10-28 ENCOUNTER — Telehealth: Payer: Self-pay | Admitting: *Deleted

## 2014-10-28 NOTE — Telephone Encounter (Signed)
Pt states she took her brace off and behind her toes is the worse athletes foot she has ever seen.  Pt states she soaked her feet in epsom salts water 3 days ago and it wasn't there.  I told pt I would feel better and so would she if she was seen by one of our doctors.  Pt agreed and I transferred to schedulers.

## 2014-10-29 ENCOUNTER — Ambulatory Visit (INDEPENDENT_AMBULATORY_CARE_PROVIDER_SITE_OTHER): Payer: Commercial Managed Care - HMO | Admitting: Podiatry

## 2014-10-29 DIAGNOSIS — S93401D Sprain of unspecified ligament of right ankle, subsequent encounter: Secondary | ICD-10-CM | POA: Diagnosis not present

## 2014-10-29 DIAGNOSIS — L309 Dermatitis, unspecified: Secondary | ICD-10-CM

## 2014-10-29 NOTE — Progress Notes (Signed)
Subjective:     Patient ID: [name], female   DOB: 1932-11-11, 81 y.o.   MRN: [id number]  HPI patient states I have a sprained ankle right that bothering me and I'm continuing develop pain in the plantar of my right heel even though it seems more in the center and maybe the outside than it is on the inside at this time. States that her ankle feels unstable currently and that she is worried that it we'll sprain and a worse when   Review of Systems     Objective:   Physical Exam Neurovascular status intact with muscle strength adequate and range of motion within normal limits. Patient does have discomfort when I moved the ankle and inverted and everted fashion but I did not note any ligamentous laxity. The plantar heel is painful but more in the center and lateral portion of the heel    Assessment:     Sprained ankle right with inversion type sprain and plantar fascial symptoms right    Plan:     Careful injection of the plantar fascia right from the lateral side 3 mg Kenalog 5 mg Xylocaine and placed into a Tri-Lock ankle brace in order to support and prevent inversion like sprain from occurring. Patient stated it fit well and felt better and she'll be seen back in 4 weeks

## 2014-10-29 NOTE — Progress Notes (Signed)
Subjective:     Patient ID: [name], female   DOB: 1932/12/16, 81 y.o.   MRN: [id number]  HPI patient presents concerned about irritation between her toes stating that the heel seems to be feeling quite a bit better   Review of Systems     Objective:   Physical Exam Neurovascular status unchanged with slight irritation between the digits of the lesser toes bilateral especially between the fourth and fifth toe right    Assessment:     Mild fungal infection with crack skin in the folds of the digits fourth and fifth of both feet    Plan:     Advised on Vaseline in sync treatment along with soaks and reappoint if symptoms persist or get worse

## 2015-03-18 ENCOUNTER — Other Ambulatory Visit: Payer: Self-pay | Admitting: Interventional Cardiology

## 2015-03-18 ENCOUNTER — Other Ambulatory Visit: Payer: Self-pay

## 2015-03-18 MED ORDER — SOTALOL HCL 160 MG PO TABS: ORAL_TABLET | ORAL | Status: DC

## 2015-04-26 DIAGNOSIS — Z9889 Other specified postprocedural states: Secondary | ICD-10-CM | POA: Insufficient documentation

## 2015-04-26 NOTE — Progress Notes (Signed)
Cardiology Office Note   Date:  04/26/2015   ID:  [name], DOB [birth year], MRN [id number]  PCP:  [name], MD  Cardiologist:  Sinclair Grooms, MD   Chief Complaint  Patient presents with  . Coronary Artery Disease      History of Present Illness: [name] is a 81 y.o. female who presents for HOCM, septal myectomy 2010, CABG 2010, and MV repair 2010. Has h/o PAF and not on anticoagulation due to prior intracranial hemorrhage.  She is doing well. She is active. She has not had syncope or palpitations. She denies angina. There is no orthopnea or PND.  Past Medical History  Diagnosis Date  . Thrombocytopenia   . Fatigue   . CAD (coronary artery disease) 2003    on Plavix.   Marland Kitchen Hemorrhagic stroke 2010  . Hypercholesteremia   . Pancreatitis 1970  . Unspecified deficiency anemia   . Osteoporosis   . Hiatal hernia   . Kidney stones   . Bilateral hearing loss   . Pancreatitis     Past Surgical History  Procedure Laterality Date  . Cardiac valve surgery  2010  . Coronary artery bypass graft  210  . Appendectomy    . Tubal ligation    . Colonoscopy      [facility], Dr. Wynetta Emery  . Colonoscopy with propofol N/A 04/09/2013    Procedure: COLONOSCOPY WITH PROPOFOL;  Surgeon: Garlan Fair, MD;  Location: [facility];  Service: Endoscopy;  Laterality: N/A;     Current Outpatient Prescriptions  Medication Sig Dispense Refill  . calcium citrate (CALCITRATE - DOSED IN MG ELEMENTAL CALCIUM) 950 MG tablet Take 1 tablet by mouth daily.    . Cholecalciferol (VITAMIN D-3 PO) Take 1 tablet by mouth daily.    Marland Kitchen EPINEPHrine (ADRENALIN) 1 MG/ML injection 1 mg once.    . ezetimibe (ZETIA) 10 MG tablet Take 1 tablet (10 mg total) by mouth daily. 30 tablet 6  . fexofenadine (ALLEGRA) 180 MG tablet Take 90 mg by mouth daily.     Marland Kitchen Lysine 500 MG CAPS Take by mouth.    . meclizine (ANTIVERT) 25 MG tablet Take 25 mg by mouth daily as needed for dizziness.       . Multiple Vitamins-Minerals (CENTRUM CARDIO PO) Take 1 tablet by mouth daily.    . nitroGLYCERIN (NITROSTAT) 0.4 MG SL tablet Place 0.4 mg under the tongue every 5 (five) minutes as needed for chest pain.    . Omega 3 1000 MG CAPS Take 1 each by mouth 2 (two) times daily.    Marland Kitchen omeprazole (PRILOSEC) 40 MG capsule Take 40 mg by mouth daily.    . sotalol (BETAPACE) 160 MG tablet TAKE 1 TABLET BY MOUTH TWICE A DAY 60 tablet 5   No current facility-administered medications for this visit.    Allergies:   Bee venom; Lyrica; Ambien; Aspirin; Celebrex; Codeine; Darvocet; Demerol; Fosamax; Lipitor; Morphine and related; Valium; and Iodine    Social History:  The patient  reports that she has never smoked. She has never used smokeless tobacco. She reports that she does not drink alcohol or use illicit drugs.   Family History:  The patient's family history includes Cancer in her brother and sister; Cancer (age of onset: 60) in her mother; Heart attack in her father.    ROS:  Please see the history of present illness.   Otherwise, review of systems are positive for knee and joint discomfort  but otherwise unremarkable..   All other systems are reviewed and negative.    PHYSICAL EXAM: VS:  There were no vitals taken for this visit. , BMI There is no weight on file to calculate BMI. GEN: Well nourished, well developed, in no acute distress HEENT: normal Neck: no JVD, carotid bruits, or masses Cardiac: RRR; no murmurs, rubs, or gallops,no edema  Respiratory:  clear to auscultation bilaterally, normal work of breathing GI: soft, nontender, nondistended, + BS MS: no deformity or atrophy Skin: warm and dry, no rash Neuro:  Strength and sensation are intact Psych: euthymic mood, full affect   EKG:  EKG is ordered today. The ekg ordered today demonstrates sinus bradycardia at 49 bpm with poor R-wave progression V1 through V3. No change compared to prior. LVH is noted.   Recent Labs: 09/02/2014:  ALT 21; BUN 18.7; Creatinine 0.9; HGB 13.3; Platelets 76*; Potassium 4.4; Sodium 139    Lipid Panel    Component Value Date/Time   CHOL  07/22/2009 0945    120        ATP III CLASSIFICATION:  <200     mg/dL   Desirable  200-239  mg/dL   Borderline High  >=240    mg/dL   High          TRIG 82 07/22/2009 0945   HDL 31* 07/22/2009 0945   CHOLHDL 3.9 07/22/2009 0945   VLDL 16 07/22/2009 0945   LDLCALC  07/22/2009 0945    73        Total Cholesterol/HDL:CHD Risk Coronary Heart Disease Risk Table                     Men   Women  1/2 Average Risk   3.4   3.3  Average Risk       5.0   4.4  2 X Average Risk   9.6   7.1  3 X Average Risk  23.4   11.0        Use the calculated Patient Ratio above and the CHD Risk Table to determine the patient's CHD Risk.        ATP III CLASSIFICATION (LDL):  <100     mg/dL   Optimal  100-129  mg/dL   Near or Above                    Optimal  130-159  mg/dL   Borderline  160-189  mg/dL   High  >190     mg/dL   Very High      Wt Readings from Last 3 Encounters:  09/02/14 60.238 kg (132 lb 12.8 oz)  04/08/14 58.06 kg (128 lb)  09/02/13 64.139 kg (141 lb 6.4 oz)      Other studies Reviewed: Additional studies/ records that were reviewed today include: None. Review of the above records demonstrates: Will request recent laboratory data from Dr. [name]   ASSESSMENT AND PLAN:  1. Coronary artery disease involving coronary bypass graft of native heart without angina pectoris Asymptomatic  2. Hypertrophic obstructive cardiomyopathy Asymptomatic  3. Paroxysmal atrial fibrillation No recent clinical recurrences. Believed to be suppressed with sotalol.  4. S/P mitral valve repair No murmurs heard  5. CVA (cerebral vascular accident) Had postop CVA after myomectomy and valve repair    Current medicines are reviewed at length with the patient today.  The patient does not have concerns regarding medicines.  The following changes  have been made:  Patient  needs an echocardiogram to follow-up on hypertrophic cardiomyopathy and mitral valve repair  Labs/ tests ordered today include:  No orders of the defined types were placed in this encounter.     Disposition:   FU with HS in 1 year  Signed, Sinclair Grooms, MD  04/26/2015 8:46 AM    [facility] [address], [place], [place]  [address] Phone: [phone]; Fax: [phone]

## 2015-04-28 ENCOUNTER — Encounter: Payer: Self-pay | Admitting: Interventional Cardiology

## 2015-04-28 ENCOUNTER — Ambulatory Visit (INDEPENDENT_AMBULATORY_CARE_PROVIDER_SITE_OTHER): Payer: Commercial Managed Care - HMO | Admitting: Interventional Cardiology

## 2015-04-28 VITALS — BP 144/82 | HR 49 | Ht 61.0 in | Wt 129.6 lb

## 2015-04-28 DIAGNOSIS — Z9889 Other specified postprocedural states: Secondary | ICD-10-CM | POA: Diagnosis not present

## 2015-04-28 DIAGNOSIS — I421 Obstructive hypertrophic cardiomyopathy: Secondary | ICD-10-CM

## 2015-04-28 DIAGNOSIS — I639 Cerebral infarction, unspecified: Secondary | ICD-10-CM

## 2015-04-28 DIAGNOSIS — I480 Paroxysmal atrial fibrillation: Secondary | ICD-10-CM | POA: Diagnosis not present

## 2015-04-28 DIAGNOSIS — I25810 Atherosclerosis of coronary artery bypass graft(s) without angina pectoris: Secondary | ICD-10-CM | POA: Diagnosis not present

## 2015-04-28 NOTE — Patient Instructions (Signed)
Medication Instructions:  Your physician recommends that you continue on your current medications as directed. Please refer to the Current Medication list given to you today.   Labwork: None   Testing/Procedures: Your physician has requested that you have an echocardiogram. Echocardiography is a painless test that uses sound waves to create images of your heart. It provides your doctor with information about the size and shape of your heart and how well your heart's chambers and valves are working. This procedure takes approximately one hour. There are no restrictions for this procedure.    Follow-Up: Your physician wants you to follow-up in: 1 year with Dr.Smith You will receive a reminder letter in the mail two months in advance. If you don't receive a letter, please call our office to schedule the follow-up appointment.   Any Other Special Instructions Will Be Listed Below (If Applicable).   

## 2015-05-04 ENCOUNTER — Ambulatory Visit (HOSPITAL_COMMUNITY): Payer: Commercial Managed Care - HMO | Attending: Interventional Cardiology

## 2015-05-04 ENCOUNTER — Other Ambulatory Visit: Payer: Self-pay

## 2015-05-04 DIAGNOSIS — E785 Hyperlipidemia, unspecified: Secondary | ICD-10-CM | POA: Diagnosis not present

## 2015-05-04 DIAGNOSIS — Z8249 Family history of ischemic heart disease and other diseases of the circulatory system: Secondary | ICD-10-CM | POA: Diagnosis not present

## 2015-05-04 DIAGNOSIS — I517 Cardiomegaly: Secondary | ICD-10-CM | POA: Insufficient documentation

## 2015-05-04 DIAGNOSIS — I421 Obstructive hypertrophic cardiomyopathy: Secondary | ICD-10-CM

## 2015-05-04 DIAGNOSIS — I422 Other hypertrophic cardiomyopathy: Secondary | ICD-10-CM | POA: Diagnosis present

## 2015-05-04 DIAGNOSIS — I351 Nonrheumatic aortic (valve) insufficiency: Secondary | ICD-10-CM | POA: Insufficient documentation

## 2015-05-06 ENCOUNTER — Telehealth: Payer: Self-pay

## 2015-05-06 NOTE — Telephone Encounter (Signed)
Pt aware of echo results. Heart is doing well. Very reassurring study. Pt verbalized understanding

## 2015-05-06 NOTE — Telephone Encounter (Signed)
Called to give pt echo results.lmtcb 

## 2015-05-06 NOTE — Telephone Encounter (Signed)
Follow up     Pt is calling for her results  Please call pt

## 2015-05-06 NOTE — Telephone Encounter (Signed)
-----   Message from Belva Crome, MD sent at 05/04/2015  8:12 PM EDT ----- Heart is doing well. Very reassurring study.

## 2015-05-07 ENCOUNTER — Encounter: Payer: Self-pay | Admitting: Interventional Cardiology

## 2015-07-03 ENCOUNTER — Encounter: Payer: Self-pay | Admitting: Hematology and Oncology

## 2015-07-03 ENCOUNTER — Other Ambulatory Visit (HOSPITAL_BASED_OUTPATIENT_CLINIC_OR_DEPARTMENT_OTHER): Payer: Commercial Managed Care - HMO

## 2015-07-03 ENCOUNTER — Telehealth: Payer: Self-pay | Admitting: Obstetrics & Gynecology

## 2015-07-03 ENCOUNTER — Ambulatory Visit (HOSPITAL_BASED_OUTPATIENT_CLINIC_OR_DEPARTMENT_OTHER): Payer: Commercial Managed Care - HMO | Admitting: Hematology and Oncology

## 2015-07-03 VITALS — BP 142/49 | HR 50 | Resp 18 | Ht 61.0 in | Wt 128.7 lb

## 2015-07-03 DIAGNOSIS — R5382 Chronic fatigue, unspecified: Secondary | ICD-10-CM

## 2015-07-03 DIAGNOSIS — D696 Thrombocytopenia, unspecified: Secondary | ICD-10-CM

## 2015-07-03 DIAGNOSIS — R310 Gross hematuria: Secondary | ICD-10-CM

## 2015-07-03 DIAGNOSIS — Z23 Encounter for immunization: Secondary | ICD-10-CM

## 2015-07-03 DIAGNOSIS — I639 Cerebral infarction, unspecified: Secondary | ICD-10-CM | POA: Diagnosis not present

## 2015-07-03 HISTORY — DX: Gross hematuria: R31.0

## 2015-07-03 LAB — URINALYSIS, MICROSCOPIC - CHCC
BILIRUBIN (URINE): NEGATIVE
GLUCOSE UR CHCC: NEGATIVE mg/dL
Ketones: NEGATIVE mg/dL
NITRITE: NEGATIVE
PH: 7.5 (ref 4.6–8.0)
PROTEIN: NEGATIVE mg/dL
Specific Gravity, Urine: 1.01 (ref 1.003–1.035)
UROBILINOGEN UR: 0.2 mg/dL (ref 0.2–1)

## 2015-07-03 LAB — MORPHOLOGY: PLT EST: DECREASED

## 2015-07-03 LAB — CBC WITH DIFFERENTIAL/PLATELET
BASO%: 0.2 % (ref 0.0–2.0)
BASOS ABS: 0 10*3/uL (ref 0.0–0.1)
EOS ABS: 0.1 10*3/uL (ref 0.0–0.5)
EOS%: 1.7 % (ref 0.0–7.0)
HEMATOCRIT: 40 % (ref 34.8–46.6)
HEMOGLOBIN: 13.4 g/dL (ref 11.6–15.9)
LYMPH#: 2.2 10*3/uL (ref 0.9–3.3)
LYMPH%: 40.4 % (ref 14.0–49.7)
MCH: 29.2 pg (ref 25.1–34.0)
MCHC: 33.5 g/dL (ref 31.5–36.0)
MCV: 87.1 fL (ref 79.5–101.0)
MONO#: 0.7 10*3/uL (ref 0.1–0.9)
MONO%: 12.4 % (ref 0.0–14.0)
NEUT#: 2.4 10*3/uL (ref 1.5–6.5)
NEUT%: 45.3 % (ref 38.4–76.8)
PLATELETS: 66 10*3/uL — AB (ref 145–400)
RBC: 4.59 10*6/uL (ref 3.70–5.45)
RDW: 13.5 % (ref 11.2–14.5)
WBC: 5.3 10*3/uL (ref 3.9–10.3)

## 2015-07-03 MED ORDER — INFLUENZA VAC SPLIT QUAD 0.5 ML IM SUSY
0.5000 mL | PREFILLED_SYRINGE | Freq: Once | INTRAMUSCULAR | Status: AC
  Administered 2015-07-03: 0.5 mL via INTRAMUSCULAR
  Filled 2015-07-03: qty 0.5

## 2015-07-03 NOTE — Progress Notes (Signed)
[place] NOTE  Adrienne Naas, MD SUMMARY OF HEMATOLOGIC HISTORY:  This is a pleasant lady with chronic thrombocytopenia. The patient was discovered to have low platelet count when she was placed on Plavix for coronary artery disease. 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 She is being observed. INTERVAL HISTORY: [name] 81 y.o. female returns for further follow-up. She complained of intermittent gross hematuria over the past few months, average about once a month. She denies excessive bruising. She complained of chronic fatigue. Denies recent infection.   I have reviewed the past medical history, past surgical history, social history and family history with the patient and they are unchanged from previous note.  ALLERGIES:  is allergic to bee venom; lyrica; codeine; demerol; ambien; aspirin; celebrex; darvocet; fosamax; lipitor; morphine and related; valium; and iodine.  MEDICATIONS:  Current Outpatient Prescriptions  Medication Sig Dispense Refill  . calcium citrate (CALCITRATE - DOSED IN MG ELEMENTAL CALCIUM) 950 MG tablet Take 1 tablet by mouth daily.    . Cholecalciferol (VITAMIN D-3 PO) Take 1 tablet by mouth daily.    Marland Kitchen EPINEPHrine (ADRENALIN) 1 MG/ML injection Inject 1 mg as directed as needed for anaphylaxis.     Marland Kitchen ezetimibe (ZETIA) 10 MG tablet Take 1 tablet (10 mg total) by mouth daily. 30 tablet 6  . fexofenadine (ALLEGRA) 180 MG tablet Take 90 mg by mouth daily.     Marland Kitchen Lysine 500 MG CAPS Take 1 capsule by mouth 2 (two) times daily as needed (fever blisters).     . meclizine (ANTIVERT) 25 MG tablet Take 25 mg by mouth daily as needed for dizziness.     . nitroGLYCERIN (NITROSTAT) 0.4 MG SL tablet Place 0.4 mg under the tongue every 5 (five) minutes as  needed for chest pain.    . Omega 3 1000 MG CAPS Take 1 each by mouth 2 (two) times daily.    Marland Kitchen omeprazole (PRILOSEC) 40 MG capsule Take 40 mg by mouth daily.    . Prenatal Vit-Fe Fumarate-FA (PRENATAL VITAMIN PO) Take 1 tablet by mouth 2 (two) times daily.    . sotalol (BETAPACE) 160 MG tablet TAKE 1 TABLET BY MOUTH TWICE A DAY 60 tablet 5   No current facility-administered medications for this visit.     REVIEW OF SYSTEMS:   Constitutional: Denies fevers, chills or night sweats Eyes: Denies blurriness of vision Ears, nose, mouth, throat, and face: Denies mucositis or sore throat Respiratory: Denies cough, dyspnea or wheezes Cardiovascular: Denies palpitation, chest discomfort or lower extremity swelling Gastrointestinal:  Denies nausea, heartburn or change in bowel habits Skin: Denies abnormal skin rashes Lymphatics: Denies new lymphadenopathy or easy bruising Neurological:Denies numbness, tingling or new weaknesses Behavioral/Psych: Mood is stable, no new changes  All other systems were reviewed with the patient and are negative.  PHYSICAL EXAMINATION: ECOG PERFORMANCE STATUS: 0 - Asymptomatic  Filed Vitals:   07/03/15 1018  BP: 142/49  Pulse: 50  Resp: 18   Filed Weights   07/03/15 1018  Weight: 128 lb 11.2 oz (58.378 kg)    GENERAL:alert, no distress and comfortable SKIN: skin color, texture, turgor are normal, no rashes or significant lesions EYES: normal, Conjunctiva are pink and non-injected, sclera clear OROPHARYNX:no exudate, no erythema and lips, buccal mucosa, and tongue normal  NECK: supple, thyroid normal size, non-tender, without  nodularity LYMPH:  no palpable lymphadenopathy in the cervical, axillary or inguinal LUNGS: clear to auscultation and percussion with normal breathing effort HEART: regular rate & rhythm and no murmurs and no lower extremity edema ABDOMEN:abdomen soft, non-tender and normal bowel sounds Musculoskeletal:no cyanosis of digits and no  clubbing  NEURO: alert & oriented x 3 with fluent speech, no focal motor/sensory deficits  LABORATORY DATA:  I have reviewed the data as listed Results for orders placed or performed in visit on 07/03/15 (from the past 48 hour(s))  CBC with Differential     Status: Abnormal   Collection Time: 07/03/15 10:02 AM  Result Value Ref Range   WBC 5.3 3.9 - 10.3 10e3/uL   NEUT# 2.4 1.5 - 6.5 10e3/uL   HGB 13.4 11.6 - 15.9 g/dL   HCT 40.0 34.8 - 46.6 %   Platelets 66 (L) 145 - 400 10e3/uL   MCV 87.1 79.5 - 101.0 fL   MCH 29.2 25.1 - 34.0 pg   MCHC 33.5 31.5 - 36.0 g/dL   RBC 4.59 3.70 - 5.45 10e6/uL   RDW 13.5 11.2 - 14.5 %   lymph# 2.2 0.9 - 3.3 10e3/uL   MONO# 0.7 0.1 - 0.9 10e3/uL   Eosinophils Absolute 0.1 0.0 - 0.5 10e3/uL   Basophils Absolute 0.0 0.0 - 0.1 10e3/uL   NEUT% 45.3 38.4 - 76.8 %   LYMPH% 40.4 14.0 - 49.7 %   MONO% 12.4 0.0 - 14.0 %   EOS% 1.7 0.0 - 7.0 %   BASO% 0.2 0.0 - 2.0 %  Morphology     Status: None   Collection Time: 07/03/15 10:02 AM  Result Value Ref Range   Ovalocytes Moderate Negative   White Cell Comments Variant Lymphs    PLT EST Decreased Adequate   Platelet Morphology Large and giant platelets Within Normal Limits    Lab Results  Component Value Date   WBC 5.3 07/03/2015   HGB 13.4 07/03/2015   HCT 40.0 07/03/2015   MCV 87.1 07/03/2015   PLT 66* 07/03/2015    ASSESSMENT & PLAN:  Thrombocytopenia Suspect the cause of thrombocytopenia is due to consumption in the setting of valvular heart disease. Her blood count remained stable with 50,000. I do not believe this is the cause of her gross hematuria. Further thrombocytopenia, I will see her back next year for evaluation.  Gross hematuria She has gross hematuria of unknown etiology. I will order urinalysis and urine culture. In addition, I will also order her kidneys to exclude kidney pathology. If no cause is found, I will refer her to urologist for further evaluation  CVA (cerebral  vascular accident) [facility]) She has no residual deficit from history of stroke. Her platelet count greater than 50,000, there is no contraindication for her to remain on antiplatelet agents   Fatigue She has chronic fatigue of unknown etiology. I told the patient her hemoglobin is normal and that is not the cause of her fatigue. I will defer to her primary doctor for further management.  We discussed the importance of preventive care and reviewed the vaccination programs. She does not have any prior allergic reactions to influenza vaccination. She agrees to proceed with influenza vaccination today and we will administer it today at the clinic.  All questions were answered. The patient knows to call the clinic with any problems, questions or concerns. No barriers to learning was detected.  I spent 25 minutes counseling the patient face to face. The total time spent in the  appointment was 30 minutes and more than 50% was on counseling.     [facility], Jamontae Thwaites, MD 10/14/201612:46 PM

## 2015-07-03 NOTE — Assessment & Plan Note (Signed)
She has gross hematuria of unknown etiology. I will order urinalysis and urine culture. In addition, I will also order her kidneys to exclude kidney pathology. If no cause is found, I will refer her to urologist for further evaluation

## 2015-07-03 NOTE — Assessment & Plan Note (Signed)
She has no residual deficit from history of stroke. Her platelet count greater than 50,000, there is no contraindication for her to remain on antiplatelet agents

## 2015-07-03 NOTE — Assessment & Plan Note (Addendum)
She has chronic fatigue of unknown etiology. I told the patient her hemoglobin is normal and that is not the cause of her fatigue. I will defer to her primary doctor for further management.

## 2015-07-03 NOTE — Assessment & Plan Note (Signed)
Suspect the cause of thrombocytopenia is due to consumption in the setting of valvular heart disease. Her blood count remained stable with 50,000. I do not believe this is the cause of her gross hematuria. Further thrombocytopenia, I will see her back next year for evaluation.

## 2015-07-03 NOTE — Telephone Encounter (Signed)
Gave patient avs report and appointments for August. [facility] will call re [place] appointment - patient aware. Patient sent back to lab.

## 2015-07-04 LAB — URINE CULTURE

## 2015-07-06 ENCOUNTER — Telehealth: Payer: Self-pay | Admitting: *Deleted

## 2015-07-06 NOTE — Telephone Encounter (Signed)
-----   Message from Heath Lark, MD sent at 07/06/2015  8:30 AM EDT ----- Regarding: please let her know urine cx neg   ----- Message -----    From: Lab in [facility] Interface    Sent: 07/03/2015  11:36 AM      To: Heath Lark, MD

## 2015-07-06 NOTE — Telephone Encounter (Signed)
LVM for pt informing "test results negative" asked her to call nurse back for more detail.  Unsure how much detail to leave on VM

## 2015-07-09 ENCOUNTER — Ambulatory Visit (HOSPITAL_COMMUNITY): Admission: RE | Admit: 2015-07-09 | Payer: Commercial Managed Care - HMO | Source: Ambulatory Visit

## 2015-07-09 ENCOUNTER — Telehealth: Payer: Self-pay | Admitting: *Deleted

## 2015-07-09 NOTE — Telephone Encounter (Signed)
Pt came to [facility] and states she was wrong when she thought the bleeding was coming from her vagina. Realized it was hemorrhoids. [place] ABD cancelled and pt instructed to see GI MD who performed previous colonoscopy.

## 2015-07-30 ENCOUNTER — Other Ambulatory Visit: Payer: Self-pay | Admitting: Gastroenterology

## 2015-08-03 ENCOUNTER — Encounter (HOSPITAL_COMMUNITY): Payer: Self-pay | Admitting: *Deleted

## 2015-08-03 ENCOUNTER — Encounter (HOSPITAL_COMMUNITY)
Admission: RE | Disposition: A | Discharge status: Home / Self Care | Payer: Self-pay | Source: Ambulatory Visit | Attending: Gastroenterology

## 2015-08-03 ENCOUNTER — Ambulatory Visit (HOSPITAL_COMMUNITY)
Admission: RE | Admit: 2015-08-03 | Discharge: 2015-08-03 | Disposition: A | Discharge status: Home / Self Care | Payer: Commercial Managed Care - HMO | Source: Ambulatory Visit | Attending: Gastroenterology | Admitting: Gastroenterology

## 2015-08-03 ENCOUNTER — Other Ambulatory Visit: Payer: Self-pay | Admitting: Gastroenterology

## 2015-08-03 DIAGNOSIS — Z800 Family history of malignant neoplasm of digestive organs: Secondary | ICD-10-CM | POA: Insufficient documentation

## 2015-08-03 DIAGNOSIS — K648 Other hemorrhoids: Secondary | ICD-10-CM | POA: Diagnosis not present

## 2015-08-03 DIAGNOSIS — Z86010 Personal history of colonic polyps: Secondary | ICD-10-CM | POA: Insufficient documentation

## 2015-08-03 DIAGNOSIS — D693 Immune thrombocytopenic purpura: Secondary | ICD-10-CM | POA: Diagnosis not present

## 2015-08-03 DIAGNOSIS — K921 Melena: Secondary | ICD-10-CM | POA: Diagnosis not present

## 2015-08-03 HISTORY — PX: FLEXIBLE SIGMOIDOSCOPY: SHX5431

## 2015-08-03 SURGERY — SIGMOIDOSCOPY, FLEXIBLE

## 2015-08-03 SURGERY — SIGMOIDOSCOPY, FLEXIBLE
Anesthesia: Moderate Sedation

## 2015-08-03 MED ORDER — FLEET ENEMA 7-19 GM/118ML RE ENEM
1.0000 | ENEMA | Freq: Once | RECTAL | Status: AC
  Administered 2015-08-03: 1 via RECTAL

## 2015-08-03 MED ORDER — FLEET ENEMA 7-19 GM/118ML RE ENEM
ENEMA | RECTAL | Status: AC
  Filled 2015-08-03: qty 1

## 2015-08-03 NOTE — Discharge Instructions (Signed)
Flexible Sigmoidoscopy, Care After  Refer to this sheet in the next few weeks. These instructions provide you with information on caring for yourself after your procedure. Your health care provider may also give you more specific instructions. Your treatment has been planned according to current medical practices, but problems sometimes occur. Call your health care provider if you have any problems or questions after your procedure.  WHAT TO EXPECT AFTER THE PROCEDURE  After your procedure, it is typical to have the following:   · Abdominal cramps.  · Bloating.  · A small amount of rectal bleeding if you had a biopsy.  HOME CARE INSTRUCTIONS  · Only take over-the-counter or prescription medicines for pain, fever, or discomfort as directed by your health care provider.  · Resume your normal diet and activities as directed by your health care provider.  SEEK MEDICAL CARE IF:  · You have abdominal pain or cramping that lasts longer than 1 hour after the procedure.  · You continue to have small amounts of rectal bleeding after 24 hours.  · You have nausea or vomiting.  · You feel weak or dizzy.  SEEK IMMEDIATE MEDICAL CARE IF:   · You have a fever.  · You pass large blood clots or see a large amount of blood in the toilet after having a bowel movement. This may also occur 10-14 days after the procedure. It is more likely if you had a biopsy.  · You develop abdominal pain that is not relieved with medicine or your abdominal pain gets worse.  · You have nausea or vomiting for more than 24 hours after the procedure.     This information is not intended to replace advice given to you by your health care provider. Make sure you discuss any questions you have with your health care provider.     Document Released: 09/10/2013 Document Reviewed: 09/10/2013  Elsevier Interactive Patient Education ©2016 [employer]

## 2015-08-03 NOTE — H&P (Signed)
  Problem: Hematochezia associated with normal hemoglobin. Immune thrombocytopenia. Normal screening colonoscopies performed in 1999, 2002, 2008. Mother and sister diagnosed with colon cancer. Prolapsed internal hemorrhoids.  History: The patient is an 80 year old female born 08/16/1933. In July 2014, the patient underwent a surveillance colonoscopy with removal of all adenomatous colon polyps. Prolapsed internal hemorrhoids were present.  Following a bout of small volume hematochezia, the patient underwent diagnostic anoscopy which showed an inflamed lesion in the rectum.  She is scheduled to undergo diagnostic proctoscopic exam usinga day.  Exam: The patient is alert and lying comfortably on the endoscopy stretcher. Cardiac exam reveals a regular rhythm. Lungs are clear to auscultation. Abdomen is soft and nontender to palpation.  Plan: Proceed with diagnostic proctoscopic exam

## 2015-08-03 NOTE — Op Note (Signed)
Problem: Hematochezia. Normal screening colonoscopies performed in 1999, 2002, 2008. July 2014, screening colonoscopy performed with removal of small adenomatous colon polyps. Prolapsed internal hemorrhoids. Immune thrombocytopenia.  Endoscopist: Earle Gell  Premedication: None  Procedure: Diagnostic flexible proctosigmoidoscopy The patient was placed in the left lateral decubitus position. Anal inspection reveals a small prolapse internal hemorrhoid. Digital rectal exam was normal. The Pentax pediatric colonoscope was introduced into the rectum and advanced to 55 cm from the anal verge. Colonic preparation for the exam today was excellent.  Rectum. Normal. Retroflexed view of the distal rectum showed large internal hemorrhoids which weren't bleeding.  Sigmoid colon and descending colon. Normal  Assessment: Normal flexible proctosigmoidoscopy performed to 55 cm. Prolapsed internal hemorrhoids.

## 2015-08-04 ENCOUNTER — Encounter (HOSPITAL_COMMUNITY): Payer: Self-pay | Admitting: Gastroenterology

## 2015-09-11 ENCOUNTER — Other Ambulatory Visit: Payer: Self-pay | Admitting: Interventional Cardiology

## 2015-09-20 HISTORY — PX: CATARACT EXTRACTION: SUR2

## 2015-12-17 ENCOUNTER — Telehealth: Payer: Self-pay | Admitting: *Deleted

## 2015-12-17 ENCOUNTER — Telehealth: Payer: Self-pay | Admitting: Hematology and Oncology

## 2015-12-17 NOTE — Telephone Encounter (Signed)
Its' scheduled for 4/17 [name] should have already called the patient

## 2015-12-17 NOTE — Telephone Encounter (Signed)
S.w. Pt and adivsed on April appt

## 2015-12-17 NOTE — Telephone Encounter (Signed)
Dr Duwaine Maxin Smith's office left message stating patient needs to be seen sooner than August 2017 as her labs are low. Suggest being seen in May.  Dr Alvy Bimler office LM for them to fax [place] her lab results for Dr Alvy Bimler to review.

## 2016-01-01 ENCOUNTER — Other Ambulatory Visit: Payer: Self-pay | Admitting: Hematology and Oncology

## 2016-01-01 DIAGNOSIS — D696 Thrombocytopenia, unspecified: Secondary | ICD-10-CM

## 2016-01-04 ENCOUNTER — Ambulatory Visit (HOSPITAL_BASED_OUTPATIENT_CLINIC_OR_DEPARTMENT_OTHER): Payer: Commercial Managed Care - HMO | Admitting: Hematology and Oncology

## 2016-01-04 ENCOUNTER — Other Ambulatory Visit (HOSPITAL_BASED_OUTPATIENT_CLINIC_OR_DEPARTMENT_OTHER): Payer: Commercial Managed Care - HMO

## 2016-01-04 ENCOUNTER — Telehealth: Payer: Self-pay | Admitting: Hematology and Oncology

## 2016-01-04 ENCOUNTER — Encounter: Payer: Self-pay | Admitting: Hematology and Oncology

## 2016-01-04 VITALS — BP 157/60 | HR 51 | Temp 97.4°F | Resp 18 | Ht 61.0 in | Wt 135.3 lb

## 2016-01-04 DIAGNOSIS — I639 Cerebral infarction, unspecified: Secondary | ICD-10-CM

## 2016-01-04 DIAGNOSIS — D696 Thrombocytopenia, unspecified: Secondary | ICD-10-CM | POA: Diagnosis not present

## 2016-01-04 DIAGNOSIS — R5383 Other fatigue: Secondary | ICD-10-CM | POA: Diagnosis not present

## 2016-01-04 DIAGNOSIS — Z9889 Other specified postprocedural states: Secondary | ICD-10-CM | POA: Diagnosis not present

## 2016-01-04 DIAGNOSIS — D693 Immune thrombocytopenic purpura: Secondary | ICD-10-CM | POA: Insufficient documentation

## 2016-01-04 HISTORY — DX: Immune thrombocytopenic purpura: D69.3

## 2016-01-04 LAB — CBC WITH DIFFERENTIAL/PLATELET
BASO%: 0.3 % (ref 0.0–2.0)
Basophils Absolute: 0 10*3/uL (ref 0.0–0.1)
EOS%: 1.3 % (ref 0.0–7.0)
Eosinophils Absolute: 0.1 10*3/uL (ref 0.0–0.5)
HEMATOCRIT: 38 % (ref 34.8–46.6)
HGB: 12.7 g/dL (ref 11.6–15.9)
LYMPH#: 2.8 10*3/uL (ref 0.9–3.3)
LYMPH%: 41 % (ref 14.0–49.7)
MCH: 28.8 pg (ref 25.1–34.0)
MCHC: 33.4 g/dL (ref 31.5–36.0)
MCV: 86.2 fL (ref 79.5–101.0)
MONO#: 0.7 10*3/uL (ref 0.1–0.9)
MONO%: 10 % (ref 0.0–14.0)
NEUT%: 47.4 % (ref 38.4–76.8)
NEUTROS ABS: 3.2 10*3/uL (ref 1.5–6.5)
PLATELETS: 42 10*3/uL — AB (ref 145–400)
RBC: 4.41 10*6/uL (ref 3.70–5.45)
RDW: 13.8 % (ref 11.2–14.5)
WBC: 6.7 10*3/uL (ref 3.9–10.3)

## 2016-01-04 MED ORDER — PREDNISONE 20 MG PO TABS: 60.0000 mg | ORAL_TABLET | Freq: Every day | ORAL | Status: DC

## 2016-01-04 NOTE — Progress Notes (Signed)
[place] NOTE  Adrienne Naas, MD SUMMARY OF HEMATOLOGIC HISTORY:  This is a pleasant lady with chronic thrombocytopenia. The patient was discovered to have low platelet count when she was placed on Plavix for coronary artery disease. 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 She is being observed. INTERVAL HISTORY: [name] 82 y.o. female returns for further follow-up, accompanied by her husband. I received some of the outside records from her primary care physician's office. She has frequent blood work monitoring since October 2016 which showed progressive thrombocytopenia. On 12/07/2015, her platelet count has dropped to 48,000. She complained of excessive fatigue. The patient denies any recent signs or symptoms of bleeding such as spontaneous epistaxis, hematuria or hematochezia. She complained of chronic sinus allergy and is recently placed on Claritin She denies recent infection  I have reviewed the past medical history, past surgical history, social history and family history with the patient and they are unchanged from previous note.  ALLERGIES:  is allergic to bee venom; lyrica; codeine; demerol; ambien; aspirin; celebrex; darvocet; fosamax; lipitor; morphine and related; valium; and iodine.  MEDICATIONS:  Current Outpatient Prescriptions  Medication Sig Dispense Refill  . calcium citrate (CALCITRATE - DOSED IN MG ELEMENTAL CALCIUM) 950 MG tablet Take 1 tablet by mouth daily.    . Cholecalciferol (VITAMIN D-3 PO) Take 1 tablet by mouth daily.    Marland Kitchen EPINEPHrine (ADRENALIN) 1 MG/ML injection Inject 1 mg as directed as needed for anaphylaxis.     Marland Kitchen ezetimibe (ZETIA) 10 MG tablet Take 1 tablet (10 mg total) by mouth daily. 30 tablet 6  . fexofenadine (ALLEGRA) 180  MG tablet Take 90 mg by mouth daily.     Marland Kitchen Lysine 500 MG CAPS Take 1 capsule by mouth 2 (two) times daily as needed (fever blisters).     . meclizine (ANTIVERT) 25 MG tablet Take 25 mg by mouth daily as needed for dizziness.     . nitroGLYCERIN (NITROSTAT) 0.4 MG SL tablet Place 0.4 mg under the tongue every 5 (five) minutes as needed for chest pain.    . Omega 3 1000 MG CAPS Take 1 each by mouth 2 (two) times daily.    Marland Kitchen omeprazole (PRILOSEC) 40 MG capsule Take 40 mg by mouth daily.    . predniSONE (DELTASONE) 20 MG tablet Take 3 tablets (60 mg total) by mouth daily with breakfast. 90 tablet 1  . Prenatal Vit-Fe Fumarate-FA (PRENATAL VITAMIN PO) Take 1 tablet by mouth 2 (two) times daily.    . sotalol (BETAPACE) 160 MG tablet TAKE 1 TABLET BY MOUTH TWICE A DAY 60 tablet 8   No current facility-administered medications for this visit.     REVIEW OF SYSTEMS:   Constitutional: Denies fevers, chills or night sweats Eyes: Denies blurriness of vision Ears, nose, mouth, throat, and face: Denies mucositis or sore throat Respiratory: Denies cough, dyspnea or wheezes Cardiovascular: Denies palpitation, chest discomfort or lower extremity swelling Gastrointestinal:  Denies nausea, heartburn or change in bowel habits Skin: Denies abnormal skin rashes Lymphatics: Denies new lymphadenopathy or easy bruising Neurological:Denies numbness, tingling or new weaknesses Behavioral/Psych: Mood is stable, no new changes  All other systems were reviewed with the patient and are negative.  PHYSICAL EXAMINATION: ECOG PERFORMANCE STATUS: 0 - Asymptomatic  Filed Vitals:   01/04/16 1206  BP: 157/60  Pulse: 51  Temp: 97.4 F (36.3 C)  Resp: 18   Filed Weights   01/04/16 1206  Weight: 135 lb 4.8 oz (61.372 kg)    GENERAL:alert, no distress and comfortable SKIN: skin color, texture, turgor are normal, no rashes or significant lesions EYES: normal, Conjunctiva are pink and non-injected, sclera  clear OROPHARYNX:no exudate, no erythema and lips, buccal mucosa, and tongue normal  NECK: supple, thyroid normal size, non-tender, without nodularity LYMPH:  no palpable lymphadenopathy in the cervical, axillary or inguinal LUNGS: clear to auscultation and percussion with normal breathing effort HEART: regular rate & rhythm and no murmurs and no lower extremity edema ABDOMEN:abdomen soft, non-tender and normal bowel sounds Musculoskeletal:no cyanosis of digits and no clubbing  NEURO: alert & oriented x 3 with fluent speech, no focal motor/sensory deficits. She appears to have problems with memory  LABORATORY DATA:  I have reviewed the data as listed Results for orders placed or performed in visit on 01/04/16 (from the past 48 hour(s))  CBC with Differential/Platelet     Status: Abnormal   Collection Time: 01/04/16 11:52 AM  Result Value Ref Range   WBC 6.7 3.9 - 10.3 10e3/uL   NEUT# 3.2 1.5 - 6.5 10e3/uL   HGB 12.7 11.6 - 15.9 g/dL   HCT 38.0 34.8 - 46.6 %   Platelets 42 (L) 145 - 400 10e3/uL   MCV 86.2 79.5 - 101.0 fL   MCH 28.8 25.1 - 34.0 pg   MCHC 33.4 31.5 - 36.0 g/dL   RBC 4.41 3.70 - 5.45 10e6/uL   RDW 13.8 11.2 - 14.5 %   lymph# 2.8 0.9 - 3.3 10e3/uL   MONO# 0.7 0.1 - 0.9 10e3/uL   Eosinophils Absolute 0.1 0.0 - 0.5 10e3/uL   Basophils Absolute 0.0 0.0 - 0.1 10e3/uL   NEUT% 47.4 38.4 - 76.8 %   LYMPH% 41.0 14.0 - 49.7 %   MONO% 10.0 0.0 - 14.0 %   EOS% 1.3 0.0 - 7.0 %   BASO% 0.3 0.0 - 2.0 %    Lab Results  Component Value Date   WBC 6.7 01/04/2016   HGB 12.7 01/04/2016   HCT 38.0 01/04/2016   MCV 86.2 01/04/2016   PLT 42* 01/04/2016   ASSESSMENT & PLAN:  Thrombocytopenia Her last bone marrow biopsy in 2013 was consistent with consumption picture, likely ITP Her last 2 blood work showed worsening thrombocytopenia under 50,000. She remain asymptomatic However, she is at high risk of bleeding. Her examination is benign. I discussed with the patient and her  husband that I am comfortable treating her without repeating any further workup with a trial of prednisone therapy. The patient expressed concern about trying "new medications" because she is "allergic to almost everything". The alternative would be to use other agents such as Rituxan and IVIG which are associated with more side effects. After much discussion including the risks, benefit, side effects of treatment she is in agreement to try prednisone. I will start her on 60 mg daily. She will return here on a weekly basis for blood draw. The goal of treatment is to get platelet count over 50,000. I will see her back within a month for toxicity review. I will add vitamin B-12 level in her next visit  S/P mitral valve repair Examination is benign. Her last echocardiogram showed no significant valvular heart disease. I have order fibrinogen level today just to make sure it wasn't consumption related to valvular leak as a cause of her progressive thrombocytopenia  Other fatigue She has chronic fatigue of unknown etiology. I told the patient her hemoglobin is normal and that is not the cause of her fatigue. I will defer to her primary doctor for further management.  CVA (cerebral vascular accident) [facility]) She is on medical management with risk factor modification for history of stroke She appears to have very poor memory and I am concerned about possible early onset dementia. I will consider referral to neurologist after further evaluation in her next visit   All questions were answered. The patient knows to call the clinic with any problems, questions or concerns. No barriers to learning was detected.  I spent 20 minutes counseling the patient face to face. The total time spent in the appointment was 25 minutes and more than 50% was on counseling.     [facility], Jaegar Croft, MD 4/17/201712:45 PM

## 2016-01-04 NOTE — Assessment & Plan Note (Signed)
Examination is benign. Her last echocardiogram showed no significant valvular heart disease. I have order fibrinogen level today just to make sure it wasn't consumption related to valvular leak as a cause of her progressive thrombocytopenia

## 2016-01-04 NOTE — Telephone Encounter (Signed)
per pof to sch pt appt-gave pt copy of avs °

## 2016-01-04 NOTE — Assessment & Plan Note (Signed)
She is on medical management with risk factor modification for history of stroke She appears to have very poor memory and I am concerned about possible early onset dementia. I will consider referral to neurologist after further evaluation in her next visit

## 2016-01-04 NOTE — Patient Instructions (Signed)
Prednisone tablets What is this medicine? PREDNISONE (PRED Wilbert Hayashi sone) is a corticosteroid. It is commonly used to treat inflammation of the skin, joints, lungs, and other organs. Common conditions treated include asthma, allergies, and arthritis. It is also used for other conditions, such as blood disorders and diseases of the adrenal glands. This medicine may be used for other purposes; ask your health care provider or pharmacist if you have questions. What should I tell my health care provider before I take this medicine? They need to know if you have any of these conditions: -Cushing's syndrome -diabetes -glaucoma -heart disease -high blood pressure -infection (especially a virus infection such as chickenpox, cold sores, or herpes) -kidney disease -liver disease -mental illness -myasthenia gravis -osteoporosis -seizures -stomach or intestine problems -thyroid disease -an unusual or allergic reaction to lactose, prednisone, other medicines, foods, dyes, or preservatives -pregnant or trying to get pregnant -breast-feeding How should I use this medicine? Take this medicine by mouth with a glass of water. Follow the directions on the prescription label. Take this medicine with food. If you are taking this medicine once a day, take it in the morning. Do not take more medicine than you are told to take. Do not suddenly stop taking your medicine because you may develop a severe reaction. Your doctor will tell you how much medicine to take. If your doctor wants you to stop the medicine, the dose may be slowly lowered over time to avoid any side effects. Talk to your pediatrician regarding the use of this medicine in children. Special care may be needed. Overdosage: If you think you have taken too much of this medicine contact a poison control center or emergency room at once. NOTE: This medicine is only for you. Do not share this medicine with others. What if I miss a dose? If you miss a dose,  take it as soon as you can. If it is almost time for your next dose, talk to your doctor or health care professional. You may need to miss a dose or take an extra dose. Do not take double or extra doses without advice. What may interact with this medicine? Do not take this medicine with any of the following medications: -metyrapone -mifepristone This medicine may also interact with the following medications: -aminoglutethimide -amphotericin B -aspirin and aspirin-like medicines -barbiturates -certain medicines for diabetes, like glipizide or glyburide -cholestyramine -cholinesterase inhibitors -cyclosporine -digoxin -diuretics -ephedrine -female hormones, like estrogens and birth control pills -isoniazid -ketoconazole -NSAIDS, medicines for pain and inflammation, like ibuprofen or naproxen -phenytoin -rifampin -toxoids -vaccines -warfarin This list may not describe all possible interactions. Give your health care provider a list of all the medicines, herbs, non-prescription drugs, or dietary supplements you use. Also tell them if you smoke, drink alcohol, or use illegal drugs. Some items may interact with your medicine. What should I watch for while using this medicine? Visit your doctor or health care professional for regular checks on your progress. If you are taking this medicine over a prolonged period, carry an identification card with your name and address, the type and dose of your medicine, and your doctor's name and address. This medicine may increase your risk of getting an infection. Tell your doctor or health care professional if you are around anyone with measles or chickenpox, or if you develop sores or blisters that do not heal properly. If you are going to have surgery, tell your doctor or health care professional that you have taken this medicine within the last   twelve months. Ask your doctor or health care professional about your diet. You may need to lower the amount  of salt you eat. This medicine may affect blood sugar levels. If you have diabetes, check with your doctor or health care professional before you change your diet or the dose of your diabetic medicine. What side effects may I notice from receiving this medicine? Side effects that you should report to your doctor or health care professional as soon as possible: -allergic reactions like skin rash, itching or hives, swelling of the face, lips, or tongue -changes in emotions or moods -changes in vision -depressed mood -eye pain -fever or chills, cough, sore throat, pain or difficulty passing urine -increased thirst -swelling of ankles, feet Side effects that usually do not require medical attention (report to your doctor or health care professional if they continue or are bothersome): -confusion, excitement, restlessness -headache -nausea, vomiting -skin problems, acne, thin and shiny skin -trouble sleeping -weight gain This list may not describe all possible side effects. Call your doctor for medical advice about side effects. You may report side effects to FDA at 1-800-FDA-1088. Where should I keep my medicine? Keep out of the reach of children. Store at room temperature between 15 and 30 degrees C (59 and 86 degrees F). Protect from light. Keep container tightly closed. Throw away any unused medicine after the expiration date. NOTE: This sheet is a summary. It may not cover all possible information. If you have questions about this medicine, talk to your doctor, pharmacist, or health care provider.    2016, [employer]vier/[employer] Standard. (2011-04-21 10:57:14)  

## 2016-01-04 NOTE — Assessment & Plan Note (Signed)
She has chronic fatigue of unknown etiology. I told the patient her hemoglobin is normal and that is not the cause of her fatigue. I will defer to her primary doctor for further management. 

## 2016-01-04 NOTE — Assessment & Plan Note (Signed)
Her last bone marrow biopsy in 2013 was consistent with consumption picture, likely ITP Her last 2 blood work showed worsening thrombocytopenia under 50,000. She remain asymptomatic However, she is at high risk of bleeding. Her examination is benign. I discussed with the patient and her husband that I am comfortable treating her without repeating any further workup with a trial of prednisone therapy. The patient expressed concern about trying "new medications" because she is "allergic to almost everything". The alternative would be to use other agents such as Rituxan and IVIG which are associated with more side effects. After much discussion including the risks, benefit, side effects of treatment she is in agreement to try prednisone. I will start her on 60 mg daily. She will return here on a weekly basis for blood draw. The goal of treatment is to get platelet count over 50,000. I will see her back within a month for toxicity review. I will add vitamin B-12 level in her next visit

## 2016-01-05 LAB — FIBRINOGEN: FIBRINOGEN: 288 mg/dL (ref 193–507)

## 2016-01-11 ENCOUNTER — Other Ambulatory Visit (HOSPITAL_BASED_OUTPATIENT_CLINIC_OR_DEPARTMENT_OTHER): Payer: Commercial Managed Care - HMO

## 2016-01-11 ENCOUNTER — Telehealth: Payer: Self-pay | Admitting: *Deleted

## 2016-01-11 DIAGNOSIS — D696 Thrombocytopenia, unspecified: Secondary | ICD-10-CM | POA: Diagnosis not present

## 2016-01-11 DIAGNOSIS — D693 Immune thrombocytopenic purpura: Secondary | ICD-10-CM

## 2016-01-11 LAB — CBC WITH DIFFERENTIAL/PLATELET
BASO%: 0.1 % (ref 0.0–2.0)
Basophils Absolute: 0 10*3/uL (ref 0.0–0.1)
EOS%: 0.3 % (ref 0.0–7.0)
Eosinophils Absolute: 0 10*3/uL (ref 0.0–0.5)
HEMATOCRIT: 40.6 % (ref 34.8–46.6)
HEMOGLOBIN: 13.3 g/dL (ref 11.6–15.9)
LYMPH#: 3.6 10*3/uL — AB (ref 0.9–3.3)
LYMPH%: 31.1 % (ref 14.0–49.7)
MCH: 28.3 pg (ref 25.1–34.0)
MCHC: 32.6 g/dL (ref 31.5–36.0)
MCV: 86.8 fL (ref 79.5–101.0)
MONO#: 1 10*3/uL — ABNORMAL HIGH (ref 0.1–0.9)
MONO%: 9.1 % (ref 0.0–14.0)
NEUT%: 59.4 % (ref 38.4–76.8)
NEUTROS ABS: 6.8 10*3/uL — AB (ref 1.5–6.5)
Platelets: 189 10*3/uL (ref 145–400)
RBC: 4.68 10*6/uL (ref 3.70–5.45)
RDW: 14 % (ref 11.2–14.5)
WBC: 11.5 10*3/uL — AB (ref 3.9–10.3)

## 2016-01-11 NOTE — Telephone Encounter (Signed)
Platelet count is wnl at 189 today.   Dr. Alvy Bimler instructs for pt to decrease Prednisone to 40 mg daily.  LVM for pt to return nurse's call to confirm instructions.

## 2016-01-11 NOTE — Telephone Encounter (Signed)
Pt returned call and confirmed new instructions to decrease Prednisone to 40 mg a day.

## 2016-01-12 LAB — VITAMIN B12: VITAMIN B 12: 800 pg/mL (ref 211–946)

## 2016-01-18 ENCOUNTER — Other Ambulatory Visit (HOSPITAL_BASED_OUTPATIENT_CLINIC_OR_DEPARTMENT_OTHER): Payer: Commercial Managed Care - HMO

## 2016-01-18 ENCOUNTER — Telehealth: Payer: Self-pay | Admitting: *Deleted

## 2016-01-18 DIAGNOSIS — D693 Immune thrombocytopenic purpura: Secondary | ICD-10-CM

## 2016-01-18 DIAGNOSIS — D696 Thrombocytopenia, unspecified: Secondary | ICD-10-CM

## 2016-01-18 LAB — CBC WITH DIFFERENTIAL/PLATELET
BASO%: 0.3 % (ref 0.0–2.0)
Basophils Absolute: 0 10*3/uL (ref 0.0–0.1)
EOS%: 0.2 % (ref 0.0–7.0)
Eosinophils Absolute: 0 10*3/uL (ref 0.0–0.5)
HCT: 39 % (ref 34.8–46.6)
HEMOGLOBIN: 12.7 g/dL (ref 11.6–15.9)
LYMPH%: 26.4 % (ref 14.0–49.7)
MCH: 28.4 pg (ref 25.1–34.0)
MCHC: 32.6 g/dL (ref 31.5–36.0)
MCV: 86.8 fL (ref 79.5–101.0)
MONO#: 0.9 10*3/uL (ref 0.1–0.9)
MONO%: 6.5 % (ref 0.0–14.0)
NEUT%: 66.6 % (ref 38.4–76.8)
NEUTROS ABS: 8.8 10*3/uL — AB (ref 1.5–6.5)
Platelets: 130 10*3/uL — ABNORMAL LOW (ref 145–400)
RBC: 4.49 10*6/uL (ref 3.70–5.45)
RDW: 14.7 % — AB (ref 11.2–14.5)
WBC: 13.2 10*3/uL — AB (ref 3.9–10.3)
lymph#: 3.5 10*3/uL — ABNORMAL HIGH (ref 0.9–3.3)

## 2016-01-18 NOTE — Telephone Encounter (Signed)
-----   Message from Heath Lark, MD sent at 01/18/2016  8:55 AM EDT ----- Regarding: CBC Platelet a little lower today but still acceptable Reduce prednisone to 30 mg daily (1 and a half tablet) ----- Message -----    From: [facility]    Sent: 01/18/2016   8:50 AM      To: Heath Lark, MD

## 2016-01-18 NOTE — Telephone Encounter (Signed)
Pt notified of message below. Verbalized understanding 

## 2016-01-25 ENCOUNTER — Other Ambulatory Visit (HOSPITAL_BASED_OUTPATIENT_CLINIC_OR_DEPARTMENT_OTHER): Payer: Commercial Managed Care - HMO

## 2016-01-25 ENCOUNTER — Telehealth: Payer: Self-pay | Admitting: *Deleted

## 2016-01-25 DIAGNOSIS — D696 Thrombocytopenia, unspecified: Secondary | ICD-10-CM

## 2016-01-25 DIAGNOSIS — D693 Immune thrombocytopenic purpura: Secondary | ICD-10-CM

## 2016-01-25 LAB — CBC WITH DIFFERENTIAL/PLATELET
BASO%: 0.5 % (ref 0.0–2.0)
BASOS ABS: 0.1 10*3/uL (ref 0.0–0.1)
EOS ABS: 0 10*3/uL (ref 0.0–0.5)
EOS%: 0.4 % (ref 0.0–7.0)
HCT: 41.6 % (ref 34.8–46.6)
HEMOGLOBIN: 13.4 g/dL (ref 11.6–15.9)
LYMPH%: 31.9 % (ref 14.0–49.7)
MCH: 28.5 pg (ref 25.1–34.0)
MCHC: 32.1 g/dL (ref 31.5–36.0)
MCV: 88.7 fL (ref 79.5–101.0)
MONO#: 1 10*3/uL — ABNORMAL HIGH (ref 0.1–0.9)
MONO%: 9.5 % (ref 0.0–14.0)
NEUT#: 6.3 10*3/uL (ref 1.5–6.5)
NEUT%: 57.7 % (ref 38.4–76.8)
Platelets: 94 10*3/uL — ABNORMAL LOW (ref 145–400)
RBC: 4.69 10*6/uL (ref 3.70–5.45)
RDW: 15 % — AB (ref 11.2–14.5)
WBC: 11 10*3/uL — ABNORMAL HIGH (ref 3.9–10.3)
lymph#: 3.5 10*3/uL — ABNORMAL HIGH (ref 0.9–3.3)

## 2016-01-25 NOTE — Telephone Encounter (Signed)
-----   Message from Heath Lark, MD sent at 01/25/2016  9:20 AM EDT ----- Regarding: CBC Platelet a little lower today but still acceptable Continue on prednisone 30 mg daily (1 and a half tablet) ----- Message -----    From: [facility]    Sent: 01/25/2016   8:56 AM      To: Heath Lark, MD

## 2016-01-25 NOTE — Telephone Encounter (Signed)
Informed pt of lab results and Dr. Calton Dach instructions below.  Keep Lab/MD appt next week as scheduled.  She verbalized understanding.

## 2016-02-01 ENCOUNTER — Telehealth: Payer: Self-pay | Admitting: Hematology and Oncology

## 2016-02-01 ENCOUNTER — Ambulatory Visit (HOSPITAL_BASED_OUTPATIENT_CLINIC_OR_DEPARTMENT_OTHER): Payer: Commercial Managed Care - HMO | Admitting: Hematology and Oncology

## 2016-02-01 ENCOUNTER — Encounter: Payer: Self-pay | Admitting: Hematology and Oncology

## 2016-02-01 ENCOUNTER — Other Ambulatory Visit (HOSPITAL_BASED_OUTPATIENT_CLINIC_OR_DEPARTMENT_OTHER): Payer: Commercial Managed Care - HMO

## 2016-02-01 VITALS — BP 139/62 | HR 51 | Temp 97.6°F | Resp 18 | Ht 61.0 in | Wt 135.6 lb

## 2016-02-01 DIAGNOSIS — D696 Thrombocytopenia, unspecified: Secondary | ICD-10-CM | POA: Diagnosis not present

## 2016-02-01 DIAGNOSIS — I69811 Memory deficit following other cerebrovascular disease: Secondary | ICD-10-CM

## 2016-02-01 DIAGNOSIS — D693 Immune thrombocytopenic purpura: Secondary | ICD-10-CM

## 2016-02-01 DIAGNOSIS — H8149 Vertigo of central origin, unspecified ear: Secondary | ICD-10-CM | POA: Diagnosis not present

## 2016-02-01 LAB — CBC WITH DIFFERENTIAL/PLATELET
BASO%: 0.1 % (ref 0.0–2.0)
Basophils Absolute: 0 10*3/uL (ref 0.0–0.1)
EOS%: 0.2 % (ref 0.0–7.0)
Eosinophils Absolute: 0 10*3/uL (ref 0.0–0.5)
HEMATOCRIT: 39.4 % (ref 34.8–46.6)
HGB: 13 g/dL (ref 11.6–15.9)
LYMPH#: 1.9 10*3/uL (ref 0.9–3.3)
LYMPH%: 19.6 % (ref 14.0–49.7)
MCH: 29.1 pg (ref 25.1–34.0)
MCHC: 33 g/dL (ref 31.5–36.0)
MCV: 88.3 fL (ref 79.5–101.0)
MONO#: 0.6 10*3/uL (ref 0.1–0.9)
MONO%: 5.9 % (ref 0.0–14.0)
NEUT%: 74.2 % (ref 38.4–76.8)
NEUTROS ABS: 7.1 10*3/uL — AB (ref 1.5–6.5)
PLATELETS: 98 10*3/uL — AB (ref 145–400)
RBC: 4.46 10*6/uL (ref 3.70–5.45)
RDW: 15 % — ABNORMAL HIGH (ref 11.2–14.5)
WBC: 9.6 10*3/uL (ref 3.9–10.3)

## 2016-02-01 NOTE — Progress Notes (Signed)
[place] NOTE  Adrienne Naas, MD SUMMARY OF HEMATOLOGIC HISTORY:  This is a pleasant lady with chronic thrombocytopenia. The patient was discovered to have low platelet count when she was placed on Plavix for coronary artery disease. 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 She is being observed. On 01/04/2016, she was started on prednisone therapy for ITP when her platelet count dip under  50,000 INTERVAL HISTORY: [name] 82 y.o. female returns for further follow-up. She tolerated prednisone poorly.  She complained of complete memory loss, fatigue, feeling unwell and vertigo  Of note, the patient was already prescribed meclizine recently She cannot elaborate further in terms of her memory loss but felt that she could not perform most of her activities of daily living and forgot how to perform her activities of daily living, which she correlated with the start date of prednisone She denies insomnia The patient denies any recent signs or symptoms of bleeding such as spontaneous epistaxis, hematuria or hematochezia.   I have reviewed the past medical history, past surgical history, social history and family history with the patient and they are unchanged from previous note.  ALLERGIES:  is allergic to bee venom; lyrica; codeine; demerol; ambien; aspirin; celebrex; darvocet; fosamax; lipitor; morphine and related; valium; and iodine.  MEDICATIONS:  Current Outpatient Prescriptions  Medication Sig Dispense Refill  . calcium citrate (CALCITRATE - DOSED IN MG ELEMENTAL CALCIUM) 950 MG tablet Take 1 tablet by mouth daily.    . cetirizine (ZYRTEC) 10 MG tablet Take 10 mg by mouth daily.    . Cholecalciferol (VITAMIN D-3 PO) Take 1 tablet by mouth daily.    Marland Kitchen ezetimibe  (ZETIA) 10 MG tablet Take 1 tablet (10 mg total) by mouth daily. 30 tablet 6  . Lysine 500 MG CAPS Take 1 capsule by mouth 2 (two) times daily as needed (fever blisters).     . meclizine (ANTIVERT) 25 MG tablet Take 25 mg by mouth daily as needed for dizziness.     . Omega 3 1000 MG CAPS Take 1 each by mouth 2 (two) times daily.    Marland Kitchen omeprazole (PRILOSEC) 40 MG capsule Take 40 mg by mouth daily.    . predniSONE (DELTASONE) 20 MG tablet Take 3 tablets (60 mg total) by mouth daily with breakfast. 90 tablet 1  . Prenatal Vit-Fe Fumarate-FA (PRENATAL VITAMIN PO) Take 1 tablet by mouth 2 (two) times daily.    . sotalol (BETAPACE) 160 MG tablet TAKE 1 TABLET BY MOUTH TWICE A DAY 60 tablet 8  . EPINEPHrine (ADRENALIN) 1 MG/ML injection Inject 1 mg as directed as needed for anaphylaxis. Reported on 02/01/2016    . nitroGLYCERIN (NITROSTAT) 0.4 MG SL tablet Place 0.4 mg under the tongue every 5 (five) minutes as needed for chest pain. Reported on 02/01/2016     No current facility-administered medications for this visit.     REVIEW OF SYSTEMS:   Constitutional: Denies fevers, chills or night sweats Eyes: Denies blurriness of vision Ears, nose, mouth, throat, and face: Denies mucositis or sore throat Respiratory: Denies cough, dyspnea or wheezes Cardiovascular: Denies palpitation, chest discomfort or lower extremity swelling Gastrointestinal:  Denies nausea, heartburn or change in bowel habits Skin: Denies abnormal skin rashes Lymphatics: Denies new lymphadenopathy or easy bruising Neurological:Denies numbness, tingling or new weaknesses Behavioral/Psych: Mood is stable,  no new changes  All other systems were reviewed with the patient and are negative.  PHYSICAL EXAMINATION: ECOG PERFORMANCE STATUS: 1 - Symptomatic but completely ambulatory  Filed Vitals:   02/01/16 1012  BP: 139/62  Pulse: 51  Temp: 97.6 F (36.4 C)  Resp: 18   Filed Weights   02/01/16 1012  Weight: 135 lb 9.6 oz (61.508  kg)    GENERAL:alert, no distress and comfortable SKIN: skin color, texture, turgor are normal, no rashes or significant lesions EYES: normal, Conjunctiva are pink and non-injected, sclera clear Musculoskeletal:no cyanosis of digits and no clubbing  NEURO: alert & oriented x 3 with fluent speech, no focal motor/sensory deficits  LABORATORY DATA:  I have reviewed the data as listed     Component Value Date/Time   NA 139 09/02/2014 1409   NA 134* 07/22/2009 0938   K 4.4 09/02/2014 1409   K 4.0 07/22/2009 0938   CL 104 08/02/2012 1515   CL 102 07/22/2009 0938   CO2 27 09/02/2014 1409   CO2 25 07/22/2009 0938   GLUCOSE 105 09/02/2014 1409   GLUCOSE 105* 08/02/2012 1515   GLUCOSE 184* 07/22/2009 0938   BUN 18.7 09/02/2014 1409   BUN 9 07/22/2009 0938   CREATININE 0.9 09/02/2014 1409   CREATININE 0.78 07/22/2009 0938   CALCIUM 9.2 09/02/2014 1409   CALCIUM 8.7 07/22/2009 0938   PROT 6.8 09/02/2014 1409   PROT 7.1 07/21/2009 2353   ALBUMIN 3.9 09/02/2014 1409   ALBUMIN 2.9* 07/21/2009 2353   AST 21 09/02/2014 1409   AST 18 07/21/2009 2353   ALT 21 09/02/2014 1409   ALT 18 07/21/2009 2353   ALKPHOS 52 09/02/2014 1409   ALKPHOS 101 07/21/2009 2353   BILITOT 0.28 09/02/2014 1409   BILITOT 0.6 07/21/2009 2353   GFRNONAA >60 07/22/2009 0938   GFRAA  07/22/2009 0938    >60        The eGFR has been calculated using the MDRD equation. This calculation has not been validated in all clinical situations. eGFR's persistently <60 mL/min signify possible Chronic Kidney Disease.    No results found for: SPEP, UPEP  Lab Results  Component Value Date   WBC 9.6 02/01/2016   NEUTROABS 7.1* 02/01/2016   HGB 13.0 02/01/2016   HCT 39.4 02/01/2016   MCV 88.3 02/01/2016   PLT 98* 02/01/2016      Chemistry      Component Value Date/Time   NA 139 09/02/2014 1409   NA 134* 07/22/2009 0938   K 4.4 09/02/2014 1409   K 4.0 07/22/2009 0938   CL 104 08/02/2012 1515   CL 102  07/22/2009 0938   CO2 27 09/02/2014 1409   CO2 25 07/22/2009 0938   BUN 18.7 09/02/2014 1409   BUN 9 07/22/2009 0938   CREATININE 0.9 09/02/2014 1409   CREATININE 0.78 07/22/2009 0938      Component Value Date/Time   CALCIUM 9.2 09/02/2014 1409   CALCIUM 8.7 07/22/2009 0938   ALKPHOS 52 09/02/2014 1409   ALKPHOS 101 07/21/2009 2353   AST 21 09/02/2014 1409   AST 18 07/21/2009 2353   ALT 21 09/02/2014 1409   ALT 18 07/21/2009 2353   BILITOT 0.28 09/02/2014 1409   BILITOT 0.6 07/21/2009 2353      ASSESSMENT & PLAN:  Thrombocytopenia She tolerated prednisone poorly and blame it on everything including vertigo, feeling unwell and complete memory loss. When I question her memory loss, she stated that she could not perform  any activities that she usually do at home. In my experience, prednisone does not cause the symptoms that she is experiencing. I do not feel strongly to change her to other medications. If she cannot tolerate prednisone, the chances of her tolerating other medications would be even lower and more difficult. I recommend slow taper of prednisone to 20 mg starting tomorrow. Starting 02/09/2016, she will reduce it to 10 mg daily until I see her back. The goal is to bring the platelet count greater than 50,000 only.   All questions were answered. The patient knows to call the clinic with any problems, questions or concerns. No barriers to learning was detected.  I spent 15 minutes counseling the patient face to face. The total time spent in the appointment was 20 minutes and more than 50% was on counseling.     [facility], Adrienne Karrer, MD 5/15/201712:24 PM

## 2016-02-01 NOTE — Telephone Encounter (Signed)
Gave and printed appt sched and avs for pt for May and June  °

## 2016-02-01 NOTE — Assessment & Plan Note (Signed)
She tolerated prednisone poorly and blame it on everything including vertigo, feeling unwell and complete memory loss. When I question her memory loss, she stated that she could not perform any activities that she usually do at home. In my experience, prednisone does not cause the symptoms that she is experiencing. I do not feel strongly to change her to other medications. If she cannot tolerate prednisone, the chances of her tolerating other medications would be even lower and more difficult. I recommend slow taper of prednisone to 20 mg starting tomorrow. Starting 02/09/2016, she will reduce it to 10 mg daily until I see her back. The goal is to bring the platelet count greater than 50,000 only.

## 2016-02-05 ENCOUNTER — Telehealth: Payer: Self-pay | Admitting: Hematology and Oncology

## 2016-02-05 ENCOUNTER — Encounter: Payer: Self-pay | Admitting: *Deleted

## 2016-02-05 NOTE — Telephone Encounter (Signed)
returned call no vm...md stated that 5.22 appt cx per MD..Marland Kitchen

## 2016-02-08 ENCOUNTER — Other Ambulatory Visit: Payer: Commercial Managed Care - HMO

## 2016-02-19 ENCOUNTER — Encounter: Payer: Self-pay | Admitting: Hematology and Oncology

## 2016-02-19 ENCOUNTER — Ambulatory Visit (HOSPITAL_BASED_OUTPATIENT_CLINIC_OR_DEPARTMENT_OTHER): Payer: Commercial Managed Care - HMO | Admitting: Hematology and Oncology

## 2016-02-19 ENCOUNTER — Other Ambulatory Visit (HOSPITAL_BASED_OUTPATIENT_CLINIC_OR_DEPARTMENT_OTHER): Payer: Commercial Managed Care - HMO

## 2016-02-19 VITALS — BP 136/48 | HR 55 | Temp 97.7°F | Resp 18 | Ht 61.0 in | Wt 135.4 lb

## 2016-02-19 DIAGNOSIS — D696 Thrombocytopenia, unspecified: Secondary | ICD-10-CM

## 2016-02-19 DIAGNOSIS — D693 Immune thrombocytopenic purpura: Secondary | ICD-10-CM

## 2016-02-19 LAB — CBC WITH DIFFERENTIAL/PLATELET
BASO%: 0.1 % (ref 0.0–2.0)
Basophils Absolute: 0 10*3/uL (ref 0.0–0.1)
EOS%: 0.7 % (ref 0.0–7.0)
Eosinophils Absolute: 0.1 10*3/uL (ref 0.0–0.5)
HCT: 39.3 % (ref 34.8–46.6)
HGB: 13.2 g/dL (ref 11.6–15.9)
LYMPH%: 27.8 % (ref 14.0–49.7)
MCH: 29.7 pg (ref 25.1–34.0)
MCHC: 33.6 g/dL (ref 31.5–36.0)
MCV: 88.3 fL (ref 79.5–101.0)
MONO#: 0.8 10*3/uL (ref 0.1–0.9)
MONO%: 10.7 % (ref 0.0–14.0)
NEUT#: 4.6 10*3/uL (ref 1.5–6.5)
NEUT%: 60.7 % (ref 38.4–76.8)
PLATELETS: 60 10*3/uL — AB (ref 145–400)
RBC: 4.45 10*6/uL (ref 3.70–5.45)
RDW: 14.7 % — ABNORMAL HIGH (ref 11.2–14.5)
WBC: 7.6 10*3/uL (ref 3.9–10.3)
lymph#: 2.1 10*3/uL (ref 0.9–3.3)

## 2016-02-19 NOTE — Progress Notes (Signed)
[place] OFFICE PROGRESS NOTE  Patient Care Team: [name], MD as PCP - General (Family Medicine) [name], MD as Attending Physician (Family Medicine) Belva Crome, MD as Attending Physician (Cardiology) Garlan Fair, MD (Gastroenterology)  SUMMARY OF HEMATOLOGIC HISTORY:  This is a pleasant lady with chronic thrombocytopenia. The patient was discovered to have low platelet count when she was placed on Plavix for coronary artery disease. 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 She is being observed. On 01/04/2016, she was started on prednisone therapy for ITP when her platelet count dip under  50,000 May 2017, she tolerated prednisone poorly and prednisone is slowly taper off  INTERVAL HISTORY: Please see below for problem oriented charting. She is tearful. She still feels "bad" on prednisone. The patient denies any recent signs or symptoms of bleeding such as spontaneous epistaxis, hematuria or hematochezia.   REVIEW OF SYSTEMS:   Constitutional: Denies fevers, chills or abnormal weight loss Eyes: Denies blurriness of vision Ears, nose, mouth, throat, and face: Denies mucositis or sore throat Respiratory: Denies cough, dyspnea or wheezes Cardiovascular: Denies palpitation, chest discomfort or lower extremity swelling Gastrointestinal:  Denies nausea, heartburn or change in bowel habits Skin: Denies abnormal skin rashes Lymphatics: Denies new lymphadenopathy or easy bruising Neurological:Denies numbness, tingling or new weaknesses Behavioral/Psych: Mood is stable, no new changes  All other systems were reviewed with the patient and are negative.  I have reviewed the past medical history, past surgical history, social history and family history with the patient and they are  unchanged from previous note.  ALLERGIES:  is allergic to bee venom; lyrica; codeine; demerol; ambien; aspirin; celebrex; darvocet; fosamax; lipitor; morphine and related; valium; and iodine.  MEDICATIONS:  Current Outpatient Prescriptions  Medication Sig Dispense Refill  . calcium citrate (CALCITRATE - DOSED IN MG ELEMENTAL CALCIUM) 950 MG tablet Take 1 tablet by mouth daily.    . cetirizine (ZYRTEC) 10 MG tablet Take 10 mg by mouth daily.    . Cholecalciferol (VITAMIN D-3 PO) Take 1 tablet by mouth daily.    Marland Kitchen EPINEPHrine (ADRENALIN) 1 MG/ML injection Inject 1 mg as directed as needed for anaphylaxis. Reported on 02/01/2016    . ezetimibe (ZETIA) 10 MG tablet Take 1 tablet (10 mg total) by mouth daily. 30 tablet 6  . Lysine 500 MG CAPS Take 1 capsule by mouth 2 (two) times daily as needed (fever blisters).     . meclizine (ANTIVERT) 25 MG tablet Take 25 mg by mouth daily as needed for dizziness.     . nitroGLYCERIN (NITROSTAT) 0.4 MG SL tablet Place 0.4 mg under the tongue every 5 (five) minutes as needed for chest pain. Reported on 02/01/2016    . Omega 3 1000 MG CAPS Take 1 each by mouth 2 (two) times daily.    Marland Kitchen omeprazole (PRILOSEC) 40 MG capsule Take 40 mg by mouth daily.    . predniSONE (DELTASONE) 20 MG tablet Take 3 tablets (60 mg total) by mouth daily with breakfast. 90 tablet 1  . Prenatal Vit-Fe Fumarate-FA (PRENATAL VITAMIN PO) Take 1 tablet by mouth 2 (two) times daily.    . sotalol (BETAPACE) 160 MG tablet TAKE 1 TABLET BY MOUTH TWICE A DAY 60 tablet 8   No current facility-administered medications for this visit.    PHYSICAL EXAMINATION: ECOG PERFORMANCE STATUS: 1 -  Symptomatic but completely ambulatory  Filed Vitals:   02/19/16 0913  BP: 136/48  Pulse: 55  Temp: 97.7 F (36.5 C)  Resp: 18   Filed Weights   02/19/16 0913  Weight: 135 lb 6.4 oz (61.417 kg)    GENERAL:alert, no distress and comfortable SKIN: skin color, texture, turgor are normal, no rashes or  significant lesions EYES: normal, Conjunctiva are pink and non-injected, sclera clear Musculoskeletal:no cyanosis of digits and no clubbing  NEURO: alert & oriented x 3 with fluent speech, no focal motor/sensory deficits  LABORATORY DATA:  I have reviewed the data as listed    Component Value Date/Time   NA 139 09/02/2014 1409   NA 134* 07/22/2009 0938   K 4.4 09/02/2014 1409   K 4.0 07/22/2009 0938   CL 104 08/02/2012 1515   CL 102 07/22/2009 0938   CO2 27 09/02/2014 1409   CO2 25 07/22/2009 0938   GLUCOSE 105 09/02/2014 1409   GLUCOSE 105* 08/02/2012 1515   GLUCOSE 184* 07/22/2009 0938   BUN 18.7 09/02/2014 1409   BUN 9 07/22/2009 0938   CREATININE 0.9 09/02/2014 1409   CREATININE 0.78 07/22/2009 0938   CALCIUM 9.2 09/02/2014 1409   CALCIUM 8.7 07/22/2009 0938   PROT 6.8 09/02/2014 1409   PROT 7.1 07/21/2009 2353   ALBUMIN 3.9 09/02/2014 1409   ALBUMIN 2.9* 07/21/2009 2353   AST 21 09/02/2014 1409   AST 18 07/21/2009 2353   ALT 21 09/02/2014 1409   ALT 18 07/21/2009 2353   ALKPHOS 52 09/02/2014 1409   ALKPHOS 101 07/21/2009 2353   BILITOT 0.28 09/02/2014 1409   BILITOT 0.6 07/21/2009 2353   GFRNONAA >60 07/22/2009 0938   GFRAA  07/22/2009 0938    >60        The eGFR has been calculated using the MDRD equation. This calculation has not been validated in all clinical situations. eGFR's persistently <60 mL/min signify possible Chronic Kidney Disease.    No results found for: SPEP, UPEP  Lab Results  Component Value Date   WBC 7.6 02/19/2016   NEUTROABS 4.6 02/19/2016   HGB 13.2 02/19/2016   HCT 39.3 02/19/2016   MCV 88.3 02/19/2016   PLT 60* 02/19/2016      Chemistry      Component Value Date/Time   NA 139 09/02/2014 1409   NA 134* 07/22/2009 0938   K 4.4 09/02/2014 1409   K 4.0 07/22/2009 0938   CL 104 08/02/2012 1515   CL 102 07/22/2009 0938   CO2 27 09/02/2014 1409   CO2 25 07/22/2009 0938   BUN 18.7 09/02/2014 1409   BUN 9 07/22/2009 0938    CREATININE 0.9 09/02/2014 1409   CREATININE 0.78 07/22/2009 0938      Component Value Date/Time   CALCIUM 9.2 09/02/2014 1409   CALCIUM 8.7 07/22/2009 0938   ALKPHOS 52 09/02/2014 1409   ALKPHOS 101 07/21/2009 2353   AST 21 09/02/2014 1409   AST 18 07/21/2009 2353   ALT 21 09/02/2014 1409   ALT 18 07/21/2009 2353   BILITOT 0.28 09/02/2014 1409   BILITOT 0.6 07/21/2009 2353      ASSESSMENT & PLAN:   Thrombocytopenia She tolerated prednisone poorly and blame it on everything including vertigo, feeling unwell and complete memory loss. When I question her memory loss, she stated that she could not perform any activities that she usually do at home. In my experience, prednisone does not cause the symptoms that she is experiencing. With reduction  of prednisone dose, her platelets started to decline. The patient is very distressed and tearful. Alternative options would include rituximab, Promacta or Nplate but before I would prescribe those medications, she would likely need CT imaging to exclude lymphoma and bone marrow biopsy. I gave her and her husband multiple patient education handouts related to the medications side effects, etc. They will call me once they have made their decision. Alternatively, we can let her platelet count drift lower. Previously, she was asymptomatic despite low platelet count. I will start weaning her off prednisone every other day and then stop by next week. She has appointment to see me back in the near future.     All questions were answered. The patient knows to call the clinic with any problems, questions or concerns. No barriers to learning was detected. I spent 15 minutes counseling the patient face to face. The total time spent in the appointment was 20 minutes and more than 50% was on counseling and review of test results     [facility], [place], MD 02/19/2016 5:05 PM

## 2016-02-19 NOTE — Assessment & Plan Note (Signed)
She tolerated prednisone poorly and blame it on everything including vertigo, feeling unwell and complete memory loss. When I question her memory loss, she stated that she could not perform any activities that she usually do at home. In my experience, prednisone does not cause the symptoms that she is experiencing. With reduction of prednisone dose, her platelets started to decline. The patient is very distressed and tearful. Alternative options would include rituximab, Promacta or Nplate but before I would prescribe those medications, she would likely need CT imaging to exclude lymphoma and bone marrow biopsy. I gave her and her husband multiple patient education handouts related to the medications side effects, etc. They will call me once they have made their decision. Alternatively, we can let her platelet count drift lower. Previously, she was asymptomatic despite low platelet count. I will start weaning her off prednisone every other day and then stop by next week. She has appointment to see me back in the near future.

## 2016-05-02 ENCOUNTER — Ambulatory Visit (HOSPITAL_BASED_OUTPATIENT_CLINIC_OR_DEPARTMENT_OTHER): Payer: Commercial Managed Care - HMO | Admitting: Hematology and Oncology

## 2016-05-02 ENCOUNTER — Other Ambulatory Visit (HOSPITAL_BASED_OUTPATIENT_CLINIC_OR_DEPARTMENT_OTHER): Payer: Commercial Managed Care - HMO

## 2016-05-02 ENCOUNTER — Encounter: Payer: Self-pay | Admitting: Hematology and Oncology

## 2016-05-02 DIAGNOSIS — D696 Thrombocytopenia, unspecified: Secondary | ICD-10-CM

## 2016-05-02 DIAGNOSIS — D693 Immune thrombocytopenic purpura: Secondary | ICD-10-CM

## 2016-05-02 LAB — CBC WITH DIFFERENTIAL/PLATELET
BASO%: 0.7 % (ref 0.0–2.0)
BASOS ABS: 0 10*3/uL (ref 0.0–0.1)
EOS%: 1.5 % (ref 0.0–7.0)
Eosinophils Absolute: 0.1 10*3/uL (ref 0.0–0.5)
HEMATOCRIT: 41.5 % (ref 34.8–46.6)
HGB: 13.5 g/dL (ref 11.6–15.9)
LYMPH#: 2.4 10*3/uL (ref 0.9–3.3)
LYMPH%: 42 % (ref 14.0–49.7)
MCH: 28.8 pg (ref 25.1–34.0)
MCHC: 32.6 g/dL (ref 31.5–36.0)
MCV: 88.3 fL (ref 79.5–101.0)
MONO#: 0.6 10*3/uL (ref 0.1–0.9)
MONO%: 10.8 % (ref 0.0–14.0)
NEUT#: 2.6 10*3/uL (ref 1.5–6.5)
NEUT%: 45 % (ref 38.4–76.8)
PLATELETS: 82 10*3/uL — AB (ref 145–400)
RBC: 4.7 10*6/uL (ref 3.70–5.45)
RDW: 13.7 % (ref 11.2–14.5)
WBC: 5.8 10*3/uL (ref 3.9–10.3)

## 2016-05-02 NOTE — Progress Notes (Signed)
[place] NOTE  Adrienne Naas, MD SUMMARY OF HEMATOLOGIC HISTORY:  This is a pleasant lady with chronic thrombocytopenia. The patient was discovered to have low platelet count when she was placed on Plavix for coronary artery disease. 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 She is being observed. On 01/04/2016, she was started on prednisone therapy for ITP when her platelet count dip under  50,000 May 2017, she tolerated prednisone poorly and prednisone is slowly taper off  INTERVAL HISTORY: [name] 82 y.o. female returns for follow-up. She feels well since she is off prednisone. The patient denies any recent signs or symptoms of bleeding such as spontaneous epistaxis, hematuria or hematochezia. She denies bruising  I have reviewed the past medical history, past surgical history, social history and family history with the patient and they are unchanged from previous note.  ALLERGIES:  is allergic to bee venom; lyrica [pregabalin]; codeine; demerol [meperidine]; ambien [zolpidem tartrate]; aspirin; celebrex [celecoxib]; darvocet [propoxyphene n-acetaminophen]; fosamax [alendronate sodium]; lipitor [atorvastatin]; morphine and related; valium [diazepam]; and iodine.  MEDICATIONS:  Current Outpatient Prescriptions  Medication Sig Dispense Refill  . calcium citrate (CALCITRATE - DOSED IN MG ELEMENTAL CALCIUM) 950 MG tablet Take 1 tablet by mouth daily.    . cetirizine (ZYRTEC) 10 MG tablet Take 10 mg by mouth daily.    . Cholecalciferol (VITAMIN D-3 PO) Take 1 tablet by mouth daily.    Marland Kitchen EPINEPHrine (ADRENALIN) 1 MG/ML injection Inject 1 mg as directed as needed for anaphylaxis. Reported on 02/01/2016    . ezetimibe (ZETIA) 10 MG tablet Take 1 tablet (10 mg total) by  mouth daily. 30 tablet 6  . Lysine 500 MG CAPS Take 1 capsule by mouth 2 (two) times daily as needed (fever blisters).     . meclizine (ANTIVERT) 25 MG tablet Take 25 mg by mouth daily as needed for dizziness.     . nitroGLYCERIN (NITROSTAT) 0.4 MG SL tablet Place 0.4 mg under the tongue every 5 (five) minutes as needed for chest pain. Reported on 02/01/2016    . Omega 3 1000 MG CAPS Take 1 each by mouth 2 (two) times daily.    Marland Kitchen omeprazole (PRILOSEC) 40 MG capsule Take 40 mg by mouth daily.    . Prenatal Vit-Fe Fumarate-FA (PRENATAL VITAMIN PO) Take 1 tablet by mouth 2 (two) times daily.    . sotalol (BETAPACE) 160 MG tablet TAKE 1 TABLET BY MOUTH TWICE A DAY 60 tablet 8   No current facility-administered medications for this visit.      REVIEW OF SYSTEMS:   Constitutional: Denies fevers, chills or night sweats Eyes: Denies blurriness of vision Ears, nose, mouth, throat, and face: Denies mucositis or sore throat Respiratory: Denies cough, dyspnea or wheezes Cardiovascular: Denies palpitation, chest discomfort or lower extremity swelling Gastrointestinal:  Denies nausea, heartburn or change in bowel habits Skin: Denies abnormal skin rashes Lymphatics: Denies new lymphadenopathy  Neurological:Denies numbness, tingling or new weaknesses Behavioral/Psych: Mood is stable, no new changes  All other systems were reviewed with the patient and are negative.  PHYSICAL EXAMINATION: ECOG PERFORMANCE STATUS: 0 - Asymptomatic  Vitals:   05/02/16 0934  BP: (!) 153/60  Pulse: (!) 57  Resp: 18  Temp: 97.9 F (36.6 C)   Filed Weights   05/02/16 0934  Weight: 135 lb 11.2 oz (  61.6 kg)    GENERAL:alert, no distress and comfortable SKIN: skin color, texture, turgor are normal, no rashes or significant lesions EYES: normal, Conjunctiva are pink and non-injected, sclera clear NEURO: alert & oriented x 3 with fluent speech, no focal motor/sensory deficits  LABORATORY DATA:  I have reviewed the  data as listed     Component Value Date/Time   NA 139 09/02/2014 1409   K 4.4 09/02/2014 1409   CL 104 08/02/2012 1515   CO2 27 09/02/2014 1409   GLUCOSE 105 09/02/2014 1409   GLUCOSE 105 (H) 08/02/2012 1515   BUN 18.7 09/02/2014 1409   CREATININE 0.9 09/02/2014 1409   CALCIUM 9.2 09/02/2014 1409   PROT 6.8 09/02/2014 1409   ALBUMIN 3.9 09/02/2014 1409   AST 21 09/02/2014 1409   ALT 21 09/02/2014 1409   ALKPHOS 52 09/02/2014 1409   BILITOT 0.28 09/02/2014 1409   GFRNONAA >60 07/22/2009 0938   GFRAA  07/22/2009 0938    >60        The eGFR has been calculated using the MDRD equation. This calculation has not been validated in all clinical situations. eGFR's persistently <60 mL/min signify possible Chronic Kidney Disease.    No results found for: SPEP, UPEP  Lab Results  Component Value Date   WBC 5.8 05/02/2016   NEUTROABS 2.6 05/02/2016   HGB 13.5 05/02/2016   HCT 41.5 05/02/2016   MCV 88.3 05/02/2016   PLT 82 (L) 05/02/2016      Chemistry      Component Value Date/Time   NA 139 09/02/2014 1409   K 4.4 09/02/2014 1409   CL 104 08/02/2012 1515   CO2 27 09/02/2014 1409   BUN 18.7 09/02/2014 1409   CREATININE 0.9 09/02/2014 1409      Component Value Date/Time   CALCIUM 9.2 09/02/2014 1409   ALKPHOS 52 09/02/2014 1409   AST 21 09/02/2014 1409   ALT 21 09/02/2014 1409   BILITOT 0.28 09/02/2014 1409      ASSESSMENT & PLAN:  Thrombocytopenia Her last bone marrow biopsy in 2013 was consistent with consumption picture, likely ITP Her most recent relapse could be triggered by stress or infection. She tolerated prednisone poorly and that was tapered off. I recommend establishing threshold of 50,000 or lower before we start her back on anything. The patient is currently asymptomatic. I will see her on a yearly basis with repeat history, physical examination and blood work   All questions were answered. The patient knows to call the clinic with any problems,  questions or concerns. No barriers to learning was detected.  I spent 10 minutes counseling the patient face to face. The total time spent in the appointment was 15 minutes and more than 50% was on counseling.     [facility], Adrienne Koskela, MD 8/14/201710:56 AM

## 2016-05-02 NOTE — Assessment & Plan Note (Signed)
Her last bone marrow biopsy in 2013 was consistent with consumption picture, likely ITP Her most recent relapse could be triggered by stress or infection. She tolerated prednisone poorly and that was tapered off. I recommend establishing threshold of 50,000 or lower before we start her back on anything. The patient is currently asymptomatic. I will see her on a yearly basis with repeat history, physical examination and blood work

## 2016-06-07 ENCOUNTER — Other Ambulatory Visit: Payer: Self-pay | Admitting: *Deleted

## 2016-06-07 MED ORDER — SOTALOL HCL 160 MG PO TABS: 160.0000 mg | ORAL_TABLET | Freq: Two times a day (BID) | ORAL | 0 refills | Status: DC

## 2016-07-10 ENCOUNTER — Other Ambulatory Visit: Payer: Self-pay | Admitting: Interventional Cardiology

## 2016-08-01 ENCOUNTER — Telehealth: Payer: Self-pay | Admitting: Interventional Cardiology

## 2016-08-01 MED ORDER — SOTALOL HCL 160 MG PO TABS: 160.0000 mg | ORAL_TABLET | Freq: Two times a day (BID) | ORAL | 1 refills | Status: DC

## 2016-08-01 NOTE — Telephone Encounter (Signed)
1. Sotalol 160mg  1 tab 2xday 2. [facility] 3. 30 day supply

## 2016-08-01 NOTE — Telephone Encounter (Signed)
Pt's Rx was sent to pt's pharmacy as requested. Confirmation received.  °

## 2016-08-08 ENCOUNTER — Other Ambulatory Visit: Payer: Self-pay | Admitting: Interventional Cardiology

## 2016-08-08 NOTE — Telephone Encounter (Signed)
sotalol (BETAPACE) 160 MG tablet  Medication  Date: 08/01/2016 Department: [facility] [place] Office Ordering/Authorizing: Belva Crome, MD  Order Providers   Prescribing Provider Encounter Provider  Belva Crome, MD Belva Crome, MD  Medication Detail    Disp Refills Start End   sotalol (BETAPACE) 160 MG tablet 60 tablet 1 08/01/2016    Sig - Route: Take 1 tablet (160 mg total) by mouth 2 (two) times daily. - Oral   Notes to Pharmacy: Please keep upcoming appointment for future refills. Thank you   E-Prescribing Status: Receipt confirmed by pharmacy (08/01/2016 9:33 AM EST)   Pharmacy   [id number] - [place]

## 2016-09-25 ENCOUNTER — Other Ambulatory Visit: Payer: Self-pay | Admitting: Interventional Cardiology

## 2016-09-29 ENCOUNTER — Encounter: Payer: Self-pay | Admitting: Interventional Cardiology

## 2016-09-29 ENCOUNTER — Ambulatory Visit (INDEPENDENT_AMBULATORY_CARE_PROVIDER_SITE_OTHER): Payer: Medicare Other | Admitting: Interventional Cardiology

## 2016-09-29 VITALS — BP 176/92 | HR 52 | Ht 62.0 in | Wt 135.0 lb

## 2016-09-29 DIAGNOSIS — I421 Obstructive hypertrophic cardiomyopathy: Secondary | ICD-10-CM | POA: Diagnosis not present

## 2016-09-29 DIAGNOSIS — Z9889 Other specified postprocedural states: Secondary | ICD-10-CM

## 2016-09-29 DIAGNOSIS — I480 Paroxysmal atrial fibrillation: Secondary | ICD-10-CM | POA: Diagnosis not present

## 2016-09-29 DIAGNOSIS — E785 Hyperlipidemia, unspecified: Secondary | ICD-10-CM | POA: Insufficient documentation

## 2016-09-29 DIAGNOSIS — I10 Essential (primary) hypertension: Secondary | ICD-10-CM

## 2016-09-29 DIAGNOSIS — I6302 Cerebral infarction due to thrombosis of basilar artery: Secondary | ICD-10-CM | POA: Diagnosis not present

## 2016-09-29 MED ORDER — AMLODIPINE BESYLATE 5 MG PO TABS: 5.0000 mg | ORAL_TABLET | Freq: Every day | ORAL | 3 refills | Status: AC

## 2016-09-29 NOTE — Progress Notes (Signed)
Cardiology Office Note    Date:  09/29/2016   ID:  [name], DOB 01-Dec-1932, MRN [id number]  PCP:  [name], MD  Cardiologist: Sinclair Grooms, MD   Chief Complaint  Patient presents with  . Congestive Heart Failure    History of Present Illness:  [name] is a 83 y.o. female  who presents for HOCM, septal myectomy 2010, CABG 2010, and MV repair 2010. Has h/o PAF and not on anticoagulation due to prior intracranial hemorrhage.  She has no cardiac complaints. She does note some difficulty with balance. She has not had dyspnea or chest pain. There is no peripheral edema. She denies orthopnea. No episodes of prolonged palpitation or irregular heartbeat.   Past Medical History:  Diagnosis Date  . Bilateral hearing loss   . CAD (coronary artery disease) 2003   on Plavix.   . Fatigue   . Gross hematuria 07/03/2015  . Hemorrhagic stroke ([place]) 2010  . Hiatal hernia   . Hypercholesteremia   . ITP (idiopathic thrombocytopenic purpura) 01/04/2016  . Kidney stones   . Osteoporosis   . Pancreatitis 1970  . Pancreatitis   . Thrombocytopenia ([place])   . Unspecified deficiency anemia     Past Surgical History:  Procedure Laterality Date  . APPENDECTOMY    . CARDIAC VALVE SURGERY  2010  . COLONOSCOPY     [facility], Dr. Wynetta Emery  . COLONOSCOPY WITH PROPOFOL N/A 04/09/2013   Procedure: COLONOSCOPY WITH PROPOFOL;  Surgeon: Garlan Fair, MD;  Location: [facility];  Service: Endoscopy;  Laterality: N/A;  . CORONARY ARTERY BYPASS GRAFT  210  . FLEXIBLE SIGMOIDOSCOPY N/A 08/03/2015   Procedure: FLEXIBLE SIGMOIDOSCOPY;  Surgeon: Garlan Fair, MD;  Location: [facility];  Service: Endoscopy;  Laterality: N/A;  . TUBAL LIGATION      Current Medications: Outpatient Medications Prior to Visit  Medication Sig Dispense Refill  . calcium citrate (CALCITRATE - DOSED IN MG ELEMENTAL CALCIUM) 950 MG tablet Take 1 tablet by mouth daily.    . cetirizine  (ZYRTEC) 10 MG tablet Take 10 mg by mouth daily.    . Cholecalciferol (VITAMIN D-3 PO) Take 1 tablet by mouth daily.    Marland Kitchen EPINEPHrine (ADRENALIN) 1 MG/ML injection Inject 1 mg as directed as needed for anaphylaxis. Reported on 02/01/2016    . ezetimibe (ZETIA) 10 MG tablet Take 1 tablet (10 mg total) by mouth daily. 30 tablet 6  . Lysine 500 MG CAPS Take 1 capsule by mouth 2 (two) times daily as needed (fever blisters).     . meclizine (ANTIVERT) 25 MG tablet Take 25 mg by mouth daily as needed for dizziness.     . nitroGLYCERIN (NITROSTAT) 0.4 MG SL tablet Place 0.4 mg under the tongue every 5 (five) minutes as needed for chest pain. Reported on 02/01/2016    . Omega 3 1000 MG CAPS Take 1 each by mouth 2 (two) times daily.    Marland Kitchen omeprazole (PRILOSEC) 40 MG capsule Take 40 mg by mouth daily.    . Prenatal Vit-Fe Fumarate-FA (PRENATAL VITAMIN PO) Take 1 tablet by mouth 2 (two) times daily.    . sotalol (BETAPACE) 160 MG tablet TAKE 1 TABLET (160 MG TOTAL) BY MOUTH 2 (TWO) TIMES DAILY. 60 tablet 0   No facility-administered medications prior to visit.      Allergies:   Bee venom; Lyrica [pregabalin]; Codeine; Demerol [meperidine]; Ambien [zolpidem tartrate]; Aspirin; Celebrex [celecoxib]; Darvocet [propoxyphene n-acetaminophen]; Fosamax [alendronate sodium];  Lipitor [atorvastatin]; Morphine and related; Valium [diazepam]; and Iodine   Social History   Social History  . Marital status: Married    Spouse name: Ed  . Number of children: 2  . Years of education: College   Occupational History  .      retired Network engineer    Social History Main Topics  . Smoking status: Never Smoker  . Smokeless tobacco: Never Used  . Alcohol use No  . Drug use: No  . Sexual activity: Not Asked   Other Topics Concern  . None   Social History Narrative   Pt lives at home with spouse.   Caffeine Use: Rarely     Family History:  The patient's family history includes Cancer in her brother and sister;  Cancer (age of onset: 70) in her mother; Heart attack in her father.   ROS:   Please see the history of present illness.    A recent fall. This occurred greater than 2 months ago. Decreased memory and lower back pain or major concerns for her.  All other systems reviewed and are negative.   PHYSICAL EXAM:   VS:  BP (!) 176/92 (BP Location: Left Arm)   Pulse (!) 52   Ht 5\' 2"  (1.575 m)   Wt 135 lb (61.2 kg)   BMI 24.69 kg/m    GEN: Well nourished, well developed, in no acute distress  HEENT: normal  Neck: no JVD, carotid bruits, or masses Cardiac: RRR; soft pH apical systolic murmur. No rubs, or gallops,no edema  Respiratory:  clear to auscultation bilaterally, normal work of breathing GI: soft, nontender, nondistended, + BS MS: no deformity or atrophy  Skin: warm and dry, no rash Neuro:  Alert and Oriented x 3, Strength and sensation are intact Psych: euthymic mood, full affect  Wt Readings from Last 3 Encounters:  09/29/16 135 lb (61.2 kg)  05/02/16 135 lb 11.2 oz (61.6 kg)  02/19/16 135 lb 6.4 oz (61.4 kg)      Studies/Labs Reviewed:   EKG:  EKG  Sinus bradycardia, leftward axis, possible left anterior hemiblock, QS pattern in V1 and V2. Left ventricular hypertrophy. Compared to the prior tracing from August 2016, heart rate is slower but otherwise no change.  Recent Labs: 05/02/2016: HGB 13.5; Platelets 82   Lipid Panel    Component Value Date/Time   CHOL  07/22/2009 0945    120        ATP III CLASSIFICATION:  <200     mg/dL   Desirable  200-239  mg/dL   Borderline High  >=240    mg/dL   High          TRIG 82 07/22/2009 0945   HDL 31 (L) 07/22/2009 0945   CHOLHDL 3.9 07/22/2009 0945   VLDL 16 07/22/2009 0945   LDLCALC  07/22/2009 0945    73        Total Cholesterol/HDL:CHD Risk Coronary Heart Disease Risk Table                     Men   Women  1/2 Average Risk   3.4   3.3  Average Risk       5.0   4.4  2 X Average Risk   9.6   7.1  3 X Average Risk   23.4   11.0        Use the calculated Patient Ratio above and the CHD Risk Table to determine the patient's CHD Risk.  ATP III CLASSIFICATION (LDL):  <100     mg/dL   Optimal  100-129  mg/dL   Near or Above                    Optimal  130-159  mg/dL   Borderline  160-189  mg/dL   High  >190     mg/dL   Very High    Additional studies/ records that were reviewed today include:  Echocardiogram, 2016: Study Conclusions  - Left ventricle: The cavity size was normal. Wall thickness was   increased in a pattern of mild LVH. S/p septal myectomy. No   apparent residual LV outflow tract gradient. Systolic function   was normal. The estimated ejection fraction was in the range of   60% to 65%. Wall motion was normal; there were no regional wall   motion abnormalities. Doppler parameters are consistent with   abnormal left ventricular relaxation (grade 1 diastolic   dysfunction). - Aortic valve: There was no stenosis. There was trivial   regurgitation. - Mitral valve: S/p MV repair. There is chordal SAM. There was no   evidence for stenosis. There was mild regurgitation. Mean   gradient (D): 2 mm Hg. - Left atrium: The atrium was mildly dilated. - Right ventricle: The cavity size was normal. Systolic function   was normal. - Tricuspid valve: Peak RV-RA gradient (S): 15 mm Hg. - Pulmonary arteries: PA peak pressure: 18 mm Hg (S). - Inferior vena cava: The vessel was normal in size. The   respirophasic diameter changes were in the normal range (= 50%),   consistent with normal central venous pressure.  Impressions:  - Normal LV size with mild LV hypertrophy (s/p septal myectomy, no   apparent residual significant LV outflow tract gradient). EF   60-65%. Normal RV size and systolic function. Status post mitral valve repair with mild mitral regurgitation.   ASSESSMENT:    1. Hypertrophic obstructive cardiomyopathy (HCC)   2. Paroxysmal atrial fibrillation ([place])   3.  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 S/P mitral valve repair   5. Essential hypertension   6. Hyperlipidemia LDL goal <70      PLAN:  In order of problems listed above:  1. Asymptomatic. 2. No obvious clinical recurrences. She is allergic to aspirin and anticoagulants are not being used because of prior intracranial hemorrhage. 3. No new neurological complaints. 4. Faint apical systolic murmur. Most recent echo from 18 months ago is noted above. 5. New problem. I repeated the blood pressure and confirmed a level of 180/82 mmHg which was similar in both arms. Low-salt diet discussed. Start amlodipine 5 mg daily. Hypertension clinic in 2-3 weeks. 6. Lipid panel and comprehensive metabolic panel are drawn today to follow-up on current metabolic and lipid parameters.    Medication Adjustments/Labs and Tests Ordered: Current medicines are reviewed at length with the patient today.  Concerns regarding medicines are outlined above.  Medication changes, Labs and Tests ordered today are listed in the Patient Instructions below. Patient Instructions  Medication Instructions:  1) START Amlodipine 5mg  once daily  Labwork: CMET and Lipids today  Testing/Procedures: None  Follow-Up: Your physician recommends that you schedule a follow-up appointment in: 2-3 weeks with Pharmacist in Hypertension Clinic.  Your physician wants you to follow-up in: 9-12 months with Dr. Tamala Julian.  You will receive a reminder letter in the mail two months in advance. If you don't receive a letter, please  call our office to schedule the follow-up appointment.    Any Other Special Instructions Will Be Listed Below (If Applicable).     If you need a refill on your cardiac medications before your next appointment, please call your pharmacy.     Signed, Sinclair Grooms, MD  09/29/2016 9:04 AM    [facility] [place], [place], [place]   [id number] Phone: [phone]; Fax: [phone]

## 2016-09-29 NOTE — Patient Instructions (Signed)
Medication Instructions:  1) START Amlodipine 5mg  once daily  Labwork: CMET and Lipids today  Testing/Procedures: None  Follow-Up: Your physician recommends that you schedule a follow-up appointment in: 2-3 weeks with Pharmacist in Hypertension Clinic.  Your physician wants you to follow-up in: 9-12 months with Dr. Tamala Julian.  You will receive a reminder letter in the mail two months in advance. If you don't receive a letter, please call our office to schedule the follow-up appointment.    Any Other Special Instructions Will Be Listed Below (If Applicable).     If you need a refill on your cardiac medications before your next appointment, please call your pharmacy.

## 2016-09-30 LAB — COMPREHENSIVE METABOLIC PANEL
A/G RATIO: 1.7 (ref 1.2–2.2)
ALBUMIN: 4.5 g/dL (ref 3.5–4.7)
ALT: 19 IU/L (ref 0–32)
AST: 22 IU/L (ref 0–40)
Alkaline Phosphatase: 65 IU/L (ref 39–117)
BILIRUBIN TOTAL: 0.3 mg/dL (ref 0.0–1.2)
BUN / CREAT RATIO: 21 (ref 12–28)
BUN: 20 mg/dL (ref 8–27)
CHLORIDE: 99 mmol/L (ref 96–106)
CO2: 24 mmol/L (ref 18–29)
Calcium: 9.6 mg/dL (ref 8.7–10.3)
Creatinine, Ser: 0.94 mg/dL (ref 0.57–1.00)
GFR calc non Af Amer: 57 mL/min/{1.73_m2} — ABNORMAL LOW (ref >59)
GFR, EST AFRICAN AMERICAN: 66 mL/min/{1.73_m2} (ref >59)
GLOBULIN, TOTAL: 2.6 g/dL (ref 1.5–4.5)
GLUCOSE: 115 mg/dL — AB (ref 65–99)
Potassium: 4.6 mmol/L (ref 3.5–5.2)
SODIUM: 139 mmol/L (ref 134–144)
TOTAL PROTEIN: 7.1 g/dL (ref 6.0–8.5)

## 2016-09-30 LAB — LIPID PANEL
Chol/HDL Ratio: 2.7 ratio units (ref 0.0–4.4)
Cholesterol, Total: 125 mg/dL (ref 100–199)
HDL: 46 mg/dL (ref >39)
LDL Calculated: 50 mg/dL (ref 0–99)
Triglycerides: 143 mg/dL (ref 0–149)
VLDL CHOLESTEROL CAL: 29 mg/dL (ref 5–40)

## 2016-10-20 ENCOUNTER — Ambulatory Visit (INDEPENDENT_AMBULATORY_CARE_PROVIDER_SITE_OTHER): Payer: Medicare Other | Admitting: Pharmacist

## 2016-10-20 VITALS — BP 142/74 | HR 50

## 2016-10-20 DIAGNOSIS — I421 Obstructive hypertrophic cardiomyopathy: Secondary | ICD-10-CM | POA: Diagnosis not present

## 2016-10-20 NOTE — Patient Instructions (Signed)
Take your BP meds as follows: Continue medications as prescribed.   Exercise as you're able, try to walk approximately 30 minutes per day.  Keep salt intake to a minimum, especially watch canned and prepared boxed foods.  Eat more fresh fruits and vegetables and fewer canned items.  Avoid eating in fast food restaurants.    HOW TO TAKE YOUR BLOOD PRESSURE: . Rest 5 minutes before taking your blood pressure. .  Don't smoke or drink caffeinated beverages for at least 30 minutes before. . Take your blood pressure before (not after) you eat. . Sit comfortably with your back supported and both feet on the floor (don't cross your legs). . Elevate your arm to heart level on a table or a desk. . Use the proper sized cuff. It should fit smoothly and snugly around your bare upper arm. There should be enough room to slip a fingertip under the cuff. The bottom edge of the cuff should be 1 inch above the crease of the elbow. . Ideally, take 3 measurements at one sitting and record the average.

## 2016-10-20 NOTE — Progress Notes (Signed)
Patient ID: [name]                 DOB: 22-Dec-1932                      MRN: [id number]     HPI: [name] is a 83 y.o. female patient of Dr. Tamala Julian with PMH below who presents today for hypertension evaluation. At her most recent visit with Dr. Tamala Julian her pressure was elevated to 176/92. She does have some difficulty with balance at baseline. She was started on amlodipine 5mg  and instructed to follow a low sodium diet.   She presents today with her husband who is upset that the visit is with a pharmacist. He states that pharmacists are not able to do anything that anybody else can't do and are useless.   She denies any additional dizziness after starting amlodipine. She reports that her blood pressure was immediately lower once she arrived home from her visit. She has not yet taken her medications this morning.   She also reports that she fell down the stairs a few weeks ago after being angry at her husband. She was not dizzy at the time.    Cardiac Hx: hypertrophic obstructive cardiomyopathy, CVA, Afib (not on anticoagulation due to previous intracranial hemorrhage), s/p mitral valve repair, HLD, s/p CABG in 2010.   Current cardiac meds:  Sotalol 160mg  BID Amlodipine 5mg  daily   BP goal: <140/90 given age and balance difficulties  Family History:  Father passed away from MI. Mother passed from cancer.   Social History: Never tobacco user or alcohol.  Diet: She eats a diabetic diet. No salt added at the table. Denies any caffiene and states she drinks most water and cranberry juice.   Exercise: She walks 61mile or 20 minutes every day.   Home BP readings: States home reading are 120s/60s.   Wt Readings from Last 3 Encounters:  09/29/16 135 lb (61.2 kg)  05/02/16 135 lb 11.2 oz (61.6 kg)  02/19/16 135 lb 6.4 oz (61.4 kg)   BP Readings from Last 3 Encounters:  10/20/16 (!) 142/74  09/29/16 (!) 176/92  05/02/16 (!) 153/60   Pulse Readings from Last 3  Encounters:  10/20/16 (!) 50  09/29/16 (!) 52  05/02/16 (!) 57    Renal function: CrCl cannot be calculated (Unknown ideal weight.).  Past Medical History:  Diagnosis Date  . Bilateral hearing loss   . CAD (coronary artery disease) 2003   on Plavix.   . Fatigue   . Gross hematuria 07/03/2015  . Hemorrhagic stroke ([place]) 2010  . Hiatal hernia   . Hypercholesteremia   . ITP (idiopathic thrombocytopenic purpura) 01/04/2016  . Kidney stones   . Osteoporosis   . Pancreatitis 1970  . Pancreatitis   . Thrombocytopenia ([place])   . Unspecified deficiency anemia     Current Outpatient Prescriptions on File Prior to Visit  Medication Sig Dispense Refill  . amLODipine (NORVASC) 5 MG tablet Take 1 tablet (5 mg total) by mouth daily. 90 tablet 3  . calcium citrate (CALCITRATE - DOSED IN MG ELEMENTAL CALCIUM) 950 MG tablet Take 1 tablet by mouth daily.    . cetirizine (ZYRTEC) 10 MG tablet Take 10 mg by mouth daily.    . Cholecalciferol (VITAMIN D-3 PO) Take 1 tablet by mouth daily.    Marland Kitchen ezetimibe (ZETIA) 10 MG tablet Take 1 tablet (10 mg total) by mouth daily. 30 tablet 6  . Lysine  500 MG CAPS Take 1 capsule by mouth 2 (two) times daily as needed (fever blisters).     . Omega 3 1000 MG CAPS Take 1 each by mouth daily.     Marland Kitchen omeprazole (PRILOSEC) 40 MG capsule Take 40 mg by mouth daily.    . Prenatal Vit-Fe Fumarate-FA (PRENATAL VITAMIN PO) Take 1 tablet by mouth 2 (two) times daily.    . rosuvastatin (CRESTOR) 10 MG tablet Take 10 mg by mouth daily.  3  . sotalol (BETAPACE) 160 MG tablet TAKE 1 TABLET (160 MG TOTAL) BY MOUTH 2 (TWO) TIMES DAILY. 60 tablet 0  . EPINEPHrine (ADRENALIN) 1 MG/ML injection Inject 1 mg as directed as needed for anaphylaxis. Reported on 02/01/2016    . meclizine (ANTIVERT) 25 MG tablet Take 25 mg by mouth daily as needed for dizziness.     . nitroGLYCERIN (NITROSTAT) 0.4 MG SL tablet Place 0.4 mg under the tongue every 5 (five) minutes as needed for chest pain.  Reported on 02/01/2016     No current facility-administered medications on file prior to visit.     Allergies  Allergen Reactions  . Bee Venom Anaphylaxis  . Lyrica [Pregabalin]   . Codeine Other (See Comments)    Reaction: hallucinate  . Demerol [Meperidine] Hives and Other (See Comments)    REACTION: Welts  . Ambien [Zolpidem Tartrate] Other (See Comments)    Altered Mental Status  . Aspirin Other (See Comments)    Bleeding, aneurysm- when she was on plavix  . Celebrex [Celecoxib] Other (See Comments)    Legs hurt  . Darvocet [Propoxyphene N-Acetaminophen] Other (See Comments)    Hallucinates  . Fosamax [Alendronate Sodium] Other (See Comments)    Legs hurt  . Lipitor [Atorvastatin] Other (See Comments)    Legs hurt  . Morphine And Related Other (See Comments)    Hallucinations  . Valium [Diazepam] Other (See Comments)    Altered Mental Status  . Iodine Hives    Welts    Blood pressure (!) 142/74, pulse (!) 50.   Assessment/Plan: Hypertension: BP slightly elevated today potentially secondary to missed doses of morning medications. Patient and husband refuse another visit with a pharmacist. Follow up with Dr. Tamala Julian as indicated.    Thank you, Lelan Pons. Patterson Hammersmith, [facility]  10/20/2016 8:49 AM

## 2016-10-22 ENCOUNTER — Other Ambulatory Visit: Payer: Self-pay | Admitting: Interventional Cardiology

## 2017-02-27 ENCOUNTER — Other Ambulatory Visit: Payer: Self-pay | Admitting: Family Medicine

## 2017-02-27 ENCOUNTER — Ambulatory Visit
Admission: RE | Admit: 2017-02-27 | Discharge: 2017-02-27 | Disposition: A | Discharge status: Home / Self Care | Payer: Medicare Other | Source: Ambulatory Visit | Attending: Family Medicine | Admitting: Family Medicine

## 2017-02-27 DIAGNOSIS — M545 Low back pain, unspecified: Secondary | ICD-10-CM

## 2017-03-22 DIAGNOSIS — R410 Disorientation, unspecified: Secondary | ICD-10-CM | POA: Diagnosis not present

## 2017-03-22 DIAGNOSIS — M549 Dorsalgia, unspecified: Secondary | ICD-10-CM

## 2017-03-22 DIAGNOSIS — Z8679 Personal history of other diseases of the circulatory system: Secondary | ICD-10-CM

## 2017-03-22 DIAGNOSIS — I10 Essential (primary) hypertension: Secondary | ICD-10-CM

## 2017-03-22 DIAGNOSIS — R42 Dizziness and giddiness: Secondary | ICD-10-CM | POA: Diagnosis not present

## 2017-03-23 DIAGNOSIS — Z8679 Personal history of other diseases of the circulatory system: Secondary | ICD-10-CM | POA: Diagnosis not present

## 2017-03-23 DIAGNOSIS — R42 Dizziness and giddiness: Secondary | ICD-10-CM | POA: Diagnosis not present

## 2017-03-23 DIAGNOSIS — M549 Dorsalgia, unspecified: Secondary | ICD-10-CM | POA: Diagnosis not present

## 2017-03-23 DIAGNOSIS — I10 Essential (primary) hypertension: Secondary | ICD-10-CM | POA: Diagnosis not present

## 2017-03-24 DIAGNOSIS — R42 Dizziness and giddiness: Secondary | ICD-10-CM | POA: Diagnosis not present

## 2017-03-24 DIAGNOSIS — M549 Dorsalgia, unspecified: Secondary | ICD-10-CM | POA: Diagnosis not present

## 2017-03-24 DIAGNOSIS — I10 Essential (primary) hypertension: Secondary | ICD-10-CM | POA: Diagnosis not present

## 2017-03-24 DIAGNOSIS — Z8679 Personal history of other diseases of the circulatory system: Secondary | ICD-10-CM | POA: Diagnosis not present

## 2017-03-25 DIAGNOSIS — M549 Dorsalgia, unspecified: Secondary | ICD-10-CM | POA: Diagnosis not present

## 2017-03-25 DIAGNOSIS — Z8679 Personal history of other diseases of the circulatory system: Secondary | ICD-10-CM | POA: Diagnosis not present

## 2017-03-25 DIAGNOSIS — I10 Essential (primary) hypertension: Secondary | ICD-10-CM | POA: Diagnosis not present

## 2017-03-25 DIAGNOSIS — R42 Dizziness and giddiness: Secondary | ICD-10-CM | POA: Diagnosis not present

## 2017-05-02 ENCOUNTER — Ambulatory Visit (HOSPITAL_BASED_OUTPATIENT_CLINIC_OR_DEPARTMENT_OTHER): Payer: Medicare Other | Admitting: Hematology and Oncology

## 2017-05-02 ENCOUNTER — Encounter: Payer: Self-pay | Admitting: Hematology and Oncology

## 2017-05-02 ENCOUNTER — Other Ambulatory Visit (HOSPITAL_BASED_OUTPATIENT_CLINIC_OR_DEPARTMENT_OTHER): Payer: Medicare Other

## 2017-05-02 ENCOUNTER — Telehealth: Payer: Self-pay | Admitting: Hematology and Oncology

## 2017-05-02 DIAGNOSIS — D696 Thrombocytopenia, unspecified: Secondary | ICD-10-CM

## 2017-05-02 DIAGNOSIS — D693 Immune thrombocytopenic purpura: Secondary | ICD-10-CM

## 2017-05-02 DIAGNOSIS — I6302 Cerebral infarction due to thrombosis of basilar artery: Secondary | ICD-10-CM

## 2017-05-02 DIAGNOSIS — I639 Cerebral infarction, unspecified: Secondary | ICD-10-CM

## 2017-05-02 LAB — CBC WITH DIFFERENTIAL/PLATELET
BASO%: 0.6 % (ref 0.0–2.0)
BASOS ABS: 0 10*3/uL (ref 0.0–0.1)
EOS%: 2 % (ref 0.0–7.0)
Eosinophils Absolute: 0.1 10*3/uL (ref 0.0–0.5)
HEMATOCRIT: 37.1 % (ref 34.8–46.6)
HGB: 12.5 g/dL (ref 11.6–15.9)
LYMPH%: 37 % (ref 14.0–49.7)
MCH: 28.9 pg (ref 25.1–34.0)
MCHC: 33.8 g/dL (ref 31.5–36.0)
MCV: 85.6 fL (ref 79.5–101.0)
MONO#: 0.7 10*3/uL (ref 0.1–0.9)
MONO%: 11.5 % (ref 0.0–14.0)
NEUT#: 2.9 10*3/uL (ref 1.5–6.5)
NEUT%: 48.9 % (ref 38.4–76.8)
Platelets: 34 10*3/uL — ABNORMAL LOW (ref 145–400)
RBC: 4.33 10*6/uL (ref 3.70–5.45)
RDW: 14.4 % (ref 11.2–14.5)
WBC: 5.9 10*3/uL (ref 3.9–10.3)
lymph#: 2.2 10*3/uL (ref 0.9–3.3)

## 2017-05-02 NOTE — Progress Notes (Signed)
[place] OFFICE PROGRESS NOTE  Adrienne Ada, MD SUMMARY OF HEMATOLOGIC HISTORY:  This is a pleasant lady with chronic thrombocytopenia. The patient was discovered to have low platelet count when she was placed on Plavix for coronary artery disease. 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 She is being observed. On 01/04/2016, she was started on prednisone therapy for ITP when her platelet count dip under  50,000 May 2017, she tolerated prednisone poorly and prednisone is slowly tapered off INTERVAL HISTORY: [name] 83 y.o. female returns for further follow-up with her husband She has occasional hemorrhoidal bleeding. Denies recent spontaneous hematuria or epistaxis She denies recent chest pain or shortness of breath Apparently, the patient had recent hospitalization.  I have reviewed the past medical history, past surgical history, social history and family history with the patient and they are unchanged from previous note.  ALLERGIES:  is allergic to bee venom; lyrica [pregabalin]; codeine; demerol [meperidine]; ambien [zolpidem tartrate]; aspirin; celebrex [celecoxib]; darvocet [propoxyphene n-acetaminophen]; fosamax [alendronate sodium]; lipitor [atorvastatin]; morphine and related; valium [diazepam]; and iodine.  MEDICATIONS:  Current Outpatient Prescriptions  Medication Sig Dispense Refill  . amLODipine (NORVASC) 5 MG tablet Take 1 tablet (5 mg total) by mouth daily. 90 tablet 3  . calcium citrate (CALCITRATE - DOSED IN MG ELEMENTAL CALCIUM) 950 MG tablet Take 1 tablet by mouth daily.    . cetirizine (ZYRTEC) 10 MG tablet Take 10 mg by mouth daily.    . Cholecalciferol (VITAMIN D-3 PO) Take 1 tablet by mouth daily.    Marland Kitchen EPINEPHrine (ADRENALIN) 1 MG/ML injection Inject 1 mg as  directed as needed for anaphylaxis. Reported on 02/01/2016    . ezetimibe (ZETIA) 10 MG tablet Take 1 tablet (10 mg total) by mouth daily. 30 tablet 6  . Lysine 500 MG CAPS Take 1 capsule by mouth 2 (two) times daily as needed (fever blisters).     . meclizine (ANTIVERT) 25 MG tablet Take 25 mg by mouth daily as needed for dizziness.     . Multiple Vitamins-Minerals (CENTRUM SILVER 50+WOMEN PO) Take 1 tablet by mouth daily.    . nitroGLYCERIN (NITROSTAT) 0.4 MG SL tablet Place 0.4 mg under the tongue every 5 (five) minutes as needed for chest pain. Reported on 02/01/2016    . Omega 3 1000 MG CAPS Take 1 each by mouth daily.     Marland Kitchen omeprazole (PRILOSEC) 40 MG capsule Take 40 mg by mouth daily.    . Prenatal Vit-Fe Fumarate-FA (PRENATAL VITAMIN PO) Take 1 tablet by mouth 2 (two) times daily.    . psyllium (METAMUCIL) 58.6 % powder Take 1 packet by mouth daily.    . rosuvastatin (CRESTOR) 10 MG tablet Take 10 mg by mouth daily.  3  . sotalol (BETAPACE) 160 MG tablet TAKE 1 TABLET (160 MG TOTAL) BY MOUTH 2 (TWO) TIMES DAILY. 60 tablet 10   No current facility-administered medications for this visit.      REVIEW OF SYSTEMS:   Constitutional: Denies fevers, chills or night sweats Eyes: Denies blurriness of vision Ears, nose, mouth, throat, and face: Denies mucositis or sore throat Respiratory: Denies cough, dyspnea or wheezes Cardiovascular: Denies palpitation, chest discomfort or lower extremity swelling Gastrointestinal:  Denies nausea, heartburn or change in bowel habits Skin: Denies abnormal skin rashes Lymphatics: Denies new lymphadenopathy  Neurological:Denies numbness, tingling  or new weaknesses Behavioral/Psych: Mood is stable, no new changes  All other systems were reviewed with the patient and are negative.  PHYSICAL EXAMINATION: ECOG PERFORMANCE STATUS: 2 - Symptomatic, <50% confined to bed  Vitals:   05/02/17 0925  BP: (!) 137/48  Pulse: (!) 52  Resp: 18  Temp: 97.9 F (36.6  C)  SpO2: 99%   Filed Weights   05/02/17 0925  Weight: 136 lb 3.2 oz (61.8 kg)    GENERAL:alert, no distress and comfortable SKIN: skin color, texture, turgor are normal, no rashes or significant lesions.  Noted skin bruising EYES: normal, Conjunctiva are pink and non-injected, sclera clear OROPHARYNX:no exudate, no erythema and lips, buccal mucosa, and tongue normal  NECK: supple, thyroid normal size, non-tender, without nodularity LYMPH:  no palpable lymphadenopathy in the cervical, axillary or inguinal LUNGS: clear to auscultation and percussion with normal breathing effort HEART: regular rate & rhythm with soft left heart murmurs and no lower extremity edema ABDOMEN:abdomen soft, non-tender and normal bowel sounds Musculoskeletal:no cyanosis of digits and no clubbing  NEURO: alert & oriented x 3 with fluent speech, no focal motor/sensory deficits  LABORATORY DATA:  I have reviewed the data as listed     Component Value Date/Time   NA 139 09/29/2016 0915   NA 139 09/02/2014 1409   K 4.6 09/29/2016 0915   K 4.4 09/02/2014 1409   CL 99 09/29/2016 0915   CL 104 08/02/2012 1515   CO2 24 09/29/2016 0915   CO2 27 09/02/2014 1409   GLUCOSE 115 (H) 09/29/2016 0915   GLUCOSE 105 09/02/2014 1409   GLUCOSE 105 (H) 08/02/2012 1515   BUN 20 09/29/2016 0915   BUN 18.7 09/02/2014 1409   CREATININE 0.94 09/29/2016 0915   CREATININE 0.9 09/02/2014 1409   CALCIUM 9.6 09/29/2016 0915   CALCIUM 9.2 09/02/2014 1409   PROT 7.1 09/29/2016 0915   PROT 6.8 09/02/2014 1409   ALBUMIN 4.5 09/29/2016 0915   ALBUMIN 3.9 09/02/2014 1409   AST 22 09/29/2016 0915   AST 21 09/02/2014 1409   ALT 19 09/29/2016 0915   ALT 21 09/02/2014 1409   ALKPHOS 65 09/29/2016 0915   ALKPHOS 52 09/02/2014 1409   BILITOT 0.3 09/29/2016 0915   BILITOT 0.28 09/02/2014 1409   GFRNONAA 57 (L) 09/29/2016 0915   GFRAA 66 09/29/2016 0915    No results found for: SPEP, UPEP  Lab Results  Component Value Date    WBC 5.9 05/02/2017   NEUTROABS 2.9 05/02/2017   HGB 12.5 05/02/2017   HCT 37.1 05/02/2017   MCV 85.6 05/02/2017   PLT 34 (L) 05/02/2017      Chemistry      Component Value Date/Time   NA 139 09/29/2016 0915   NA 139 09/02/2014 1409   K 4.6 09/29/2016 0915   K 4.4 09/02/2014 1409   CL 99 09/29/2016 0915   CL 104 08/02/2012 1515   CO2 24 09/29/2016 0915   CO2 27 09/02/2014 1409   BUN 20 09/29/2016 0915   BUN 18.7 09/02/2014 1409   CREATININE 0.94 09/29/2016 0915   CREATININE 0.9 09/02/2014 1409      Component Value Date/Time   CALCIUM 9.6 09/29/2016 0915   CALCIUM 9.2 09/02/2014 1409   ALKPHOS 65 09/29/2016 0915   ALKPHOS 52 09/02/2014 1409   AST 22 09/29/2016 0915   AST 21 09/02/2014 1409   ALT 19 09/29/2016 0915   ALT 21 09/02/2014 1409   BILITOT 0.3 09/29/2016 0915  BILITOT 0.28 09/02/2014 1409      ASSESSMENT & PLAN:  Thrombocytopenia The most likely cause of her low platelet count is acute on chronic ITP We discussed potential further workup in the near future Previously, she responded well to prednisone but could not tolerate chronic prednisone therapy Compliance is very poor I have long discussion with the patient and her husband related to pathophysiology of chronic ITP I am reluctant to recommend prednisone therapy again According to the patient, she is allergic to "everything" and she will likely have side effects on "everything" I recommend repeat blood work in 3 days and IVIG treatment I also discussed briefly the role of rituximab if she does not response to IVIG We also discussed other palliative options such as platelet transfusion only or growth factor support The risks, benefits, side effects of IVIG is discussed with the patient and her husband I will resume further discussion at the end of the week  CVA (cerebral vascular accident) [facility]) The patient had history of stroke She claims she had poor memory because of this She defer all decision  making to her husband.   Orders Placed This Encounter  Procedures  . Comprehensive metabolic panel    Standing Status:   Future    Standing Expiration Date:   06/06/2018    All questions were answered. The patient knows to call the clinic with any problems, questions or concerns. No barriers to learning was detected.  I spent 25 minutes counseling the patient face to face. The total time spent in the appointment was 30 minutes and more than 50% was on counseling.     Heath Lark, MD 8/14/20183:08 PM

## 2017-05-02 NOTE — Assessment & Plan Note (Addendum)
The most likely cause of her low platelet count is acute on chronic ITP We discussed potential further workup in the near future Previously, she responded well to prednisone but could not tolerate chronic prednisone therapy Compliance is very poor I have long discussion with the patient and her husband related to pathophysiology of chronic ITP I am reluctant to recommend prednisone therapy again According to the patient, she is allergic to "everything" and she will likely have side effects on "everything" I recommend repeat blood work in 3 days and IVIG treatment I also discussed briefly the role of rituximab if she does not response to IVIG We also discussed other palliative options such as platelet transfusion only or growth factor support The risks, benefits, side effects of IVIG is discussed with the patient and her husband I will resume further discussion at the end of the week

## 2017-05-02 NOTE — Telephone Encounter (Signed)
Called patient regarding their upcoming appointment this Friday and left a voicemail.

## 2017-05-02 NOTE — Assessment & Plan Note (Signed)
The patient had history of stroke She claims she had poor memory because of this She defer all decision making to her husband. 

## 2017-05-04 ENCOUNTER — Encounter: Payer: Self-pay | Admitting: *Deleted

## 2017-05-05 ENCOUNTER — Ambulatory Visit (HOSPITAL_BASED_OUTPATIENT_CLINIC_OR_DEPARTMENT_OTHER): Payer: Medicare Other | Admitting: Hematology and Oncology

## 2017-05-05 ENCOUNTER — Telehealth: Payer: Self-pay | Admitting: *Deleted

## 2017-05-05 ENCOUNTER — Other Ambulatory Visit (HOSPITAL_BASED_OUTPATIENT_CLINIC_OR_DEPARTMENT_OTHER): Payer: Medicare Other

## 2017-05-05 ENCOUNTER — Encounter: Payer: Self-pay | Admitting: Hematology and Oncology

## 2017-05-05 ENCOUNTER — Telehealth: Payer: Self-pay | Admitting: Hematology and Oncology

## 2017-05-05 ENCOUNTER — Ambulatory Visit (HOSPITAL_BASED_OUTPATIENT_CLINIC_OR_DEPARTMENT_OTHER): Payer: Medicare Other

## 2017-05-05 VITALS — BP 152/56 | HR 53 | Temp 97.9°F | Resp 18

## 2017-05-05 DIAGNOSIS — D696 Thrombocytopenia, unspecified: Secondary | ICD-10-CM

## 2017-05-05 DIAGNOSIS — D693 Immune thrombocytopenic purpura: Secondary | ICD-10-CM

## 2017-05-05 DIAGNOSIS — I878 Other specified disorders of veins: Secondary | ICD-10-CM | POA: Insufficient documentation

## 2017-05-05 LAB — COMPREHENSIVE METABOLIC PANEL
ALBUMIN: 3.8 g/dL (ref 3.5–5.0)
ALK PHOS: 54 U/L (ref 40–150)
ALT: 14 U/L (ref 0–55)
AST: 19 U/L (ref 5–34)
Anion Gap: 10 mEq/L (ref 3–11)
BUN: 18.9 mg/dL (ref 7.0–26.0)
CO2: 25 mEq/L (ref 22–29)
CREATININE: 0.9 mg/dL (ref 0.6–1.1)
Calcium: 10 mg/dL (ref 8.4–10.4)
Chloride: 107 mEq/L (ref 98–109)
EGFR: 59 mL/min/{1.73_m2} — ABNORMAL LOW (ref >90)
GLUCOSE: 130 mg/dL (ref 70–140)
POTASSIUM: 4 meq/L (ref 3.5–5.1)
Sodium: 142 mEq/L (ref 136–145)
TOTAL PROTEIN: 6.9 g/dL (ref 6.4–8.3)
Total Bilirubin: 0.48 mg/dL (ref 0.20–1.20)

## 2017-05-05 LAB — CBC WITH DIFFERENTIAL/PLATELET
BASO%: 0.6 % (ref 0.0–2.0)
Basophils Absolute: 0 10*3/uL (ref 0.0–0.1)
EOS%: 1.4 % (ref 0.0–7.0)
Eosinophils Absolute: 0.1 10*3/uL (ref 0.0–0.5)
HCT: 38.8 % (ref 34.8–46.6)
HEMOGLOBIN: 13 g/dL (ref 11.6–15.9)
LYMPH#: 2.3 10*3/uL (ref 0.9–3.3)
LYMPH%: 35.9 % (ref 14.0–49.7)
MCH: 28.6 pg (ref 25.1–34.0)
MCHC: 33.5 g/dL (ref 31.5–36.0)
MCV: 85.5 fL (ref 79.5–101.0)
MONO#: 0.7 10*3/uL (ref 0.1–0.9)
MONO%: 11.3 % (ref 0.0–14.0)
NEUT%: 50.8 % (ref 38.4–76.8)
NEUTROS ABS: 3.2 10*3/uL (ref 1.5–6.5)
Platelets: 39 10*3/uL — ABNORMAL LOW (ref 145–400)
RBC: 4.54 10*6/uL (ref 3.70–5.45)
RDW: 14.4 % (ref 11.2–14.5)
WBC: 6.3 10*3/uL (ref 3.9–10.3)

## 2017-05-05 MED ORDER — SODIUM CHLORIDE 0.9 % IV SOLN
Freq: Once | INTRAVENOUS | Status: AC
  Administered 2017-05-05: 11:00:00 via INTRAVENOUS

## 2017-05-05 MED ORDER — IMMUNE GLOBULIN (HUMAN) 20 GM/200ML IV SOLN
1.0000 g/kg | Freq: Once | INTRAVENOUS | Status: AC
  Administered 2017-05-05: 60 g via INTRAVENOUS
  Filled 2017-05-05: qty 600

## 2017-05-05 MED ORDER — DIPHENHYDRAMINE HCL 25 MG PO TABS
25.0000 mg | ORAL_TABLET | Freq: Once | ORAL | Status: AC
  Administered 2017-05-05: 25 mg via ORAL
  Filled 2017-05-05: qty 1

## 2017-05-05 MED ORDER — ACETAMINOPHEN 325 MG PO TABS
650.0000 mg | ORAL_TABLET | Freq: Once | ORAL | Status: AC
  Administered 2017-05-05: 650 mg via ORAL

## 2017-05-05 NOTE — Telephone Encounter (Signed)
"  My wife has been home an hour after infusion ([facility]).  Pain to her lower back to both legs.  Upper back and ribs too.  Is there something she can do for this or is it something she's going to have to learn to live with?"  Return number [phone].  Trying to walk around to ease but no relief.  Have tylenol and ibuprofen but have not tried medicine."

## 2017-05-05 NOTE — Patient Instructions (Signed)

## 2017-05-05 NOTE — Assessment & Plan Note (Signed)
The most likely cause of her low platelet count is acute on chronic ITP We discussed potential further workup in the near future Previously, she responded well to prednisone but could not tolerate chronic prednisone therapy Compliance is very poor I have long discussion with the patient and her husband related to pathophysiology of chronic ITP I am reluctant to recommend prednisone therapy again According to the patient, she is allergic to "everything" and she will likely have side effects on "everything" I recommend IVIG treatment. The risks, benefits, side effects of IVIG is discussed and she agreed to proceed I will see her back next week to assess response to treatment If treatment works, she may need long-term venous access

## 2017-05-05 NOTE — Progress Notes (Signed)
[place] OFFICE PROGRESS NOTE  [name], MD SUMMARY OF HEMATOLOGIC HISTORY:  This is a pleasant lady with chronic thrombocytopenia. The patient was discovered to have low platelet count when she was placed on Plavix for coronary artery disease. 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 She is being observed. On 01/04/2016, she was started on prednisone therapy for ITP when her platelet count dip under  50,000 May 2017, she tolerated prednisone poorly and prednisone is slowly tapered off 05/05/2017, she had relapsed ITP and started on IVIG INTERVAL HISTORY: [name] 83 y.o. female returns for further follow-up. She feels well. Apart from bruising, she had no new symptoms The patient denies any recent signs or symptoms of bleeding such as spontaneous epistaxis, hematuria or hematochezia.   I have reviewed the past medical history, past surgical history, social history and family history with the patient and they are unchanged from previous note.  ALLERGIES:  is allergic to bee venom; iodides; lyrica [pregabalin]; codeine; demerol [meperidine]; ambien [zolpidem tartrate]; aspirin; celebrex [celecoxib]; darvocet [propoxyphene n-acetaminophen]; fosamax [alendronate sodium]; lipitor [atorvastatin]; morphine and related; valium [diazepam]; and iodine.  MEDICATIONS:  Current Outpatient Prescriptions  Medication Sig Dispense Refill  . amLODipine (NORVASC) 5 MG tablet Take 1 tablet (5 mg total) by mouth daily. 90 tablet 3  . calcium citrate (CALCITRATE - DOSED IN MG ELEMENTAL CALCIUM) 950 MG tablet Take 1 tablet by mouth daily.    . cetirizine (ZYRTEC) 10 MG tablet Take 10 mg by mouth daily.    . Cholecalciferol (VITAMIN D-3 PO) Take 1 tablet by mouth daily.    Marland Kitchen EPINEPHrine (ADRENALIN) 1 MG/ML  injection Inject 1 mg as directed as needed for anaphylaxis. Reported on 02/01/2016    . ezetimibe (ZETIA) 10 MG tablet Take 1 tablet (10 mg total) by mouth daily. 30 tablet 6  . Lysine 500 MG CAPS Take 1 capsule by mouth 2 (two) times daily as needed (fever blisters).     . meclizine (ANTIVERT) 25 MG tablet Take 25 mg by mouth daily as needed for dizziness.     . Multiple Vitamins-Minerals (CENTRUM SILVER 50+WOMEN PO) Take 1 tablet by mouth daily.    . nitroGLYCERIN (NITROSTAT) 0.4 MG SL tablet Place 0.4 mg under the tongue every 5 (five) minutes as needed for chest pain. Reported on 02/01/2016    . Omega 3 1000 MG CAPS Take 1 each by mouth daily.     Marland Kitchen omeprazole (PRILOSEC) 40 MG capsule Take 40 mg by mouth daily.    . Prenatal Vit-Fe Fumarate-FA (PRENATAL VITAMIN PO) Take 1 tablet by mouth 2 (two) times daily.    . psyllium (METAMUCIL) 58.6 % powder Take 1 packet by mouth daily.    . rosuvastatin (CRESTOR) 10 MG tablet Take 10 mg by mouth daily.  3  . sotalol (BETAPACE) 160 MG tablet TAKE 1 TABLET (160 MG TOTAL) BY MOUTH 2 (TWO) TIMES DAILY. 60 tablet 10   No current facility-administered medications for this visit.      REVIEW OF SYSTEMS:   Constitutional: Denies fevers, chills or night sweats Eyes: Denies blurriness of vision Ears, nose, mouth, throat, and face: Denies mucositis or sore throat Respiratory: Denies cough, dyspnea or wheezes Cardiovascular: Denies palpitation, chest discomfort or lower extremity swelling Gastrointestinal:  Denies nausea, heartburn or change in bowel habits Skin: Denies abnormal  skin rashes Lymphatics: Denies new lymphadenopathy or easy bruising Neurological:Denies numbness, tingling or new weaknesses Behavioral/Psych: Mood is stable, no new changes  All other systems were reviewed with the patient and are negative.  PHYSICAL EXAMINATION: ECOG PERFORMANCE STATUS: 0 - Asymptomatic  Vitals:   05/05/17 0906  BP: (!) 123/56  Pulse: (!) 53  Resp: 18   Temp: 98 F (36.7 C)  SpO2: 98%   Filed Weights   05/05/17 0906  Weight: 135 lb 9.6 oz (61.5 kg)    GENERAL:alert, no distress and comfortable SKIN: skin color, texture, turgor are normal, no rashes or significant lesions EYES: normal, Conjunctiva are pink and non-injected, sclera clear NEURO: alert & oriented x 3 with fluent speech, no focal motor/sensory deficits  LABORATORY DATA:  I have reviewed the data as listed     Component Value Date/Time   NA 142 05/05/2017 0850   K 4.0 05/05/2017 0850   CL 99 09/29/2016 0915   CL 104 08/02/2012 1515   CO2 25 05/05/2017 0850   GLUCOSE 130 05/05/2017 0850   GLUCOSE 105 (H) 08/02/2012 1515   BUN 18.9 05/05/2017 0850   CREATININE 0.9 05/05/2017 0850   CALCIUM 10.0 05/05/2017 0850   PROT 6.9 05/05/2017 0850   ALBUMIN 3.8 05/05/2017 0850   AST 19 05/05/2017 0850   ALT 14 05/05/2017 0850   ALKPHOS 54 05/05/2017 0850   BILITOT 0.48 05/05/2017 0850   GFRNONAA 57 (L) 09/29/2016 0915   GFRAA 66 09/29/2016 0915    No results found for: SPEP, UPEP  Lab Results  Component Value Date   WBC 6.3 05/05/2017   NEUTROABS 3.2 05/05/2017   HGB 13.0 05/05/2017   HCT 38.8 05/05/2017   MCV 85.5 05/05/2017   PLT 39 (L) 05/05/2017      Chemistry      Component Value Date/Time   NA 142 05/05/2017 0850   K 4.0 05/05/2017 0850   CL 99 09/29/2016 0915   CL 104 08/02/2012 1515   CO2 25 05/05/2017 0850   BUN 18.9 05/05/2017 0850   CREATININE 0.9 05/05/2017 0850      Component Value Date/Time   CALCIUM 10.0 05/05/2017 0850   ALKPHOS 54 05/05/2017 0850   AST 19 05/05/2017 0850   ALT 14 05/05/2017 0850   BILITOT 0.48 05/05/2017 0850     ASSESSMENT & PLAN:  Thrombocytopenia The most likely cause of her low platelet count is acute on chronic ITP We discussed potential further workup in the near future Previously, she responded well to prednisone but could not tolerate chronic prednisone therapy Compliance is very poor I have long  discussion with the patient and her husband related to pathophysiology of chronic ITP I am reluctant to recommend prednisone therapy again According to the patient, she is allergic to "everything" and she will likely have side effects on "everything" I recommend IVIG treatment. The risks, benefits, side effects of IVIG is discussed and she agreed to proceed I will see her back next week to assess response to treatment If treatment works, she may need long-term venous access  Poor venous access She has poor venous access I recommend trial of IVIG If it works, she may need a port placement long-term   No orders of the defined types were placed in this encounter.   All questions were answered. The patient knows to call the clinic with any problems, questions or concerns. No barriers to learning was detected.  I spent 10 minutes counseling the patient face to  face. The total time spent in the appointment was 15 minutes and more than 50% was on counseling.     Heath Lark, MD 8/17/20189:46 AM

## 2017-05-05 NOTE — Assessment & Plan Note (Signed)
She has poor venous access I recommend trial of IVIG If it works, she may need a port placement long-term

## 2017-05-05 NOTE — Telephone Encounter (Signed)
Called patient back. She said she is starting to feel better. She took tylenol 15 minutes ago. She said it scared her when she got home and was hurting. She thinks all the sitting around today did not help. Instructed patient per Dr. Alen Blew that she can take tylenol for the pain. Patient states that she does not have any Advil. Instructed patient to do to ER for worsening symptoms. Verbalized understanding.

## 2017-05-05 NOTE — Telephone Encounter (Signed)
Scheduled appt per 8/17 los - Gave patient AVS and calender per los  

## 2017-05-08 ENCOUNTER — Ambulatory Visit (INDEPENDENT_AMBULATORY_CARE_PROVIDER_SITE_OTHER): Payer: Medicare Other | Admitting: Diagnostic Neuroimaging

## 2017-05-08 ENCOUNTER — Encounter: Payer: Self-pay | Admitting: Diagnostic Neuroimaging

## 2017-05-08 VITALS — BP 135/71 | HR 58 | Ht 62.0 in | Wt 135.6 lb

## 2017-05-08 DIAGNOSIS — F03A0 Unspecified dementia, mild, without behavioral disturbance, psychotic disturbance, mood disturbance, and anxiety: Secondary | ICD-10-CM

## 2017-05-08 DIAGNOSIS — F0390 Unspecified dementia without behavioral disturbance: Secondary | ICD-10-CM | POA: Diagnosis not present

## 2017-05-08 DIAGNOSIS — R413 Other amnesia: Secondary | ICD-10-CM | POA: Diagnosis not present

## 2017-05-08 DIAGNOSIS — I616 Nontraumatic intracerebral hemorrhage, multiple localized: Secondary | ICD-10-CM | POA: Diagnosis not present

## 2017-05-08 NOTE — Progress Notes (Signed)
[facility]  PATIENT: RAYEN PALEN DOB: 19-Apr-1933  REFERRING CLINICIAN: Cipriano Mile, MD HISTORY FROM: patient and husband  REASON FOR VISIT: new consult    HISTORICAL  CHIEF COMPLAINT:  Chief Complaint  Patient presents with  . Dizziness    rm 6, New Pt, husband- Ed, "haven't had dizziness/vertigo x 5 days; concerned about my memory"  MMSE 19"   . Memory Loss    HISTORY OF PRESENT ILLNESS:   83 year old female with history of thrombocytopenia, mitral valve repair, heart stent, here for evaluation of memory loss. I previously evaluated patient in 2014 for vertigo and dizziness. Now patient referred here for memory loss.  In July 2018 patient was hospitalized for vertigo. Since that time patient has had increasing problems with short-term memory loss and confusion. She has difficult time with retaining new information.  In addition patient has had some memory loss dating back to 2010 when she had heart surgery and was diagnosed with hemorrhagic infarcts in the brain.  Patient has declined in her ability to prepare complex meals and cooking home. She does not drive. She does not take her finances. She is able to take her own personal hygiene issues.   REVIEW OF SYSTEMS: Full 14 system review of systems performed and negative with exception of: Memory loss confusion not asleep.  ALLERGIES: Allergies  Allergen Reactions  . Bee Venom Anaphylaxis  . Iodides Anaphylaxis  . Lyrica [Pregabalin]   . Codeine Other (See Comments)    Reaction: hallucinate  . Demerol [Meperidine] Hives and Other (See Comments)    REACTION: Welts  . Ambien [Zolpidem Tartrate] Other (See Comments)    Altered Mental Status  . Aspirin Other (See Comments)    Bleeding, aneurysm- when she was on plavix Bleeding, aneurysm- when she was on plavix  . Celebrex [Celecoxib] Other (See Comments)    Legs hurt  . Darvocet [Propoxyphene N-Acetaminophen] Other (See Comments)    Hallucinates   . Fosamax [Alendronate Sodium] Other (See Comments)    Legs hurt  . Lipitor [Atorvastatin] Other (See Comments)    Legs hurt  . Morphine And Related Other (See Comments)    Hallucinations  . Valium [Diazepam] Other (See Comments)    Altered Mental Status  . Iodine Hives    Welts    HOME MEDICATIONS: Outpatient Medications Prior to Visit  Medication Sig Dispense Refill  . calcium citrate (CALCITRATE - DOSED IN MG ELEMENTAL CALCIUM) 950 MG tablet Take 1 tablet by mouth daily.    . cetirizine (ZYRTEC) 10 MG tablet Take 10 mg by mouth daily.    . Cholecalciferol (VITAMIN D-3 PO) Take 1 tablet by mouth daily.    Marland Kitchen EPINEPHrine (ADRENALIN) 1 MG/ML injection Inject 1 mg as directed as needed for anaphylaxis. Reported on 02/01/2016    . ezetimibe (ZETIA) 10 MG tablet Take 1 tablet (10 mg total) by mouth daily. 30 tablet 6  . Lysine 500 MG CAPS Take 1 capsule by mouth 2 (two) times daily as needed (fever blisters).     . meclizine (ANTIVERT) 25 MG tablet Take 25 mg by mouth daily as needed for dizziness.     . Multiple Vitamins-Minerals (CENTRUM SILVER 50+WOMEN PO) Take 1 tablet by mouth daily.    . nitroGLYCERIN (NITROSTAT) 0.4 MG SL tablet Place 0.4 mg under the tongue every 5 (five) minutes as needed for chest pain. Reported on 02/01/2016    . Omega 3 1000 MG CAPS Take 1 each by mouth daily.     Marland Kitchen  omeprazole (PRILOSEC) 40 MG capsule Take 40 mg by mouth daily.    . Prenatal Vit-Fe Fumarate-FA (PRENATAL VITAMIN PO) Take 1 tablet by mouth 2 (two) times daily.    . psyllium (METAMUCIL) 58.6 % powder Take 1 packet by mouth daily.    . rosuvastatin (CRESTOR) 10 MG tablet Take 10 mg by mouth daily.  3  . sotalol (BETAPACE) 160 MG tablet TAKE 1 TABLET (160 MG TOTAL) BY MOUTH 2 (TWO) TIMES DAILY. 60 tablet 10  . amLODipine (NORVASC) 5 MG tablet Take 1 tablet (5 mg total) by mouth daily. 90 tablet 3   No facility-administered medications prior to visit.     PAST MEDICAL HISTORY: Past Medical  History:  Diagnosis Date  . Bilateral hearing loss   . CAD (coronary artery disease) 2003   on Plavix.   . CVA (cerebral vascular accident) ([place]) 2010  . Fatigue   . Gross hematuria 07/03/2015  . Hemorrhagic stroke ([place]) 2010  . Hiatal hernia   . Hypercholesteremia   . ITP (idiopathic thrombocytopenic purpura) 01/04/2016  . Kidney stones   . Osteoporosis   . Pancreatitis 1970  . Pancreatitis   . Thrombocytopenia ([place])   . Unspecified deficiency anemia     PAST SURGICAL HISTORY: Past Surgical History:  Procedure Laterality Date  . APPENDECTOMY    . CARDIAC VALVE SURGERY  2010  . CATARACT EXTRACTION  2017   x 2  . COLONOSCOPY     [facility], Dr. Wynetta Emery  . COLONOSCOPY WITH PROPOFOL N/A 04/09/2013   Procedure: COLONOSCOPY WITH PROPOFOL;  Surgeon: Garlan Fair, MD;  Location: [facility];  Service: Endoscopy;  Laterality: N/A;  . CORONARY ARTERY BYPASS GRAFT  06/2009  . FLEXIBLE SIGMOIDOSCOPY N/A 08/03/2015   Procedure: FLEXIBLE SIGMOIDOSCOPY;  Surgeon: Garlan Fair, MD;  Location: [facility];  Service: Endoscopy;  Laterality: N/A;  . TUBAL LIGATION      FAMILY HISTORY: Family History  Problem Relation Age of Onset  . Cancer Mother 28       colon  . Heart attack Father   . Cancer Sister        colon, cervical  . Stroke Sister   . Memory loss Sister   . Cancer Brother        lung  . Dementia Brother     SOCIAL HISTORY:  Social History   Social History  . Marital status: Married    Spouse name: Ed  . Number of children: 2  . Years of education: College   Occupational History  .      retired Estate manager/land agent   Social History Main Topics  . Smoking status: Never Smoker  . Smokeless tobacco: Never Used  . Alcohol use No  . Drug use: No  . Sexual activity: Not on file   Other Topics Concern  . Not on file   Social History Narrative   Pt lives at home with spouse.   Caffeine Use: Rarely     PHYSICAL EXAM  GENERAL  EXAM/CONSTITUTIONAL: Vitals:  Vitals:   05/08/17 1448  BP: 135/71  Pulse: (!) 58  Weight: 135 lb 9.6 oz (61.5 kg)  Height: 5\' 2"  (1.575 m)     Body mass index is 24.8 kg/m.  Visual Acuity Screening   Right eye Left eye Both eyes  Without correction:     With correction: 20/40 20/40   Comments: 05/08/17 bifocals    Patient is in no distress; well developed, nourished and groomed; neck  is supple  CARDIOVASCULAR:  Examination of carotid arteries is normal; no carotid bruits  Regular rate and rhythm, no murmurs  Examination of peripheral vascular system by observation and palpation is normal  EYES:  Ophthalmoscopic exam of optic discs and posterior segments is normal; no papilledema or hemorrhages  MUSCULOSKELETAL:  Gait, strength, tone, movements noted in Neurologic exam below  NEUROLOGIC: MENTAL STATUS:  MMSE - Mini Mental State Exam 05/08/2017  Orientation to time 4  Orientation to Place 4  Registration 3  Attention/ Calculation 1  Recall 0  Language- name 2 objects 2  Language- repeat 0  Language- follow 3 step command 3  Language- read & follow direction 1  Write a sentence 1  Copy design 0  Total score 19    awake, alert, oriented to person, place and time  recent and remote memory intact  normal attention and concentration  language fluent, comprehension intact, naming intact,   fund of knowledge appropriate  CRANIAL NERVE:   2nd - no papilledema on fundoscopic exam  2nd, 3rd, 4th, 6th - pupils equal and reactive to light, visual fields full to confrontation, extraocular muscles intact, no nystagmus  5th - facial sensation symmetric  7th - facial strength symmetric  8th - hearing intact  9th - palate elevates symmetrically, uvula midline  11th - shoulder shrug symmetric  12th - tongue protrusion midline  MOTOR:   normal bulk and tone, full strength in the BUE, BLE  SENSORY:   normal and symmetric to light touch, temperature,  vibration  COORDINATION:   finger-nose-finger, fine finger movements normal  REFLEXES:   deep tendon reflexes present and symmetric  GAIT/STATION:   narrow based gait    DIAGNOSTIC DATA (LABS, IMAGING, TESTING) - I reviewed patient records, labs, notes, testing and imaging myself where available.  Lab Results  Component Value Date   WBC 6.3 05/05/2017   HGB 13.0 05/05/2017   HCT 38.8 05/05/2017   MCV 85.5 05/05/2017   PLT 39 (L) 05/05/2017      Component Value Date/Time   NA 142 05/05/2017 0850   K 4.0 05/05/2017 0850   CL 99 09/29/2016 0915   CL 104 08/02/2012 1515   CO2 25 05/05/2017 0850   GLUCOSE 130 05/05/2017 0850   GLUCOSE 105 (H) 08/02/2012 1515   BUN 18.9 05/05/2017 0850   CREATININE 0.9 05/05/2017 0850   CALCIUM 10.0 05/05/2017 0850   PROT 6.9 05/05/2017 0850   ALBUMIN 3.8 05/05/2017 0850   AST 19 05/05/2017 0850   ALT 14 05/05/2017 0850   ALKPHOS 54 05/05/2017 0850   BILITOT 0.48 05/05/2017 0850   GFRNONAA 57 (L) 09/29/2016 0915   GFRAA 66 09/29/2016 0915   Lab Results  Component Value Date   CHOL 125 09/29/2016   HDL 46 09/29/2016   LDLCALC 50 09/29/2016   TRIG 143 09/29/2016   CHOLHDL 2.7 09/29/2016   No results found for: HGBA1C Lab Results  Component Value Date   VITAMINB12 800 01/11/2016   Lab Results  Component Value Date   TSH 1.638 08/02/2012    08/26/09 MRI brain 1. Evolving intraparenchymal hemorrhages within the right frontal, posterior parietal, temporal, and left occipital lobes. 2. No significant stenoses or aneurysm of the anterior or posterior intracranial circulation.  09/08/09 MRI brain ([facility] only) - Multiple tiny foci of blooming throughout the brain are suggestive of amyloid angiopathy. Larger regions of blooming bilaterally in the regions of prior intraparenchymal hemorrhage.  02/14/13 MRI brain  [I reviewed images  myself and agree with interpretation. -[facility]P]  Abnormal MRI brain and IAC (with and without)  demonstrating: 1. Chronic right anterior temporal and left occipital hemorrhagic infarctions. 2. Mild periventricular and subcortical chronic small vessel ischemic disease. 3. No abnormal enhancing or compressive lesions. Internal auditory canal (IAC) protocol sequences unremarkable. 4. No acute findings.     ASSESSMENT AND PLAN  83 y.o. year old female here with history of multiple intracerebral hemorrhages, possible amyloid angiopathy, now with progressive short-term memory loss and confusion. Signs and symptoms are suspicious for underlying neurodegenerative disorder. Also with intermittent vertigo and dizziness in the past, which may be related to peripheral vestibulopathy.   Dx: mild dementia (? Alzheimers) + chronic cerebral hemorrhages (? Amyloid angiopathy)  1. Mild dementia   2. Memory loss   3. Nontraumatic multiple localized intracerebral hemorrhages, unspecified laterality ([place])      PLAN: - repeat MRI brain  - increase safety and supervision - will provide community resources - consider donepezil or memantine  Orders Placed This Encounter  Procedures  . MR BRAIN WO CONTRAST   Return in about 4 months (around 09/07/2017).    Penni Bombard, MD 7/97/2820, 6:01 PM Certified in Neurology, Neurophysiology and Neuroimaging  [facility] [address] [place], [place] [address] [phone]

## 2017-05-10 ENCOUNTER — Other Ambulatory Visit: Payer: Self-pay | Admitting: Hematology and Oncology

## 2017-05-10 DIAGNOSIS — D693 Immune thrombocytopenic purpura: Secondary | ICD-10-CM

## 2017-05-11 ENCOUNTER — Telehealth: Payer: Self-pay | Admitting: Hematology and Oncology

## 2017-05-11 ENCOUNTER — Other Ambulatory Visit (HOSPITAL_BASED_OUTPATIENT_CLINIC_OR_DEPARTMENT_OTHER): Payer: Medicare Other

## 2017-05-11 ENCOUNTER — Ambulatory Visit (HOSPITAL_BASED_OUTPATIENT_CLINIC_OR_DEPARTMENT_OTHER): Payer: Medicare Other | Admitting: Hematology and Oncology

## 2017-05-11 ENCOUNTER — Encounter: Payer: Self-pay | Admitting: Hematology and Oncology

## 2017-05-11 DIAGNOSIS — D693 Immune thrombocytopenic purpura: Secondary | ICD-10-CM

## 2017-05-11 DIAGNOSIS — I878 Other specified disorders of veins: Secondary | ICD-10-CM | POA: Diagnosis not present

## 2017-05-11 DIAGNOSIS — D696 Thrombocytopenia, unspecified: Secondary | ICD-10-CM

## 2017-05-11 LAB — CBC WITH DIFFERENTIAL/PLATELET
BASO%: 0.7 % (ref 0.0–2.0)
Basophils Absolute: 0 10*3/uL (ref 0.0–0.1)
EOS ABS: 0 10*3/uL (ref 0.0–0.5)
EOS%: 0.7 % (ref 0.0–7.0)
HCT: 37.5 % (ref 34.8–46.6)
HGB: 12.6 g/dL (ref 11.6–15.9)
LYMPH#: 2.1 10*3/uL (ref 0.9–3.3)
LYMPH%: 38.8 % (ref 14.0–49.7)
MCH: 28.8 pg (ref 25.1–34.0)
MCHC: 33.5 g/dL (ref 31.5–36.0)
MCV: 85.8 fL (ref 79.5–101.0)
MONO#: 0.7 10*3/uL (ref 0.1–0.9)
MONO%: 13.5 % (ref 0.0–14.0)
NEUT%: 46.3 % (ref 38.4–76.8)
NEUTROS ABS: 2.5 10*3/uL (ref 1.5–6.5)
Platelets: 261 10*3/uL (ref 145–400)
RBC: 4.38 10*6/uL (ref 3.70–5.45)
RDW: 14.4 % (ref 11.2–14.5)
WBC: 5.4 10*3/uL (ref 3.9–10.3)

## 2017-05-11 NOTE — Assessment & Plan Note (Signed)
The patient has complete response to IVIG She stated that she had "terrible" side effects with diffuse joint pain that she screamed and cried after leaving the cancer center and followed that she would never want to be treated with the same treatments However, upon review of test results which showed she had complete response to treatment, the patient changed her mind and felt that she is willing to pursue treatment again once a months and to take Tylenol as needed if she have symptoms

## 2017-05-11 NOTE — Assessment & Plan Note (Signed)
She has extensive bruises and poor venous access If we confirm she will need long-term once a month IVIG treatments, I would recommend having a port put in.

## 2017-05-11 NOTE — Telephone Encounter (Signed)
Gave pt avs and calendar for upcoming appts.  °

## 2017-05-11 NOTE — Progress Notes (Signed)
[facility] OFFICE PROGRESS NOTE  Adrienne Ada, MD SUMMARY OF HEMATOLOGIC HISTORY:  This is a pleasant lady with chronic thrombocytopenia. The patient was discovered to have low platelet count when she was placed on Plavix for coronary artery disease. 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 She is being observed. On 01/04/2016, she was started on prednisone therapy for ITP when her platelet count dip under  50,000 May 2017, she tolerated prednisone poorly and prednisone is slowly tapered off 05/05/2017, she had relapsed ITP and started on IVIG with complete response to Rx INTERVAL HISTORY: [name] 83 y.o. female returns for further follow-up. She complained of bruising With last IVIG treatment, there is some minor problem with IV access issue She stated she felt "terrible" after IVIG When she got home, she started to develop significant, diffuse bone pain to the point that she cried She subsequently took some Tylenol and her symptoms resolved She is afraid to go back on IVIG treatment because of her adverse side effects  I have reviewed the past medical history, past surgical history, social history and family history with the patient and they are unchanged from previous note.  ALLERGIES:  is allergic to bee venom; iodides; lyrica [pregabalin]; codeine; demerol [meperidine]; ambien [zolpidem tartrate]; aspirin; celebrex [celecoxib]; darvocet [propoxyphene n-acetaminophen]; fosamax [alendronate sodium]; lipitor [atorvastatin]; morphine and related; valium [diazepam]; and iodine.  MEDICATIONS:  Current Outpatient Prescriptions  Medication Sig Dispense Refill  . amLODipine (NORVASC) 5 MG tablet Take 1 tablet (5 mg total) by mouth daily. 90 tablet 3  . calcium citrate (CALCITRATE - DOSED IN MG  ELEMENTAL CALCIUM) 950 MG tablet Take 1 tablet by mouth daily.    . cetirizine (ZYRTEC) 10 MG tablet Take 10 mg by mouth daily.    . Cholecalciferol (VITAMIN D-3 PO) Take 1 tablet by mouth daily.    Marland Kitchen EPINEPHrine (ADRENALIN) 1 MG/ML injection Inject 1 mg as directed as needed for anaphylaxis. Reported on 02/01/2016    . ezetimibe (ZETIA) 10 MG tablet Take 1 tablet (10 mg total) by mouth daily. 30 tablet 6  . Lysine 500 MG CAPS Take 1 capsule by mouth 2 (two) times daily as needed (fever blisters).     . meclizine (ANTIVERT) 25 MG tablet Take 25 mg by mouth daily as needed for dizziness.     . Multiple Vitamins-Minerals (CENTRUM SILVER 50+WOMEN PO) Take 1 tablet by mouth daily.    . nitroGLYCERIN (NITROSTAT) 0.4 MG SL tablet Place 0.4 mg under the tongue every 5 (five) minutes as needed for chest pain. Reported on 02/01/2016    . Omega 3 1000 MG CAPS Take 1 each by mouth daily.     Marland Kitchen omeprazole (PRILOSEC) 40 MG capsule Take 40 mg by mouth daily.    . Prenatal Vit-Fe Fumarate-FA (PRENATAL VITAMIN PO) Take 1 tablet by mouth 2 (two) times daily.    . psyllium (METAMUCIL) 58.6 % powder Take 1 packet by mouth daily.    . rosuvastatin (CRESTOR) 10 MG tablet Take 10 mg by mouth daily.  3  . sotalol (BETAPACE) 160 MG tablet TAKE 1 TABLET (160 MG TOTAL) BY MOUTH 2 (TWO) TIMES DAILY. 60 tablet 10   No current facility-administered medications for this visit.      REVIEW OF SYSTEMS:   Constitutional: Denies fevers, chills or night sweats Eyes: Denies  blurriness of vision Ears, nose, mouth, throat, and face: Denies mucositis or sore throat Respiratory: Denies cough, dyspnea or wheezes Cardiovascular: Denies palpitation, chest discomfort or lower extremity swelling Gastrointestinal:  Denies nausea, heartburn or change in bowel habits Skin: Denies abnormal skin rashes Lymphatics: Denies new lymphadenopathy Neurological:Denies numbness, tingling or new weaknesses Behavioral/Psych: Mood is stable, no new  changes  All other systems were reviewed with the patient and are negative.  PHYSICAL EXAMINATION: ECOG PERFORMANCE STATUS: 1 - Symptomatic but completely ambulatory  Vitals:   05/11/17 0909  BP: 136/60  Pulse: (!) 49  Resp: 17  Temp: 98.1 F (36.7 C)  SpO2: 100%   Filed Weights   05/11/17 0909  Weight: 134 lb 3.2 oz (60.9 kg)    GENERAL:alert, no distress and comfortable SKIN: skin color, texture, turgor are normal, no rashes or significant lesions EYES: normal, Conjunctiva are pink and non-injected, sclera clear Musculoskeletal:no cyanosis of digits and no clubbing  NEURO: alert & oriented x 3 with fluent speech, no focal motor/sensory deficits  LABORATORY DATA:  I have reviewed the data as listed     Component Value Date/Time   NA 142 05/05/2017 0850   K 4.0 05/05/2017 0850   CL 99 09/29/2016 0915   CL 104 08/02/2012 1515   CO2 25 05/05/2017 0850   GLUCOSE 130 05/05/2017 0850   GLUCOSE 105 (H) 08/02/2012 1515   BUN 18.9 05/05/2017 0850   CREATININE 0.9 05/05/2017 0850   CALCIUM 10.0 05/05/2017 0850   PROT 6.9 05/05/2017 0850   ALBUMIN 3.8 05/05/2017 0850   AST 19 05/05/2017 0850   ALT 14 05/05/2017 0850   ALKPHOS 54 05/05/2017 0850   BILITOT 0.48 05/05/2017 0850   GFRNONAA 57 (L) 09/29/2016 0915   GFRAA 66 09/29/2016 0915    No results found for: SPEP, UPEP  Lab Results  Component Value Date   WBC 5.4 05/11/2017   NEUTROABS 2.5 05/11/2017   HGB 12.6 05/11/2017   HCT 37.5 05/11/2017   MCV 85.8 05/11/2017   PLT 261 05/11/2017      Chemistry      Component Value Date/Time   NA 142 05/05/2017 0850   K 4.0 05/05/2017 0850   CL 99 09/29/2016 0915   CL 104 08/02/2012 1515   CO2 25 05/05/2017 0850   BUN 18.9 05/05/2017 0850   CREATININE 0.9 05/05/2017 0850      Component Value Date/Time   CALCIUM 10.0 05/05/2017 0850   ALKPHOS 54 05/05/2017 0850   AST 19 05/05/2017 0850   ALT 14 05/05/2017 0850   BILITOT 0.48 05/05/2017 0850     ASSESSMENT  & PLAN:  Thrombocytopenia The patient has complete response to IVIG She stated that she had "terrible" side effects with diffuse joint pain that she screamed and cried after leaving the cancer center and followed that she would never want to be treated with the same treatments However, upon review of test results which showed she had complete response to treatment, the patient changed her mind and felt that she is willing to pursue treatment again once a months and to take Tylenol as needed if she have symptoms  Poor venous access She has extensive bruises and poor venous access If we confirm she will need long-term once a month IVIG treatments, I would recommend having a port put in.   No orders of the defined types were placed in this encounter.   All questions were answered. The patient knows to call the clinic with any  problems, questions or concerns. No barriers to learning was detected.  I spent 10 minutes counseling the patient face to face. The total time spent in the appointment was 15 minutes and more than 50% was on counseling.     Heath Lark, MD 8/23/201811:22 AM

## 2017-05-18 ENCOUNTER — Ambulatory Visit: Payer: Medicare Other | Attending: Otolaryngology | Admitting: Rehabilitative and Restorative Service Providers"

## 2017-05-18 DIAGNOSIS — H8112 Benign paroxysmal vertigo, left ear: Secondary | ICD-10-CM | POA: Diagnosis present

## 2017-05-18 NOTE — Therapy (Signed)
[facility] [address] [place], [place], [address] Phone: [phone]   Fax:  [phone]  Physical Therapy Evaluation  Patient Details  Name: [name] MRN: [id number] Date of Birth: 1933-06-10 Referring Provider: Izora Gala, MD  Encounter Date: 05/18/2017      PT End of Session - 05/18/17 1051    Visit Number 1   Number of Visits 1   Authorization Type [employer] medicare   PT Start Time 1015   PT Stop Time 1045   PT Time Calculation (min) 30 min   Activity Tolerance Patient tolerated treatment well   Behavior During Therapy [facility] for tasks assessed/performed      Past Medical History:  Diagnosis Date  . Bilateral hearing loss   . CAD (coronary artery disease) 2003   on Plavix.   . CVA (cerebral vascular accident) ([place]) 2010  . Fatigue   . Gross hematuria 07/03/2015  . Hemorrhagic stroke ([place]) 2010  . Hiatal hernia   . Hypercholesteremia   . ITP (idiopathic thrombocytopenic purpura) 01/04/2016  . Kidney stones   . Osteoporosis   . Pancreatitis 1970  . Pancreatitis   . Thrombocytopenia ([place])   . Unspecified deficiency anemia     Past Surgical History:  Procedure Laterality Date  . APPENDECTOMY    . CARDIAC VALVE SURGERY  2010  . CATARACT EXTRACTION  2017   x 2  . COLONOSCOPY     [facility], Dr. Wynetta Emery  . COLONOSCOPY WITH PROPOFOL N/A 04/09/2013   Procedure: COLONOSCOPY WITH PROPOFOL;  Surgeon: Garlan Fair, MD;  Location: [facility] ENDOSCOPY;  Service: Endoscopy;  Laterality: N/A;  . CORONARY ARTERY BYPASS GRAFT  06/2009  . FLEXIBLE SIGMOIDOSCOPY N/A 08/03/2015   Procedure: FLEXIBLE SIGMOIDOSCOPY;  Surgeon: Garlan Fair, MD;  Location: [facility];  Service: Endoscopy;  Laterality: N/A;  . TUBAL LIGATION      There were no vitals filed for this visit.       Subjective Assessment - 05/18/17 1021    Subjective The patient notes history of intermittent vertigo for possibly up to 10 years,  but never this severe or that lasts this long.  She notes that meclizine initially made symptoms worse.  Since undergoing assessment at ENT, she feels symptoms are significantly improved.  She is no longer taking meclizine.   She is avoiding rolling to the left at this time.     Pertinent History congenital L ear diminished hearing, recent memory changes, cerebral hemorrhage 2010 with abnormal peripheral vision,   Patient Stated Goals Reduce dizziness.   Currently in Pain? Yes  low back with activity--PT not to address due to nature of referral--will monitor during treatment.            [facility] PT Assessment - 05/18/17 1025      Assessment   Medical Diagnosis BPPV, left   Referring Provider Izora Gala, MD   Onset Date/Surgical Date --  July, 2018   Prior Therapy none     Precautions   Precautions Fall     Restrictions   Weight Bearing Restrictions No     Balance Screen   Has the patient fallen in the past 6 months Yes   How many times? 19 August 2016   Has the patient had a decrease in activity level because of a fear of falling?  No   Is the patient reluctant to leave their home because of a fear of falling?  No  Home Environment   Living Environment Private residence   Living Arrangements Spouse/significant other   Type of [place] to enter   Home Layout Two level   Alternate Level Stairs-Number of Steps 13     Prior Function   Level of Independence Independent with basic ADLs;Independent with household mobility without device;Independent with community mobility without device  does not drive and has memory issues since 2010, worsening     Cognition   Overall Cognitive Status History of cognitive impairments - at baseline            Vestibular Assessment - 05-20-2017 1028      Vestibular Assessment   General Observation Walks independently into clinic without device     Symptom Behavior   Type of Dizziness Spinning   Frequency of  Dizziness daily   Duration of Dizziness seconds to minutes when rolling to the left   Aggravating Factors --  rolling to left   Relieving Factors Head stationary  avoiding rolling to the left     Occulomotor Exam   Occulomotor Alignment Normal   Spontaneous Absent   Gaze-induced Absent   Smooth Pursuits Saccades   Saccades Intact     Vestibulo-Occular Reflex   VOR 1 Head Only (x 1 viewing) self regulated pace does not provoke symptoms   Comment Head impulse test positive bilaterally for refixation saccade, however patient had difficulty following instruction for assessment.     Positional Testing   Dix-Hallpike Dix-Hallpike Right;Dix-Hallpike Left   Horizontal Canal Testing Horizontal Canal Right;Horizontal Canal Left     Dix-Hallpike Right   Dix-Hallpike Right Duration Performed all positional testing in room light and with frenzels donned.  None   Dix-Hallpike Right Symptoms No nystagmus     Dix-Hallpike Left   Dix-Hallpike Left Duration none   Dix-Hallpike Left Symptoms No nystagmus     Horizontal Canal Right   Horizontal Canal Right Duration none   Horizontal Canal Right Symptoms Normal     Horizontal Canal Left   Horizontal Canal Left Duration none   Horizontal Canal Left Symptoms Normal        Objective measurements completed on examination: See above findings.                               Plan - 05-20-2017 1051    Clinical Impression Statement The patient presents to PT today reporting significant improvement in symptoms since seeing ENT earlier this month.  She is negative today for all positional testing.  PT encouraged patient and her husband to return to moving to the left side to reduce behavioral compensatory strategies as vertigo appears cleared today (sounds like positional assessment may have repositioned otoconia at ENT office).    Clinical Presentation Stable   Clinical Decision Making Low   PT Next Visit Plan Eval only due to  no symptoms provoked today.  Patient to call PT if symptoms change.   Consulted and Agree with Plan of Care Patient      Patient will benefit from skilled therapeutic intervention in order to improve the following deficits and impairments:  Dizziness (not provoked during evaluation)  Visit Diagnosis: BPPV (benign paroxysmal positional vertigo), left      G-Codes - 05-20-2017 1054    Functional Assessment Tool Used (Outpatient Only) recent vertigo--patient has returned to baseline level of function   Functional Limitation Self care   Self Care Current Status (  G8987) 0 percent impaired, limited or restricted   Self Care Goal Status (H9622) 0 percent impaired, limited or restricted   Self Care Discharge Status [phone]) 0 percent impaired, limited or restricted       Problem List Patient Active Problem List   Diagnosis Date Noted  . Poor venous access 05/05/2017  . Chronic ITP (idiopathic thrombocytopenia) (HCC) 05/02/2017  . Hyperlipidemia LDL goal <70 09/29/2016  . Idiopathic thrombocytopenic purpura ([place]) 01/04/2016  . S/P mitral valve repair 04/26/2015  . Hypertrophic obstructive cardiomyopathy ([place]) 04/08/2014  . CVA (cerebral vascular accident) ([place]) 04/08/2014  . Paroxysmal atrial fibrillation ([place]) 04/08/2014  . Thrombocytopenia ([place])   . Other fatigue   . Unspecified deficiency anemia     Keaundre Thelin, PT 05/18/2017, 10:55 AM  [facility] [address] [place], [place], [address] Phone: [phone]   Fax:  [phone]  Name: NEHEMIAH MONTEE MRN: [id number] Date of Birth: 12-Apr-1933

## 2017-05-19 ENCOUNTER — Ambulatory Visit
Admission: RE | Admit: 2017-05-19 | Discharge: 2017-05-19 | Disposition: A | Discharge status: Home / Self Care | Payer: Medicare Other | Source: Ambulatory Visit | Attending: Diagnostic Neuroimaging | Admitting: Diagnostic Neuroimaging

## 2017-05-19 DIAGNOSIS — R413 Other amnesia: Secondary | ICD-10-CM

## 2017-06-02 ENCOUNTER — Ambulatory Visit (HOSPITAL_BASED_OUTPATIENT_CLINIC_OR_DEPARTMENT_OTHER): Payer: Medicare Other

## 2017-06-02 ENCOUNTER — Ambulatory Visit (HOSPITAL_BASED_OUTPATIENT_CLINIC_OR_DEPARTMENT_OTHER): Payer: Medicare Other | Admitting: Hematology and Oncology

## 2017-06-02 ENCOUNTER — Other Ambulatory Visit (HOSPITAL_BASED_OUTPATIENT_CLINIC_OR_DEPARTMENT_OTHER): Payer: Medicare Other

## 2017-06-02 ENCOUNTER — Telehealth: Payer: Self-pay | Admitting: Hematology and Oncology

## 2017-06-02 ENCOUNTER — Encounter: Payer: Self-pay | Admitting: Hematology and Oncology

## 2017-06-02 VITALS — BP 152/58 | HR 55 | Temp 97.9°F | Resp 18

## 2017-06-02 DIAGNOSIS — I878 Other specified disorders of veins: Secondary | ICD-10-CM | POA: Diagnosis not present

## 2017-06-02 DIAGNOSIS — D693 Immune thrombocytopenic purpura: Secondary | ICD-10-CM

## 2017-06-02 DIAGNOSIS — I6302 Cerebral infarction due to thrombosis of basilar artery: Secondary | ICD-10-CM

## 2017-06-02 DIAGNOSIS — I639 Cerebral infarction, unspecified: Secondary | ICD-10-CM | POA: Diagnosis not present

## 2017-06-02 LAB — CBC WITH DIFFERENTIAL/PLATELET
BASO%: 0.6 % (ref 0.0–2.0)
Basophils Absolute: 0 10e3/uL (ref 0.0–0.1)
EOS%: 1.7 % (ref 0.0–7.0)
Eosinophils Absolute: 0.1 10e3/uL (ref 0.0–0.5)
HCT: 38.6 % (ref 34.8–46.6)
HGB: 12.9 g/dL (ref 11.6–15.9)
LYMPH%: 40 % (ref 14.0–49.7)
MCH: 28.7 pg (ref 25.1–34.0)
MCHC: 33.5 g/dL (ref 31.5–36.0)
MCV: 85.7 fL (ref 79.5–101.0)
MONO#: 0.6 10e3/uL (ref 0.1–0.9)
MONO%: 9.7 % (ref 0.0–14.0)
NEUT#: 2.8 10e3/uL (ref 1.5–6.5)
NEUT%: 48 % (ref 38.4–76.8)
Platelets: 94 10e3/uL — ABNORMAL LOW (ref 145–400)
RBC: 4.5 10e6/uL (ref 3.70–5.45)
RDW: 14.4 % (ref 11.2–14.5)
WBC: 5.7 10e3/uL (ref 3.9–10.3)
lymph#: 2.3 10e3/uL (ref 0.9–3.3)

## 2017-06-02 MED ORDER — FAMOTIDINE 20 MG PO TABS
20.0000 mg | ORAL_TABLET | Freq: Once | ORAL | Status: AC
  Administered 2017-06-02: 20 mg via ORAL

## 2017-06-02 MED ORDER — FAMOTIDINE 20 MG PO TABS
ORAL_TABLET | ORAL | Status: AC
  Filled 2017-06-02: qty 1

## 2017-06-02 MED ORDER — ACETAMINOPHEN 325 MG PO TABS
650.0000 mg | ORAL_TABLET | Freq: Once | ORAL | Status: AC
  Administered 2017-06-02: 650 mg via ORAL

## 2017-06-02 MED ORDER — DIPHENHYDRAMINE HCL 25 MG PO TABS
25.0000 mg | ORAL_TABLET | Freq: Once | ORAL | Status: AC
  Administered 2017-06-02: 25 mg via ORAL
  Filled 2017-06-02: qty 1

## 2017-06-02 MED ORDER — DIPHENHYDRAMINE HCL 25 MG PO CAPS
ORAL_CAPSULE | ORAL | Status: AC
  Filled 2017-06-02: qty 1

## 2017-06-02 MED ORDER — IMMUNE GLOBULIN (HUMAN) 20 GM/200ML IV SOLN
1.0000 g/kg | Freq: Once | INTRAVENOUS | Status: AC
  Administered 2017-06-02: 60 g via INTRAVENOUS
  Filled 2017-06-02: qty 600

## 2017-06-02 MED ORDER — ACETAMINOPHEN 325 MG PO TABS
ORAL_TABLET | ORAL | Status: AC
  Filled 2017-06-02: qty 2

## 2017-06-02 NOTE — Assessment & Plan Note (Signed)
She has extensive bruises and poor venous access I would recommend having a port put in. She will consider that

## 2017-06-02 NOTE — Assessment & Plan Note (Signed)
The patient had history of stroke She claims she had poor memory because of this She defer all decision making to her husband.

## 2017-06-02 NOTE — Telephone Encounter (Signed)
Gave avs and calendar for October  °

## 2017-06-02 NOTE — Assessment & Plan Note (Signed)
The patient has recurrent ITP, with poor tolerance to steroid treatment, subsequently had complete response to IVIG We discussed premedication before IVIG She is instructed to take round-the-clock Tylenol for the next 2 days to prevent idiosyncratic reactions to IVIG We discussed other treatment options but the patient is not willing to consider She is open to the suggestion of coming here monthly for IVIG support I plan to see her back again next month

## 2017-06-02 NOTE — Progress Notes (Signed)
[facility] OFFICE PROGRESS NOTE  Adrienne Ada, MD SUMMARY OF HEMATOLOGIC HISTORY:  This is a pleasant lady with chronic thrombocytopenia. The patient was discovered to have low platelet count when she was placed on Plavix for coronary artery disease. 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 She is being observed. On 01/04/2016, she was started on prednisone therapy for ITP when her platelet count dip under  50,000 May 2017, she tolerated prednisone poorly and prednisone is slowly tapered off 05/05/2017, she had relapsed ITP and started on IVIG with complete response to Rx INTERVAL HISTORY: [name] 83 y.o. female returns for further treatment She had recent hemorrhoidal bleeding, resolved She denies recent epistaxis, hematuria or other spontaneous bleeding She bruises easily She denies recent infection  I have reviewed the past medical history, past surgical history, social history and family history with the patient and they are unchanged from previous note.  ALLERGIES:  is allergic to bee venom; iodides; lyrica [pregabalin]; codeine; demerol [meperidine]; ambien [zolpidem tartrate]; aspirin; celebrex [celecoxib]; darvocet [propoxyphene n-acetaminophen]; fosamax [alendronate sodium]; lipitor [atorvastatin]; morphine and related; valium [diazepam]; and iodine.  MEDICATIONS:  Current Outpatient Prescriptions  Medication Sig Dispense Refill  . amLODipine (NORVASC) 5 MG tablet Take 1 tablet (5 mg total) by mouth daily. 90 tablet 3  . calcium citrate (CALCITRATE - DOSED IN MG ELEMENTAL CALCIUM) 950 MG tablet Take 1 tablet by mouth daily.    . cetirizine (ZYRTEC) 10 MG tablet Take 10 mg by mouth daily.    . Cholecalciferol (VITAMIN D-3 PO) Take 1 tablet by mouth daily.    Marland Kitchen EPINEPHrine  (ADRENALIN) 1 MG/ML injection Inject 1 mg as directed as needed for anaphylaxis. Reported on 02/01/2016    . ezetimibe (ZETIA) 10 MG tablet Take 1 tablet (10 mg total) by mouth daily. 30 tablet 6  . Lysine 500 MG CAPS Take 1 capsule by mouth 2 (two) times daily as needed (fever blisters).     . meclizine (ANTIVERT) 25 MG tablet Take 25 mg by mouth daily as needed for dizziness.     . Multiple Vitamins-Minerals (CENTRUM SILVER 50+WOMEN PO) Take 1 tablet by mouth daily.    . nitroGLYCERIN (NITROSTAT) 0.4 MG SL tablet Place 0.4 mg under the tongue every 5 (five) minutes as needed for chest pain. Reported on 02/01/2016    . Omega 3 1000 MG CAPS Take 1 each by mouth daily.     Marland Kitchen omeprazole (PRILOSEC) 40 MG capsule Take 40 mg by mouth daily.    . Prenatal Vit-Fe Fumarate-FA (PRENATAL VITAMIN PO) Take 1 tablet by mouth 2 (two) times daily.    . psyllium (METAMUCIL) 58.6 % powder Take 1 packet by mouth daily.    . rosuvastatin (CRESTOR) 10 MG tablet Take 10 mg by mouth daily.  3  . sotalol (BETAPACE) 160 MG tablet TAKE 1 TABLET (160 MG TOTAL) BY MOUTH 2 (TWO) TIMES DAILY. 60 tablet 10   No current facility-administered medications for this visit.      REVIEW OF SYSTEMS:   Constitutional: Denies fevers, chills or night sweats Eyes: Denies blurriness of vision Ears, nose, mouth, throat, and face: Denies mucositis or sore throat Respiratory: Denies cough, dyspnea or wheezes Cardiovascular: Denies palpitation, chest discomfort or lower extremity swelling Gastrointestinal:  Denies nausea, heartburn or change in bowel habits Skin: Denies abnormal skin rashes  Lymphatics: Denies new lymphadenopathy Neurological:Denies numbness, tingling or new weaknesses Behavioral/Psych: Mood is stable, no new changes  All other systems were reviewed with the patient and are negative.  PHYSICAL EXAMINATION: ECOG PERFORMANCE STATUS: 1 - Symptomatic but completely ambulatory  Vitals:   06/02/17 0808  BP: (!) 123/46   Pulse: (!) 51  Resp: 20  Temp: 98.1 F (36.7 C)  SpO2: 98%   Filed Weights   06/02/17 0808  Weight: 133 lb 8 oz (60.6 kg)    GENERAL:alert, no distress and comfortable SKIN: skin color, texture, turgor are normal, no rashes or significant lesions EYES: normal, Conjunctiva are pink and non-injected, sclera clear OROPHARYNX:no exudate, no erythema and lips, buccal mucosa, and tongue normal  NECK: supple, thyroid normal size, non-tender, without nodularity LYMPH:  no palpable lymphadenopathy in the cervical, axillary or inguinal LUNGS: clear to auscultation and percussion with normal breathing effort HEART: regular rate & rhythm and no murmurs and no lower extremity edema ABDOMEN:abdomen soft, non-tender and normal bowel sounds Musculoskeletal:no cyanosis of digits and no clubbing  NEURO: alert & oriented x 3 with fluent speech, no focal motor/sensory deficits  LABORATORY DATA:  I have reviewed the data as listed     Component Value Date/Time   NA 142 05/05/2017 0850   K 4.0 05/05/2017 0850   CL 99 09/29/2016 0915   CL 104 08/02/2012 1515   CO2 25 05/05/2017 0850   GLUCOSE 130 05/05/2017 0850   GLUCOSE 105 (H) 08/02/2012 1515   BUN 18.9 05/05/2017 0850   CREATININE 0.9 05/05/2017 0850   CALCIUM 10.0 05/05/2017 0850   PROT 6.9 05/05/2017 0850   ALBUMIN 3.8 05/05/2017 0850   AST 19 05/05/2017 0850   ALT 14 05/05/2017 0850   ALKPHOS 54 05/05/2017 0850   BILITOT 0.48 05/05/2017 0850   GFRNONAA 57 (L) 09/29/2016 0915   GFRAA 66 09/29/2016 0915    No results found for: SPEP, UPEP  Lab Results  Component Value Date   WBC 5.7 06/02/2017   NEUTROABS 2.8 06/02/2017   HGB 12.9 06/02/2017   HCT 38.6 06/02/2017   MCV 85.7 06/02/2017   PLT 94 (L) 06/02/2017      Chemistry      Component Value Date/Time   NA 142 05/05/2017 0850   K 4.0 05/05/2017 0850   CL 99 09/29/2016 0915   CL 104 08/02/2012 1515   CO2 25 05/05/2017 0850   BUN 18.9 05/05/2017 0850   CREATININE  0.9 05/05/2017 0850      Component Value Date/Time   CALCIUM 10.0 05/05/2017 0850   ALKPHOS 54 05/05/2017 0850   AST 19 05/05/2017 0850   ALT 14 05/05/2017 0850   BILITOT 0.48 05/05/2017 0850    ASSESSMENT & PLAN:  Idiopathic thrombocytopenic purpura (HCC) The patient has recurrent ITP, with poor tolerance to steroid treatment, subsequently had complete response to IVIG We discussed premedication before IVIG She is instructed to take round-the-clock Tylenol for the next 2 days to prevent idiosyncratic reactions to IVIG We discussed other treatment options but the patient is not willing to consider She is open to the suggestion of coming here monthly for IVIG support I plan to see her back again next month  Poor venous access She has extensive bruises and poor venous access I would recommend having a port put in. She will consider that  CVA (cerebral vascular accident) [facility]) The patient had history of stroke She claims she had poor memory because of this She defer all decision  making to her husband.   No orders of the defined types were placed in this encounter.   All questions were answered. The patient knows to call the clinic with any problems, questions or concerns. No barriers to learning was detected.  I spent 15 minutes counseling the patient face to face. The total time spent in the appointment was 20 minutes and more than 50% was on counseling.     Heath Lark, MD 9/14/20188:37 AM

## 2017-06-02 NOTE — Progress Notes (Signed)
Pt refused to stay for 30 minutes observation post infusion due to weather.

## 2017-06-02 NOTE — Patient Instructions (Signed)

## 2017-06-30 ENCOUNTER — Other Ambulatory Visit (HOSPITAL_BASED_OUTPATIENT_CLINIC_OR_DEPARTMENT_OTHER): Payer: Medicare Other

## 2017-06-30 ENCOUNTER — Encounter: Payer: Self-pay | Admitting: Hematology and Oncology

## 2017-06-30 ENCOUNTER — Ambulatory Visit (HOSPITAL_BASED_OUTPATIENT_CLINIC_OR_DEPARTMENT_OTHER): Payer: Medicare Other | Admitting: Hematology and Oncology

## 2017-06-30 ENCOUNTER — Ambulatory Visit: Payer: Medicare Other | Admitting: Interventional Cardiology

## 2017-06-30 ENCOUNTER — Telehealth: Payer: Self-pay | Admitting: Hematology and Oncology

## 2017-06-30 ENCOUNTER — Ambulatory Visit (HOSPITAL_BASED_OUTPATIENT_CLINIC_OR_DEPARTMENT_OTHER): Payer: Medicare Other

## 2017-06-30 VITALS — BP 119/70 | HR 54 | Temp 97.9°F | Resp 18 | Ht 62.0 in | Wt 133.2 lb

## 2017-06-30 VITALS — BP 154/71 | HR 56 | Temp 97.7°F | Resp 16

## 2017-06-30 DIAGNOSIS — D693 Immune thrombocytopenic purpura: Secondary | ICD-10-CM

## 2017-06-30 DIAGNOSIS — I878 Other specified disorders of veins: Secondary | ICD-10-CM | POA: Diagnosis not present

## 2017-06-30 LAB — CBC WITH DIFFERENTIAL/PLATELET
BASO%: 0.4 % (ref 0.0–2.0)
BASOS ABS: 0 10*3/uL (ref 0.0–0.1)
EOS ABS: 0.1 10*3/uL (ref 0.0–0.5)
EOS%: 1.6 % (ref 0.0–7.0)
HEMATOCRIT: 39 % (ref 34.8–46.6)
HGB: 12.9 g/dL (ref 11.6–15.9)
LYMPH#: 2 10*3/uL (ref 0.9–3.3)
LYMPH%: 39.1 % (ref 14.0–49.7)
MCH: 29.1 pg (ref 25.1–34.0)
MCHC: 33.1 g/dL (ref 31.5–36.0)
MCV: 87.8 fL (ref 79.5–101.0)
MONO#: 0.6 10*3/uL (ref 0.1–0.9)
MONO%: 11.4 % (ref 0.0–14.0)
NEUT%: 47.5 % (ref 38.4–76.8)
NEUTROS ABS: 2.4 10*3/uL (ref 1.5–6.5)
Platelets: 103 10*3/uL — ABNORMAL LOW (ref 145–400)
RBC: 4.44 10*6/uL (ref 3.70–5.45)
RDW: 14.5 % (ref 11.2–14.5)
WBC: 5 10*3/uL (ref 3.9–10.3)

## 2017-06-30 MED ORDER — LIDOCAINE-PRILOCAINE 2.5-2.5 % EX CREA: 1.0000 "application " | TOPICAL_CREAM | CUTANEOUS | 6 refills | Status: DC | PRN

## 2017-06-30 MED ORDER — IMMUNE GLOBULIN (HUMAN) 20 GM/200ML IV SOLN
1.0000 g/kg | Freq: Once | INTRAVENOUS | Status: AC
  Administered 2017-06-30: 60 g via INTRAVENOUS
  Filled 2017-06-30: qty 600

## 2017-06-30 MED ORDER — DIPHENHYDRAMINE HCL 25 MG PO TABS
25.0000 mg | ORAL_TABLET | Freq: Once | ORAL | Status: AC
  Administered 2017-06-30: 25 mg via ORAL
  Filled 2017-06-30: qty 1

## 2017-06-30 MED ORDER — ACETAMINOPHEN 325 MG PO TABS
650.0000 mg | ORAL_TABLET | Freq: Once | ORAL | Status: AC
  Administered 2017-06-30: 650 mg via ORAL

## 2017-06-30 MED ORDER — DIPHENHYDRAMINE HCL 25 MG PO CAPS
ORAL_CAPSULE | ORAL | Status: AC
  Filled 2017-06-30: qty 1

## 2017-06-30 MED ORDER — ACETAMINOPHEN 325 MG PO TABS
ORAL_TABLET | ORAL | Status: AC
  Filled 2017-06-30: qty 2

## 2017-06-30 MED ORDER — FAMOTIDINE 20 MG PO TABS
20.0000 mg | ORAL_TABLET | Freq: Once | ORAL | Status: AC
  Administered 2017-06-30: 20 mg via ORAL

## 2017-06-30 MED ORDER — FAMOTIDINE 20 MG PO TABS
ORAL_TABLET | ORAL | Status: AC
  Filled 2017-06-30: qty 1

## 2017-06-30 NOTE — Assessment & Plan Note (Signed)
The patient has recurrent ITP, with poor tolerance to steroid treatment, subsequently had complete response to IVIG We discussed premedication before IVIG She is instructed to take round-the-clock Tylenol for the next 2 days to prevent idiosyncratic reactions to IVIG We discussed other treatment options but the patient is not willing to consider She is open to the suggestion of coming here monthly for IVIG support I plan to see her back again next month

## 2017-06-30 NOTE — Progress Notes (Signed)
[facility] OFFICE PROGRESS NOTE  [name], MD SUMMARY OF HEMATOLOGIC HISTORY:  This is a pleasant lady with chronic thrombocytopenia. The patient was discovered to have low platelet count when she was placed on Plavix for coronary artery disease. 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 She is being observed. On 01/04/2016, she was started on prednisone therapy for ITP when her platelet count dip under  50,000 May 2017, she tolerated prednisone poorly and prednisone is slowly tapered off 05/05/2017, she had relapsed ITP and started on IVIG with complete response to Rx INTERVAL HISTORY: [name] 83 y.o. female returns for further follow-up She is doing well She denies side effects from IVIG recently No recent infection No recent chest pain or shortness of breath The patient denies any recent signs or symptoms of bleeding such as spontaneous epistaxis, hematuria or hematochezia.\  I have reviewed the past medical history, past surgical history, social history and family history with the patient and they are unchanged from previous note.  ALLERGIES:  is allergic to bee venom; iodides; lyrica [pregabalin]; codeine; demerol [meperidine]; ambien [zolpidem tartrate]; aspirin; celebrex [celecoxib]; darvocet [propoxyphene n-acetaminophen]; fosamax [alendronate sodium]; lipitor [atorvastatin]; morphine and related; valium [diazepam]; and iodine.  MEDICATIONS:  Current Outpatient Prescriptions  Medication Sig Dispense Refill  . amLODipine (NORVASC) 5 MG tablet Take 1 tablet (5 mg total) by mouth daily. 90 tablet 3  . calcium citrate (CALCITRATE - DOSED IN MG ELEMENTAL CALCIUM) 950 MG tablet Take 1 tablet by mouth daily.    . cetirizine (ZYRTEC) 10 MG tablet Take 10 mg by mouth daily.    .  Cholecalciferol (VITAMIN D-3 PO) Take 1 tablet by mouth daily.    Marland Kitchen EPINEPHrine (ADRENALIN) 1 MG/ML injection Inject 1 mg as directed as needed for anaphylaxis. Reported on 02/01/2016    . ezetimibe (ZETIA) 10 MG tablet Take 1 tablet (10 mg total) by mouth daily. 30 tablet 6  . lidocaine-prilocaine (EMLA) cream Apply 1 application topically as needed. 30 g 6  . Lysine 500 MG CAPS Take 1 capsule by mouth 2 (two) times daily as needed (fever blisters).     . meclizine (ANTIVERT) 25 MG tablet Take 25 mg by mouth daily as needed for dizziness.     . Multiple Vitamins-Minerals (CENTRUM SILVER 50+WOMEN PO) Take 1 tablet by mouth daily.    . nitroGLYCERIN (NITROSTAT) 0.4 MG SL tablet Place 0.4 mg under the tongue every 5 (five) minutes as needed for chest pain. Reported on 02/01/2016    . Omega 3 1000 MG CAPS Take 1 each by mouth daily.     Marland Kitchen omeprazole (PRILOSEC) 40 MG capsule Take 40 mg by mouth daily.    . Prenatal Vit-Fe Fumarate-FA (PRENATAL VITAMIN PO) Take 1 tablet by mouth 2 (two) times daily.    . psyllium (METAMUCIL) 58.6 % powder Take 1 packet by mouth daily.    . rosuvastatin (CRESTOR) 10 MG tablet Take 10 mg by mouth daily.  3  . sotalol (BETAPACE) 160 MG tablet TAKE 1 TABLET (160 MG TOTAL) BY MOUTH 2 (TWO) TIMES DAILY. 60 tablet 10   No current facility-administered medications for this visit.      REVIEW OF SYSTEMS:   Constitutional: Denies fevers, chills or night sweats Eyes: Denies blurriness of vision Ears, nose, mouth, throat, and face: Denies mucositis or sore  throat Respiratory: Denies cough, dyspnea or wheezes Cardiovascular: Denies palpitation, chest discomfort or lower extremity swelling Gastrointestinal:  Denies nausea, heartburn or change in bowel habits Skin: Denies abnormal skin rashes Lymphatics: Denies new lymphadenopathy or easy bruising Neurological:Denies numbness, tingling or new weaknesses Behavioral/Psych: Mood is stable, no new changes  All other systems were  reviewed with the patient and are negative.  PHYSICAL EXAMINATION: ECOG PERFORMANCE STATUS: 0 - Asymptomatic  Vitals:   06/30/17 0817  BP: 119/70  Pulse: (!) 54  Resp: 18  Temp: 97.9 F (36.6 C)  SpO2: 98%   Filed Weights   06/30/17 0817  Weight: 133 lb 3.2 oz (60.4 kg)    GENERAL:alert, no distress and comfortable SKIN: skin color, texture, turgor are normal, no rashes or significant lesions EYES: normal, Conjunctiva are pink and non-injected, sclera clear OROPHARYNX:no exudate, no erythema and lips, buccal mucosa, and tongue normal  NECK: supple, thyroid normal size, non-tender, without nodularity LYMPH:  no palpable lymphadenopathy in the cervical, axillary or inguinal LUNGS: clear to auscultation and percussion with normal breathing effort HEART: regular rate & rhythm and no murmurs and no lower extremity edema ABDOMEN:abdomen soft, non-tender and normal bowel sounds Musculoskeletal:no cyanosis of digits and no clubbing  NEURO: alert & oriented x 3 with fluent speech, no focal motor/sensory deficits  LABORATORY DATA:  I have reviewed the data as listed     Component Value Date/Time   NA 142 05/05/2017 0850   K 4.0 05/05/2017 0850   CL 99 09/29/2016 0915   CL 104 08/02/2012 1515   CO2 25 05/05/2017 0850   GLUCOSE 130 05/05/2017 0850   GLUCOSE 105 (H) 08/02/2012 1515   BUN 18.9 05/05/2017 0850   CREATININE 0.9 05/05/2017 0850   CALCIUM 10.0 05/05/2017 0850   PROT 6.9 05/05/2017 0850   ALBUMIN 3.8 05/05/2017 0850   AST 19 05/05/2017 0850   ALT 14 05/05/2017 0850   ALKPHOS 54 05/05/2017 0850   BILITOT 0.48 05/05/2017 0850   GFRNONAA 57 (L) 09/29/2016 0915   GFRAA 66 09/29/2016 0915    No results found for: SPEP, UPEP  Lab Results  Component Value Date   WBC 5.0 06/30/2017   NEUTROABS 2.4 06/30/2017   HGB 12.9 06/30/2017   HCT 39.0 06/30/2017   MCV 87.8 06/30/2017   PLT 103 (L) 06/30/2017      Chemistry      Component Value Date/Time   NA 142  05/05/2017 0850   K 4.0 05/05/2017 0850   CL 99 09/29/2016 0915   CL 104 08/02/2012 1515   CO2 25 05/05/2017 0850   BUN 18.9 05/05/2017 0850   CREATININE 0.9 05/05/2017 0850      Component Value Date/Time   CALCIUM 10.0 05/05/2017 0850   ALKPHOS 54 05/05/2017 0850   AST 19 05/05/2017 0850   ALT 14 05/05/2017 0850   BILITOT 0.48 05/05/2017 0850      ASSESSMENT & PLAN:  Idiopathic thrombocytopenic purpura (HCC) The patient has recurrent ITP, with poor tolerance to steroid treatment, subsequently had complete response to IVIG We discussed premedication before IVIG She is instructed to take round-the-clock Tylenol for the next 2 days to prevent idiosyncratic reactions to IVIG We discussed other treatment options but the patient is not willing to consider She is open to the suggestion of coming here monthly for IVIG support I plan to see her back again next month  Poor venous access We have extensive discussion recently about port placement Ultimately, the patient agree for  port placement   Orders Placed This Encounter  Procedures  . IR FLUORO GUIDE PORT INSERTION RIGHT    Standing Status:   Future    Standing Expiration Date:   08/30/2018    Order Specific Question:   Reason for Exam (SYMPTOM  OR DIAGNOSIS REQUIRED)    Answer:   need port for long term IV access    Order Specific Question:   Preferred Imaging Location?    Answer:   [facility]    All questions were answered. The patient knows to call the clinic with any problems, questions or concerns. No barriers to learning was detected.  I spent 15 minutes counseling the patient face to face. The total time spent in the appointment was 20 minutes and more than 50% was on counseling.     Heath Lark, MD 10/12/201811:39 AM

## 2017-06-30 NOTE — Telephone Encounter (Signed)
Scheduled appt per 10/12 los - patient to get an updated schedule in the treatment area.

## 2017-06-30 NOTE — Patient Instructions (Signed)

## 2017-06-30 NOTE — Assessment & Plan Note (Signed)
We have extensive discussion recently about port placement Ultimately, the patient agree for port placement

## 2017-07-07 ENCOUNTER — Other Ambulatory Visit: Payer: Self-pay | Admitting: Radiology

## 2017-07-09 ENCOUNTER — Other Ambulatory Visit: Payer: Self-pay | Admitting: Radiology

## 2017-07-11 ENCOUNTER — Ambulatory Visit (HOSPITAL_COMMUNITY)
Admission: RE | Admit: 2017-07-11 | Discharge: 2017-07-11 | Disposition: A | Discharge status: Home / Self Care | Payer: Medicare Other | Source: Ambulatory Visit | Attending: Hematology and Oncology | Admitting: Hematology and Oncology

## 2017-07-11 ENCOUNTER — Other Ambulatory Visit: Payer: Self-pay | Admitting: Hematology and Oncology

## 2017-07-11 ENCOUNTER — Encounter (HOSPITAL_COMMUNITY): Payer: Self-pay

## 2017-07-11 DIAGNOSIS — M810 Age-related osteoporosis without current pathological fracture: Secondary | ICD-10-CM | POA: Diagnosis not present

## 2017-07-11 DIAGNOSIS — E7800 Pure hypercholesterolemia, unspecified: Secondary | ICD-10-CM | POA: Diagnosis not present

## 2017-07-11 DIAGNOSIS — I2510 Atherosclerotic heart disease of native coronary artery without angina pectoris: Secondary | ICD-10-CM | POA: Insufficient documentation

## 2017-07-11 DIAGNOSIS — D693 Immune thrombocytopenic purpura: Secondary | ICD-10-CM | POA: Diagnosis not present

## 2017-07-11 DIAGNOSIS — Z951 Presence of aortocoronary bypass graft: Secondary | ICD-10-CM | POA: Diagnosis not present

## 2017-07-11 DIAGNOSIS — Z8673 Personal history of transient ischemic attack (TIA), and cerebral infarction without residual deficits: Secondary | ICD-10-CM | POA: Insufficient documentation

## 2017-07-11 DIAGNOSIS — Z7902 Long term (current) use of antithrombotics/antiplatelets: Secondary | ICD-10-CM | POA: Insufficient documentation

## 2017-07-11 HISTORY — PX: IR FLUORO GUIDE PORT INSERTION RIGHT: IMG5741

## 2017-07-11 HISTORY — PX: IR US GUIDE VASC ACCESS RIGHT: IMG2390

## 2017-07-11 LAB — CBC WITH DIFFERENTIAL/PLATELET
BASOS ABS: 0 10*3/uL (ref 0.0–0.1)
BASOS PCT: 0 %
Eosinophils Absolute: 0.1 10*3/uL (ref 0.0–0.7)
Eosinophils Relative: 1 %
HEMATOCRIT: 38.5 % (ref 36.0–46.0)
HEMOGLOBIN: 13.1 g/dL (ref 12.0–15.0)
LYMPHS PCT: 52 %
Lymphs Abs: 2.7 10*3/uL (ref 0.7–4.0)
MCH: 29.4 pg (ref 26.0–34.0)
MCHC: 34 g/dL (ref 30.0–36.0)
MCV: 86.3 fL (ref 78.0–100.0)
MONO ABS: 0.5 10*3/uL (ref 0.1–1.0)
MONOS PCT: 9 %
NEUTROS ABS: 2 10*3/uL (ref 1.7–7.7)
NEUTROS PCT: 38 %
Platelets: 215 10*3/uL (ref 150–400)
RBC: 4.46 MIL/uL (ref 3.87–5.11)
RDW: 14.3 % (ref 11.5–15.5)
WBC: 5.3 10*3/uL (ref 4.0–10.5)

## 2017-07-11 LAB — BASIC METABOLIC PANEL
ANION GAP: 9 (ref 5–15)
BUN: 17 mg/dL (ref 6–20)
CALCIUM: 9.7 mg/dL (ref 8.9–10.3)
CO2: 24 mmol/L (ref 22–32)
Chloride: 104 mmol/L (ref 101–111)
Creatinine, Ser: 0.97 mg/dL (ref 0.44–1.00)
GFR, EST NON AFRICAN AMERICAN: 53 mL/min — AB (ref >60)
GLUCOSE: 110 mg/dL — AB (ref 65–99)
POTASSIUM: 4.3 mmol/L (ref 3.5–5.1)
Sodium: 137 mmol/L (ref 135–145)

## 2017-07-11 LAB — PROTIME-INR
INR: 0.93
Prothrombin Time: 12.4 seconds (ref 11.4–15.2)

## 2017-07-11 MED ORDER — HEPARIN SOD (PORK) LOCK FLUSH 100 UNIT/ML IV SOLN
INTRAVENOUS | Status: AC
  Filled 2017-07-11: qty 5

## 2017-07-11 MED ORDER — MIDAZOLAM HCL 2 MG/2ML IJ SOLN
INTRAMUSCULAR | Status: AC
  Filled 2017-07-11: qty 4

## 2017-07-11 MED ORDER — SODIUM CHLORIDE 0.9 % IV SOLN
INTRAVENOUS | Status: DC
  Administered 2017-07-11: 10:00:00 via INTRAVENOUS

## 2017-07-11 MED ORDER — CEFAZOLIN SODIUM-DEXTROSE 2-4 GM/100ML-% IV SOLN
INTRAVENOUS | Status: AC
  Administered 2017-07-11: 2 g via INTRAVENOUS
  Filled 2017-07-11: qty 100

## 2017-07-11 MED ORDER — MIDAZOLAM HCL 2 MG/2ML IJ SOLN
INTRAMUSCULAR | Status: AC | PRN
  Administered 2017-07-11 (×2): 1 mg via INTRAVENOUS

## 2017-07-11 MED ORDER — LIDOCAINE-EPINEPHRINE (PF) 2 %-1:200000 IJ SOLN
INTRAMUSCULAR | Status: AC
  Filled 2017-07-11: qty 20

## 2017-07-11 MED ORDER — LIDOCAINE HCL 1 % IJ SOLN
INTRAMUSCULAR | Status: AC | PRN
  Administered 2017-07-11: 20 mL

## 2017-07-11 MED ORDER — FENTANYL CITRATE (PF) 100 MCG/2ML IJ SOLN
INTRAMUSCULAR | Status: AC
  Filled 2017-07-11: qty 4

## 2017-07-11 MED ORDER — CEFAZOLIN SODIUM-DEXTROSE 2-4 GM/100ML-% IV SOLN
2.0000 g | INTRAVENOUS | Status: AC
  Administered 2017-07-11: 2 g via INTRAVENOUS

## 2017-07-11 MED ORDER — FENTANYL CITRATE (PF) 100 MCG/2ML IJ SOLN
INTRAMUSCULAR | Status: AC | PRN
  Administered 2017-07-11 (×2): 50 ug via INTRAVENOUS

## 2017-07-11 NOTE — Discharge Instructions (Signed)
Moderate Conscious Sedation, Adult, Care After °These instructions provide you with information about caring for yourself after your procedure. Your health care provider may also give you more specific instructions. Your treatment has been planned according to current medical practices, but problems sometimes occur. Call your health care provider if you have any problems or questions after your procedure. °What can I expect after the procedure? °After your procedure, it is common: °· To feel sleepy for several hours. °· To feel clumsy and have poor balance for several hours. °· To have poor judgment for several hours. °· To vomit if you eat too soon. ° °Follow these instructions at home: °For at least 24 hours after the procedure: ° °· Do not: °? Participate in activities where you could fall or become injured. °? Drive. °? Use heavy machinery. °? Drink alcohol. °? Take sleeping pills or medicines that cause drowsiness. °? Make important decisions or sign legal documents. °? Take care of children on your own. °· Rest. °Eating and drinking °· Follow the diet recommended by your health care provider. °· If you vomit: °? Drink water, juice, or soup when you can drink without vomiting. °? Make sure you have little or no nausea before eating solid foods. °General instructions °· Have a responsible adult stay with you until you are awake and alert. °· Take over-the-counter and prescription medicines only as told by your health care provider. °· If you smoke, do not smoke without supervision. °· Keep all follow-up visits as told by your health care provider. This is important. °Contact a health care provider if: °· You keep feeling nauseous or you keep vomiting. °· You feel light-headed. °· You develop a rash. °· You have a fever. °Get help right away if: °· You have trouble breathing. °This information is not intended to replace advice given to you by your health care provider. Make sure you discuss any questions you have  with your health care provider. °Document Released: 06/26/2013 Document Revised: 02/08/2016 Document Reviewed: 12/26/2015 °Elsevier Interactive Patient Education © 2018 [employer] °Implanted Port Insertion, Care After °This sheet gives you information about how to care for yourself after your procedure. Your health care provider may also give you more specific instructions. If you have problems or questions, contact your health care provider. °What can I expect after the procedure? °After your procedure, it is common to have: °· Discomfort at the port insertion site. °· Bruising on the skin over the port. This should improve over 3-4 days. ° °Follow these instructions at home: °Port care °· After your port is placed, you will get a manufacturer's information card. The card has information about your port. Keep this card with you at all times. °· Take care of the port as told by your health care provider. Ask your health care provider if you or a family member can get training for taking care of the port at home. A home health care nurse may also take care of the port. °· Make sure to remember what type of port you have. °Incision care °· Follow instructions from your health care provider about how to take care of your port insertion site. Make sure you: °? Wash your hands with soap and water before you change your bandage (dressing). If soap and water are not available, use hand sanitizer. °? Change your dressing as told by your health care provider. °? Leave stitches (sutures), skin glue, or adhesive strips in place. These skin closures may need to stay   in place for 2 weeks or longer. If adhesive strip edges start to loosen and curl up, you may trim the loose edges. Do not remove adhesive strips completely unless your health care provider tells you to do that. °· Check your port insertion site every day for signs of infection. Check for: °? More redness, swelling, or pain. °? More fluid or  blood. °? Warmth. °? Pus or a bad smell. °General instructions °· Do not take baths, swim, or use a hot tub until your health care provider approves. °· Do not lift anything that is heavier than 10 lb (4.5 kg) for a week, or as told by your health care provider. °· Ask your health care provider when it is okay to: °? Return to work or school. °? Resume usual physical activities or sports. °· Do not drive for 24 hours if you were given a medicine to help you relax (sedative). °· Take over-the-counter and prescription medicines only as told by your health care provider. °· Wear a medical alert bracelet in case of an emergency. This will tell any health care providers that you have a port. °· Keep all follow-up visits as told by your health care provider. This is important. °Contact a health care provider if: °· You cannot flush your port with saline as directed, or you cannot draw blood from the port. °· You have a fever or chills. °· You have more redness, swelling, or pain around your port insertion site. °· You have more fluid or blood coming from your port insertion site. °· Your port insertion site feels warm to the touch. °· You have pus or a bad smell coming from the port insertion site. °Get help right away if: °· You have chest pain or shortness of breath. °· You have bleeding from your port that you cannot control. °Summary °· Take care of the port as told by your health care provider. °· Change your dressing as told by your health care provider. °· Keep all follow-up visits as told by your health care provider. °This information is not intended to replace advice given to you by your health care provider. Make sure you discuss any questions you have with your health care provider. °Document Released: 06/26/2013 Document Revised: 07/27/2016 Document Reviewed: 07/27/2016 °Elsevier Interactive Patient Education © 2017 Elsevier Inc. ° °

## 2017-07-11 NOTE — Procedures (Signed)
Pre Procedure Dx: ITP, poor venous access Post Procedural Dx: Same  Successful placement of right IJ approach port-a-cath with tip at the superior caval atrial junction. The catheter is ready for immediate use.  Estimated Blood Loss: Minimal  Complications: None immediate.  Ronny Bacon, MD Pager #: [phone]

## 2017-07-11 NOTE — H&P (Signed)
Chief Complaint: Patient was seen in consultation today for poor venous access  Referring Physician(s): Gorsuch,Ni  Supervising Physician: Sandi Mariscal  Patient Status: [facility] - Out-pt  History of Present Illness: [name] is a 83 y.o. female with past medical history of CAD, CVA, osteoporosis, and ITP requiring regular infusions.  Patient has difficulty with IV access for her infusions.  IR consulted for Port-A-Cath placement at the request of Dr. Alvy Bimler.   Patient presents for procedure today in her usual state of health. She has been NPO.  She does not take blood thinners.   Past Medical History:  Diagnosis Date  . Bilateral hearing loss   . CAD (coronary artery disease) 2003   on Plavix.   . CVA (cerebral vascular accident) ([place]) 2010  . Fatigue   . Gross hematuria 07/03/2015  . Hemorrhagic stroke ([place]) 2010  . Hiatal hernia   . Hypercholesteremia   . ITP (idiopathic thrombocytopenic purpura) 01/04/2016  . Kidney stones   . Osteoporosis   . Pancreatitis 1970  . Pancreatitis   . Thrombocytopenia ([place])   . Unspecified deficiency anemia     Past Surgical History:  Procedure Laterality Date  . APPENDECTOMY    . CARDIAC VALVE SURGERY  2010  . CATARACT EXTRACTION  2017   x 2  . COLONOSCOPY     [facility], Dr. Wynetta Emery  . COLONOSCOPY WITH PROPOFOL N/A 04/09/2013   Procedure: COLONOSCOPY WITH PROPOFOL;  Surgeon: Garlan Fair, MD;  Location: [facility] ENDOSCOPY;  Service: Endoscopy;  Laterality: N/A;  . CORONARY ARTERY BYPASS GRAFT  06/2009  . FLEXIBLE SIGMOIDOSCOPY N/A 08/03/2015   Procedure: FLEXIBLE SIGMOIDOSCOPY;  Surgeon: Garlan Fair, MD;  Location: [facility] ENDOSCOPY;  Service: Endoscopy;  Laterality: N/A;  . TUBAL LIGATION      Allergies: Bee venom; Iodides; Lyrica [pregabalin]; Codeine; Demerol [meperidine]; Ambien [zolpidem tartrate]; Aspirin; Celebrex [celecoxib]; Darvocet [propoxyphene n-acetaminophen]; Fosamax [alendronate sodium]; Lipitor  [atorvastatin]; Morphine and related; Valium [diazepam]; and Iodine  Medications: Prior to Admission medications   Medication Sig Start Date End Date Taking? Authorizing Provider  calcium citrate (CALCITRATE - DOSED IN MG ELEMENTAL CALCIUM) 950 MG tablet Take 1 tablet by mouth daily.   Yes [provider]  cetirizine (ZYRTEC) 10 MG tablet Take 10 mg by mouth daily.   Yes [provider]  Cholecalciferol (VITAMIN D-3 PO) Take 1 tablet by mouth daily.   Yes [provider]  ezetimibe (ZETIA) 10 MG tablet Take 1 tablet (10 mg total) by mouth daily. 06/21/13  Yes Belva Crome, MD  Lysine 500 MG CAPS Take 1 capsule by mouth 2 (two) times daily as needed (fever blisters).    Yes [provider]  meclizine (ANTIVERT) 25 MG tablet Take 25 mg by mouth daily as needed for dizziness.    Yes [provider]  Multiple Vitamins-Minerals (CENTRUM SILVER 50+WOMEN PO) Take 1 tablet by mouth daily.   Yes [provider]  Omega 3 1000 MG CAPS Take 1 each by mouth daily.    Yes [provider]  omeprazole (PRILOSEC) 40 MG capsule Take 40 mg by mouth daily.   Yes [provider]  Prenatal Vit-Fe Fumarate-FA (PRENATAL VITAMIN PO) Take 1 tablet by mouth 2 (two) times daily.   Yes [provider]  psyllium (METAMUCIL) 58.6 % powder Take 1 packet by mouth daily.   Yes [provider]  rosuvastatin (CRESTOR) 10 MG tablet Take 10 mg by mouth daily. 09/10/16  Yes [provider]  sotalol (BETAPACE) 160 MG tablet TAKE 1 TABLET (160 MG TOTAL) BY MOUTH 2 (TWO) TIMES DAILY. 10/24/16  Yes Belva Crome, MD  amLODipine (NORVASC) 5 MG tablet Take 1 tablet (5 mg total) by mouth daily. 09/29/16 12/28/16  Belva Crome, MD  EPINEPHrine (ADRENALIN) 1 MG/ML injection Inject 1 mg as directed as needed for anaphylaxis. Reported on 02/01/2016    [provider]  lidocaine-prilocaine (EMLA) cream Apply 1 application topically as  needed. 06/30/17   Heath Lark, MD  nitroGLYCERIN (NITROSTAT) 0.4 MG SL tablet Place 0.4 mg under the tongue every 5 (five) minutes as needed for chest pain. Reported on 02/01/2016    [provider]     Family History  Problem Relation Age of Onset  . Cancer Mother 20       colon  . Heart attack Father   . Cancer Sister        colon, cervical  . Stroke Sister   . Memory loss Sister   . Cancer Brother        lung  . Dementia Brother     Social History   Social History  . Marital status: Married    Spouse name: Ed  . Number of children: 2  . Years of education: College   Occupational History  .      retired Estate manager/land agent   Social History Main Topics  . Smoking status: Never Smoker  . Smokeless tobacco: Never Used  . Alcohol use No  . Drug use: No  . Sexual activity: Not Asked   Other Topics Concern  . None   Social History Narrative   Pt lives at home with spouse.   Caffeine Use: Rarely    Review of Systems  Constitutional: Negative for fatigue and fever.  Respiratory: Negative for cough and shortness of breath.   Cardiovascular: Negative for chest pain.  Gastrointestinal: Negative for abdominal pain.  Psychiatric/Behavioral: Negative for behavioral problems and confusion.    Vital Signs: BP (!) 154/69 (BP Location: Left Arm)   Pulse (!) 50   Temp (!) 97.4 F (36.3 C) (Oral)   Resp 16   Ht 5\' 2"  (1.575 m)   Wt 133 lb 3.2 oz (60.4 kg)   SpO2 100%   BMI 24.36 kg/m   Physical Exam  Constitutional: She is oriented to person, place, and time. She appears well-developed.  Cardiovascular: Normal rate, regular rhythm and normal heart sounds.   Pulmonary/Chest: Effort normal and breath sounds normal. No respiratory distress.  Abdominal: Soft.  Neurological: She is alert and oriented to person, place, and time.  Skin: Skin is warm and dry.  Psychiatric: She has a normal mood and affect. Her behavior is normal. Judgment and thought content normal.    Nursing note and vitals reviewed.   Imaging: No results found.  Labs:  CBC:  Recent Labs  05/11/17 0850 06/02/17 0755 06/30/17 0754 07/11/17 0933  WBC 5.4 5.7 5.0 5.3  HGB 12.6 12.9 12.9 13.1  HCT 37.5 38.6 39.0 38.5  PLT 261 94* 103* 215    COAGS:  Recent Labs  07/11/17 0933  INR 0.93    BMP:  Recent Labs  09/29/16 0915 05/05/17 0850 07/11/17 0933  NA 139 142 137  K 4.6 4.0 4.3  CL 99  --  104  CO2 24 25 24   GLUCOSE 115* 130 110*  BUN 20 18.9 17  CALCIUM 9.6 10.0 9.7  CREATININE 0.94 0.9 0.97  GFRNONAA 57*  --  53*  GFRAA 66  --  >60    LIVER FUNCTION TESTS:  Recent Labs  09/29/16 0915 05/05/17 0850  BILITOT 0.3 0.48  AST 22 19  ALT 19 14  ALKPHOS 65 54  PROT 7.1 6.9  ALBUMIN 4.5 3.8    TUMOR MARKERS: No results for input(s): AFPTM, CEA, CA199, CHROMGRNA in the last 8760 hours.  Assessment and Plan: Patient with past medical history of ITP requiring regular transfusion presents with complaint of poor venous access.  IR consulted for Port-A-Cath placement at the request of Dr. Alvy Bimler. Patient presents today in their usual state of health.  She has been NPO and is not currently on blood thinners.  Risks and benefits discussed with the patient including, but not limited to bleeding, infection, pneumothorax, or fibrin sheath development and need for additional procedures. All of the patient's questions were answered, patient is agreeable to proceed. Consent signed and in chart.  Thank you for this interesting consult.  I greatly enjoyed meeting [name] and look forward to participating in their care.  A copy of this report was sent to the requesting provider on this date.  Electronically Signed: Docia Barrier, PA 07/11/2017, 11:25 AM   I spent a total of  30 Minutes   in face to face in clinical consultation, greater than 50% of which was counseling/coordinating care for poor venous access.

## 2017-07-28 ENCOUNTER — Ambulatory Visit (HOSPITAL_BASED_OUTPATIENT_CLINIC_OR_DEPARTMENT_OTHER): Payer: Medicare Other

## 2017-07-28 ENCOUNTER — Ambulatory Visit (HOSPITAL_BASED_OUTPATIENT_CLINIC_OR_DEPARTMENT_OTHER): Payer: Medicare Other | Admitting: Hematology and Oncology

## 2017-07-28 ENCOUNTER — Ambulatory Visit: Payer: Medicare Other

## 2017-07-28 ENCOUNTER — Other Ambulatory Visit (HOSPITAL_BASED_OUTPATIENT_CLINIC_OR_DEPARTMENT_OTHER): Payer: Medicare Other

## 2017-07-28 ENCOUNTER — Encounter: Payer: Self-pay | Admitting: Hematology and Oncology

## 2017-07-28 ENCOUNTER — Telehealth: Payer: Self-pay | Admitting: Hematology and Oncology

## 2017-07-28 VITALS — BP 147/66 | HR 44 | Temp 98.1°F | Resp 16

## 2017-07-28 DIAGNOSIS — D693 Immune thrombocytopenic purpura: Secondary | ICD-10-CM

## 2017-07-28 LAB — CBC WITH DIFFERENTIAL/PLATELET
BASO%: 0.4 % (ref 0.0–2.0)
BASOS ABS: 0 10*3/uL (ref 0.0–0.1)
EOS%: 1.4 % (ref 0.0–7.0)
Eosinophils Absolute: 0.1 10*3/uL (ref 0.0–0.5)
HCT: 36.8 % (ref 34.8–46.6)
HGB: 12.4 g/dL (ref 11.6–15.9)
LYMPH%: 41.6 % (ref 14.0–49.7)
MCH: 29.1 pg (ref 25.1–34.0)
MCHC: 33.7 g/dL (ref 31.5–36.0)
MCV: 86.4 fL (ref 79.5–101.0)
MONO#: 0.5 10*3/uL (ref 0.1–0.9)
MONO%: 11.5 % (ref 0.0–14.0)
NEUT#: 2.1 10*3/uL (ref 1.5–6.5)
NEUT%: 45.1 % (ref 38.4–76.8)
Platelets: 111 10*3/uL — ABNORMAL LOW (ref 145–400)
RBC: 4.26 10*6/uL (ref 3.70–5.45)
RDW: 14 % (ref 11.2–14.5)
WBC: 4.7 10*3/uL (ref 3.9–10.3)
lymph#: 2 10*3/uL (ref 0.9–3.3)

## 2017-07-28 MED ORDER — FAMOTIDINE 20 MG PO TABS
ORAL_TABLET | ORAL | Status: AC
  Filled 2017-07-28: qty 1

## 2017-07-28 MED ORDER — FAMOTIDINE 20 MG PO TABS
20.0000 mg | ORAL_TABLET | Freq: Once | ORAL | Status: AC
  Administered 2017-07-28: 20 mg via ORAL

## 2017-07-28 MED ORDER — SODIUM CHLORIDE 0.9% FLUSH
10.0000 mL | INTRAVENOUS | Status: DC | PRN
  Administered 2017-07-28: 10 mL
  Filled 2017-07-28: qty 10

## 2017-07-28 MED ORDER — IMMUNE GLOBULIN (HUMAN) 20 GM/200ML IV SOLN
1.0000 g/kg | Freq: Once | INTRAVENOUS | Status: AC
  Administered 2017-07-28: 60 g via INTRAVENOUS
  Filled 2017-07-28: qty 600

## 2017-07-28 MED ORDER — HEPARIN SOD (PORK) LOCK FLUSH 100 UNIT/ML IV SOLN
500.0000 [IU] | Freq: Once | INTRAVENOUS | Status: AC | PRN
  Administered 2017-07-28: 500 [IU]
  Filled 2017-07-28: qty 5

## 2017-07-28 MED ORDER — ACETAMINOPHEN 325 MG PO TABS
650.0000 mg | ORAL_TABLET | Freq: Once | ORAL | Status: AC
  Administered 2017-07-28: 650 mg via ORAL

## 2017-07-28 MED ORDER — DIPHENHYDRAMINE HCL 25 MG PO TABS
25.0000 mg | ORAL_TABLET | Freq: Once | ORAL | Status: AC
  Administered 2017-07-28: 25 mg via ORAL
  Filled 2017-07-28: qty 1

## 2017-07-28 MED ORDER — ACETAMINOPHEN 325 MG PO TABS
ORAL_TABLET | ORAL | Status: AC
  Filled 2017-07-28: qty 2

## 2017-07-28 MED ORDER — DIPHENHYDRAMINE HCL 25 MG PO CAPS
ORAL_CAPSULE | ORAL | Status: AC
  Filled 2017-07-28: qty 1

## 2017-07-28 NOTE — Progress Notes (Signed)
[place] OFFICE PROGRESS NOTE  Adrienne Ada, MD SUMMARY OF HEMATOLOGIC HISTORY:  This is a pleasant lady with chronic thrombocytopenia. The patient was discovered to have low platelet count when she was placed on Plavix for coronary artery disease. 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 She is being observed. On 01/04/2016, she was started on prednisone therapy for ITP when her platelet count dip under  50,000 May 2017, she tolerated prednisone poorly and prednisone is slowly tapered off 05/05/2017, she had relapsed ITP and started on IVIG with complete response to Rx but with platelet count trending down after 4 weeks  INTERVAL HISTORY: [name] 83 y.o. female returns for further follow-up. She had recent port placement. Her husband did not reveal any new issues.  No recent infection She denies infusion reactions to treatment The patient denies any recent signs or symptoms of bleeding such as spontaneous epistaxis, hematuria or hematochezia.   I have reviewed the past medical history, past surgical history, social history and family history with the patient and they are unchanged from previous note.  ALLERGIES:  is allergic to bee venom; iodides; lyrica [pregabalin]; codeine; demerol [meperidine]; ambien [zolpidem tartrate]; aspirin; celebrex [celecoxib]; darvocet [propoxyphene n-acetaminophen]; fosamax [alendronate sodium]; lipitor [atorvastatin]; morphine and related; valium [diazepam]; and iodine.  MEDICATIONS:  Current Outpatient Medications  Medication Sig Dispense Refill  . amLODipine (NORVASC) 5 MG tablet Take 1 tablet (5 mg total) by mouth daily. 90 tablet 3  . calcium citrate (CALCITRATE - DOSED IN MG ELEMENTAL CALCIUM) 950 MG tablet Take 1 tablet by mouth daily.    .  cetirizine (ZYRTEC) 10 MG tablet Take 10 mg by mouth daily.    . Cholecalciferol (VITAMIN D-3 PO) Take 1 tablet by mouth daily.    Marland Kitchen EPINEPHrine (ADRENALIN) 1 MG/ML injection Inject 1 mg as directed as needed for anaphylaxis. Reported on 02/01/2016    . ezetimibe (ZETIA) 10 MG tablet Take 1 tablet (10 mg total) by mouth daily. 30 tablet 6  . lidocaine-prilocaine (EMLA) cream Apply 1 application topically as needed. 30 g 6  . Lysine 500 MG CAPS Take 1 capsule by mouth 2 (two) times daily as needed (fever blisters).     . meclizine (ANTIVERT) 25 MG tablet Take 25 mg by mouth daily as needed for dizziness.     . Multiple Vitamins-Minerals (CENTRUM SILVER 50+WOMEN PO) Take 1 tablet by mouth daily.    . nitroGLYCERIN (NITROSTAT) 0.4 MG SL tablet Place 0.4 mg under the tongue every 5 (five) minutes as needed for chest pain. Reported on 02/01/2016    . Omega 3 1000 MG CAPS Take 1 each by mouth daily.     Marland Kitchen omeprazole (PRILOSEC) 40 MG capsule Take 40 mg by mouth daily.    . Prenatal Vit-Fe Fumarate-FA (PRENATAL VITAMIN PO) Take 1 tablet by mouth 2 (two) times daily.    . psyllium (METAMUCIL) 58.6 % powder Take 1 packet by mouth daily.    . rosuvastatin (CRESTOR) 10 MG tablet Take 10 mg by mouth daily.  3  . sotalol (BETAPACE) 160 MG tablet TAKE 1 TABLET (160 MG TOTAL) BY MOUTH 2 (TWO) TIMES DAILY. 60 tablet 10   No current facility-administered medications for this visit.    Facility-Administered Medications Ordered in Other Visits  Medication Dose Route Frequency Provider Last Rate Last Dose  .  heparin lock flush 100 unit/mL  500 Units Intracatheter Once PRN Alvy Bimler, Malaiyah Achorn, MD      . sodium chloride flush (NS) 0.9 % injection 10 mL  10 mL Intracatheter PRN Alvy Bimler, Elih Mooney, MD         REVIEW OF SYSTEMS:   Constitutional: Denies fevers, chills or night sweats Eyes: Denies blurriness of vision Ears, nose, mouth, throat, and face: Denies mucositis or sore throat Respiratory: Denies cough, dyspnea or  wheezes Cardiovascular: Denies palpitation, chest discomfort or lower extremity swelling Gastrointestinal:  Denies nausea, heartburn or change in bowel habits Skin: Denies abnormal skin rashes Lymphatics: Denies new lymphadenopathy or easy bruising Neurological:Denies numbness, tingling or new weaknesses Behavioral/Psych: Mood is stable, no new changes  All other systems were reviewed with the patient and are negative.  PHYSICAL EXAMINATION: ECOG PERFORMANCE STATUS: 0 - Asymptomatic  Vitals:   07/28/17 0856  BP: 132/62  Pulse: (!) 49  Resp: 18  Temp: 97.8 F (36.6 C)  SpO2: 100%   Filed Weights   07/28/17 0856  Weight: 132 lb 11.2 oz (60.2 kg)    GENERAL:alert, no distress and comfortable SKIN: skin color, texture, turgor are normal, no rashes or significant lesions EYES: normal, Conjunctiva are pink and non-injected, sclera clear OROPHARYNX:no exudate, no erythema and lips, buccal mucosa, and tongue normal  NECK: supple, thyroid normal size, non-tender, without nodularity LYMPH:  no palpable lymphadenopathy in the cervical, axillary or inguinal LUNGS: clear to auscultation and percussion with normal breathing effort HEART: regular rate & rhythm and no murmurs and no lower extremity edema ABDOMEN:abdomen soft, non-tender and normal bowel sounds Musculoskeletal:no cyanosis of digits and no clubbing  NEURO: alert & oriented x 3 with fluent speech, no focal motor/sensory deficits  LABORATORY DATA:  I have reviewed the data as listed     Component Value Date/Time   NA 137 07/11/2017 0933   NA 142 05/05/2017 0850   K 4.3 07/11/2017 0933   K 4.0 05/05/2017 0850   CL 104 07/11/2017 0933   CL 104 08/02/2012 1515   CO2 24 07/11/2017 0933   CO2 25 05/05/2017 0850   GLUCOSE 110 (H) 07/11/2017 0933   GLUCOSE 130 05/05/2017 0850   GLUCOSE 105 (H) 08/02/2012 1515   BUN 17 07/11/2017 0933   BUN 18.9 05/05/2017 0850   CREATININE 0.97 07/11/2017 0933   CREATININE 0.9  05/05/2017 0850   CALCIUM 9.7 07/11/2017 0933   CALCIUM 10.0 05/05/2017 0850   PROT 6.9 05/05/2017 0850   ALBUMIN 3.8 05/05/2017 0850   AST 19 05/05/2017 0850   ALT 14 05/05/2017 0850   ALKPHOS 54 05/05/2017 0850   BILITOT 0.48 05/05/2017 0850   GFRNONAA 53 (L) 07/11/2017 0933   GFRAA >60 07/11/2017 0933    No results found for: SPEP, UPEP  Lab Results  Component Value Date   WBC 4.7 07/28/2017   NEUTROABS 2.1 07/28/2017   HGB 12.4 07/28/2017   HCT 36.8 07/28/2017   MCV 86.4 07/28/2017   PLT 111 (L) 07/28/2017      Chemistry      Component Value Date/Time   NA 137 07/11/2017 0933   NA 142 05/05/2017 0850   K 4.3 07/11/2017 0933   K 4.0 05/05/2017 0850   CL 104 07/11/2017 0933   CL 104 08/02/2012 1515   CO2 24 07/11/2017 0933   CO2 25 05/05/2017 0850   BUN 17 07/11/2017 0933   BUN 18.9 05/05/2017 0850   CREATININE 0.97 07/11/2017 0933   CREATININE 0.9  05/05/2017 0850      Component Value Date/Time   CALCIUM 9.7 07/11/2017 0933   CALCIUM 10.0 05/05/2017 0850   ALKPHOS 54 05/05/2017 0850   AST 19 05/05/2017 0850   ALT 14 05/05/2017 0850   BILITOT 0.48 05/05/2017 0850      ASSESSMENT & PLAN:  Idiopathic thrombocytopenic purpura (HCC) The patient has recurrent ITP, with poor tolerance to steroid treatment, subsequently had complete response to IVIG We discussed premedication before IVIG She is instructed to take round-the-clock Tylenol for the next 2 days to prevent idiosyncratic reactions to IVIG We discussed other treatment options but the patient is not willing to consider She is open to the suggestion of coming here monthly for IVIG support Given her age and prior history of stroke, I also discussed possibility of getting IVIG at home through partnership with advance home care but the patient declined I plan to see her back again next month with her next infusion   No orders of the defined types were placed in this encounter.   All questions were  answered. The patient knows to call the clinic with any problems, questions or concerns. No barriers to learning was detected.  I spent 6 minutes counseling the patient face to face. The total time spent in the appointment was 10 minutes and more than 50% was on counseling.     Heath Lark, MD 11/9/201812:28 PM

## 2017-07-28 NOTE — Telephone Encounter (Signed)
Scheduled appt per 11/9 los - Gave patient AVS and calender per los.  

## 2017-07-28 NOTE — Patient Instructions (Signed)

## 2017-07-28 NOTE — Assessment & Plan Note (Signed)
The patient has recurrent ITP, with poor tolerance to steroid treatment, subsequently had complete response to IVIG We discussed premedication before IVIG She is instructed to take round-the-clock Tylenol for the next 2 days to prevent idiosyncratic reactions to IVIG We discussed other treatment options but the patient is not willing to consider She is open to the suggestion of coming here monthly for IVIG support Given her age and prior history of stroke, I also discussed possibility of getting IVIG at home through partnership with advance home care but the patient declined I plan to see her back again next month with her next infusion

## 2017-08-22 NOTE — Progress Notes (Signed)
Cardiology Office Note    Date:  08/23/2017   ID:  [name], DOB 02/11/1933, MRN [id number]  PCP:  [name], MD  Cardiologist: Sinclair Grooms, MD   Chief Complaint  Patient presents with  . Congestive Heart Failure    History of Present Illness:  [name] is a 83 y.o. female who presents for HOCM, septal myectomy 2010, CABG 2010, and MV repair 2010. Has h/o PAF and not on anticoagulation due to prior intracranial hemorrhage.   Doing well.  She has no cardiopulmonary complaints.  She is active.  She denies orthopnea, PND, and edema.  She has not had palpitations or syncope.   Past Medical History:  Diagnosis Date  . Bilateral hearing loss   . CAD (coronary artery disease) 2003   on Plavix.   . CVA (cerebral vascular accident) ([place]) 2010  . Fatigue   . Gross hematuria 07/03/2015  . Hemorrhagic stroke ([place]) 2010  . Hiatal hernia   . Hypercholesteremia   . ITP (idiopathic thrombocytopenic purpura) 01/04/2016  . Kidney stones   . Osteoporosis   . Pancreatitis 1970  . Pancreatitis   . Thrombocytopenia ([place])   . Unspecified deficiency anemia     Past Surgical History:  Procedure Laterality Date  . APPENDECTOMY    . CARDIAC VALVE SURGERY  2010  . CATARACT EXTRACTION  2017   x 2  . COLONOSCOPY     [facility], Dr. Wynetta Emery  . COLONOSCOPY WITH PROPOFOL N/A 04/09/2013   Procedure: COLONOSCOPY WITH PROPOFOL;  Surgeon: Garlan Fair, MD;  Location: [facility];  Service: Endoscopy;  Laterality: N/A;  . CORONARY ARTERY BYPASS GRAFT  06/2009  . FLEXIBLE SIGMOIDOSCOPY N/A 08/03/2015   Procedure: FLEXIBLE SIGMOIDOSCOPY;  Surgeon: Garlan Fair, MD;  Location: [facility];  Service: Endoscopy;  Laterality: N/A;  . IR FLUORO GUIDE PORT INSERTION RIGHT  07/11/2017  . IR US GUIDE VASC ACCESS RIGHT  07/11/2017  . TUBAL LIGATION      Current Medications: Outpatient Medications Prior to Visit  Medication Sig Dispense Refill  . amLODipine (NORVASC) 5  MG tablet Take 1 tablet (5 mg total) by mouth daily. 90 tablet 3  . calcium citrate (CALCITRATE - DOSED IN MG ELEMENTAL CALCIUM) 950 MG tablet Take 1 tablet by mouth daily.    . cetirizine (ZYRTEC) 10 MG tablet Take 10 mg by mouth daily.    . Cholecalciferol (VITAMIN D-3 PO) Take 1 tablet by mouth daily.    Marland Kitchen EPINEPHrine (ADRENALIN) 1 MG/ML injection Inject 1 mg as directed as needed for anaphylaxis. Reported on 02/01/2016    . ezetimibe (ZETIA) 10 MG tablet Take 1 tablet (10 mg total) by mouth daily. 30 tablet 6  . lidocaine-prilocaine (EMLA) cream Apply 1 application topically as needed. 30 g 6  . Lysine 500 MG CAPS Take 1 capsule by mouth 2 (two) times daily as needed (fever blisters).     . meclizine (ANTIVERT) 25 MG tablet Take 25 mg by mouth daily as needed for dizziness.     . Multiple Vitamins-Minerals (CENTRUM SILVER 50+WOMEN PO) Take 1 tablet by mouth daily.    . nitroGLYCERIN (NITROSTAT) 0.4 MG SL tablet Place 0.4 mg under the tongue every 5 (five) minutes as needed for chest pain. Reported on 02/01/2016    . Omega 3 1000 MG CAPS Take 1 each by mouth daily.     Marland Kitchen omeprazole (PRILOSEC) 40 MG capsule Take 40 mg by mouth daily.    Marland Kitchen  Prenatal Vit-Fe Fumarate-FA (PRENATAL VITAMIN PO) Take 1 tablet by mouth 2 (two) times daily.    . psyllium (METAMUCIL) 58.6 % powder Take 1 packet by mouth daily.    . rosuvastatin (CRESTOR) 10 MG tablet Take 10 mg by mouth daily.  3  . sotalol (BETAPACE) 160 MG tablet TAKE 1 TABLET (160 MG TOTAL) BY MOUTH 2 (TWO) TIMES DAILY. 60 tablet 10   No facility-administered medications prior to visit.      Allergies:   Bee venom; Iodides; Lyrica [pregabalin]; Codeine; Demerol [meperidine]; Ambien [zolpidem tartrate]; Aspirin; Celebrex [celecoxib]; Darvocet [propoxyphene n-acetaminophen]; Fosamax [alendronate sodium]; Lipitor [atorvastatin]; Morphine and related; Valium [diazepam]; and Iodine   Social History   Socioeconomic History  . Marital status: Married     Spouse name: Ed  . Number of children: 2  . Years of education: College  . Highest education level: None  Social Needs  . Financial resource strain: None  . Food insecurity - worry: None  . Food insecurity - inability: None  . Transportation needs - medical: None  . Transportation needs - non-medical: None  Occupational History    Comment: retired Estate manager/land agent  Tobacco Use  . Smoking status: Never Smoker  . Smokeless tobacco: Never Used  Substance and Sexual Activity  . Alcohol use: No  . Drug use: No  . Sexual activity: None  Other Topics Concern  . None  Social History Narrative   Pt lives at home with spouse.   Caffeine Use: Rarely     Family History:  The patient's family history includes Cancer in her brother and sister; Cancer (age of onset: 65) in her mother; Dementia in her brother; Heart attack in her father; Memory loss in her sister; Stroke in her sister.   ROS:   Please see the history of present illness.    Severe memory issues since intracranial hemorrhage. All other systems reviewed and are negative.   PHYSICAL EXAM:   VS:  BP (!) 144/60   Pulse (!) 49   Ht 5\' 2"  (1.575 m)   Wt 135 lb 12.8 oz (61.6 kg)   BMI 24.84 kg/m    GEN: Well nourished, well developed, in no acute distress  HEENT: normal  Neck: no JVD, carotid bruits, or masses Cardiac: RRR; no murmurs, rubs, or gallops,no edema  Respiratory:  clear to auscultation bilaterally, normal work of breathing GI: soft, nontender, nondistended, + BS MS: no deformity or atrophy  Skin: warm and dry, no rash Neuro:  Alert and Oriented x 3, Strength and sensation are intact Psych: euthymic mood, full affect  Wt Readings from Last 3 Encounters:  08/23/17 135 lb 12.8 oz (61.6 kg)  07/28/17 132 lb 11.2 oz (60.2 kg)  07/11/17 133 lb 3.2 oz (60.4 kg)      Studies/Labs Reviewed:   EKG:  EKG sinus bradycardia, poor R wave progression, evidence of prior septal infarct.  When compared to prior tracings,  but bradycardia is progressive.  Left axis deviation.  Recent Labs: 05/05/2017: ALT 14 07/11/2017: BUN 17; Creatinine, Ser 0.97; Potassium 4.3; Sodium 137 07/28/2017: HGB 12.4; Platelets 111   Lipid Panel    Component Value Date/Time   CHOL 125 09/29/2016 0915   TRIG 143 09/29/2016 0915   HDL 46 09/29/2016 0915   CHOLHDL 2.7 09/29/2016 0915   CHOLHDL 3.9 07/22/2009 0945   VLDL 16 07/22/2009 0945   LDLCALC 50 09/29/2016 0915    Additional studies/ records that were reviewed today include:  2016 echocardiogram:  Study Conclusions   - Left ventricle: The cavity size was normal. Wall thickness was   increased in a pattern of mild LVH. S/p septal myectomy. No   apparent residual LV outflow tract gradient. Systolic function   was normal. The estimated ejection fraction was in the range of   60% to 65%. Wall motion was normal; there were no regional wall   motion abnormalities. Doppler parameters are consistent with   abnormal left ventricular relaxation (grade 1 diastolic   dysfunction). - Aortic valve: There was no stenosis. There was trivial   regurgitation. - Mitral valve: S/p MV repair. There is chordal SAM. There was no   evidence for stenosis. There was mild regurgitation. Mean   gradient (D): 2 mm Hg. - Left atrium: The atrium was mildly dilated. - Right ventricle: The cavity size was normal. Systolic function   was normal. - Tricuspid valve: Peak RV-RA gradient (S): 15 mm Hg. - Pulmonary arteries: PA peak pressure: 18 mm Hg (S). - Inferior vena cava: The vessel was normal in size. The   respirophasic diameter changes were in the normal range (= 50%),   consistent with normal central venous pressure.   Impressions:   - Normal LV size with mild LV hypertrophy (s/p septal myectomy, no   apparent residual significant LV outflow tract gradient). EF   60-65%. Normal RV size and systolic function. Status post mitral   valve repair with mild MR and no significant mitral  stenosis.       ASSESSMENT:    1. Hypertrophic obstructive cardiomyopathy (HCC)   2. Paroxysmal atrial fibrillation ([place])   3. Bradycardia   4. S/P mitral valve repair   5. 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 Hyperlipidemia LDL goal <70      PLAN:  In order of problems listed above:  1. Stable without clinical features of disease progression. 2. No clinical recurrences. 3. A combination of sotalol therapy and underlying substrate.  24-hour Holter to exclude excessive bradycardia.  May need to make a downward adjustment of sotalol therapy. 4. No mitral regurgitation or auscultation.  Please see prior echo from 2016.  May need a repeat echo sometime in the next year or 2 or if clinical features of dysfunction develop. 5. No change in therapy.  Significant residual memory difficulty. 6. LDL target less than 70.  Clinical follow-up with in 1 year.  Call if symptoms are.  Call neurological complaints.  Medication Adjustments/Labs and Tests Ordered: Current medicines are reviewed at length with the patient today.  Concerns regarding medicines are outlined above.  Medication changes, Labs and Tests ordered today are listed in the Patient Instructions below. Patient Instructions  Medication Instructions:  Your physician recommends that you continue on your current medications as directed. Please refer to the Current Medication list given to you today.  Labwork: None  Testing/Procedures: Your physician has recommended that you wear a 24 hour holter monitor. Holter monitors are medical devices that record the heart's electrical activity. Doctors most often use these monitors to diagnose arrhythmias. Arrhythmias are problems with the speed or rhythm of the heartbeat. The monitor is a small, portable device. You can wear one while you do your normal daily activities. This is usually used to diagnose what is causing palpitations/syncope (passing  out).   Follow-Up: Your physician wants you to follow-up in: 1 year with Dr. Tamala Julian.  You will receive a reminder letter in the mail two months in advance.  If you don't receive a letter, please call our office to schedule the follow-up appointment.   Any Other Special Instructions Will Be Listed Below (If Applicable).     If you need a refill on your cardiac medications before your next appointment, please call your pharmacy.      Signed, Sinclair Grooms, MD  08/23/2017 8:46 AM    [facility] [address], [place], [place]  [address] Phone: [phone]; Fax: [phone]

## 2017-08-23 ENCOUNTER — Encounter (INDEPENDENT_AMBULATORY_CARE_PROVIDER_SITE_OTHER): Payer: Self-pay

## 2017-08-23 ENCOUNTER — Encounter: Payer: Self-pay | Admitting: Interventional Cardiology

## 2017-08-23 ENCOUNTER — Ambulatory Visit: Payer: Medicare Other | Admitting: Interventional Cardiology

## 2017-08-23 ENCOUNTER — Telehealth: Payer: Self-pay | Admitting: Interventional Cardiology

## 2017-08-23 VITALS — BP 144/60 | HR 49 | Ht 62.0 in | Wt 135.8 lb

## 2017-08-23 DIAGNOSIS — I421 Obstructive hypertrophic cardiomyopathy: Secondary | ICD-10-CM

## 2017-08-23 DIAGNOSIS — R001 Bradycardia, unspecified: Secondary | ICD-10-CM

## 2017-08-23 DIAGNOSIS — E785 Hyperlipidemia, unspecified: Secondary | ICD-10-CM | POA: Diagnosis not present

## 2017-08-23 DIAGNOSIS — I480 Paroxysmal atrial fibrillation: Secondary | ICD-10-CM | POA: Diagnosis not present

## 2017-08-23 DIAGNOSIS — I6302 Cerebral infarction due to thrombosis of basilar artery: Secondary | ICD-10-CM | POA: Diagnosis not present

## 2017-08-23 DIAGNOSIS — Z9889 Other specified postprocedural states: Secondary | ICD-10-CM | POA: Diagnosis not present

## 2017-08-23 NOTE — Telephone Encounter (Signed)
New Message     Patient could not stay today to have monitor put on, she is available tomorrow for a small time frame, have Dr Tamala Julian call her , she is not going to way to next available appt on 12/18

## 2017-08-23 NOTE — Patient Instructions (Signed)
Medication Instructions:  Your physician recommends that you continue on your current medications as directed. Please refer to the Current Medication list given to you today.  Labwork: None  Testing/Procedures: Your physician has recommended that you wear a 24 hour holter monitor. Holter monitors are medical devices that record the heart's electrical activity. Doctors most often use these monitors to diagnose arrhythmias. Arrhythmias are problems with the speed or rhythm of the heartbeat. The monitor is a small, portable device. You can wear one while you do your normal daily activities. This is usually used to diagnose what is causing palpitations/syncope (passing out).    Follow-Up: Your physician wants you to follow-up in: 1 year with Dr. Smith.  You will receive a reminder letter in the mail two months in advance. If you don't receive a letter, please call our office to schedule the follow-up appointment.  Any Other Special Instructions Will Be Listed Below (If Applicable).     If you need a refill on your cardiac medications before your next appointment, please call your pharmacy.   

## 2017-08-23 NOTE — Telephone Encounter (Signed)
Spoke with pt and she states that she can come tomorrow to have monitor placed.  Spoke with [name] at check out and she asked that I transfer pt so she can get her scheduled.  Pt will come tomorrow and have holter monitor placed.  Pt in agreement to be transferred to be scheduled.  Appt made for 12/6 at 930A.

## 2017-08-24 ENCOUNTER — Ambulatory Visit (INDEPENDENT_AMBULATORY_CARE_PROVIDER_SITE_OTHER): Payer: Medicare Other

## 2017-08-24 DIAGNOSIS — R001 Bradycardia, unspecified: Secondary | ICD-10-CM

## 2017-08-25 ENCOUNTER — Other Ambulatory Visit (HOSPITAL_BASED_OUTPATIENT_CLINIC_OR_DEPARTMENT_OTHER): Payer: Medicare Other

## 2017-08-25 ENCOUNTER — Ambulatory Visit (HOSPITAL_BASED_OUTPATIENT_CLINIC_OR_DEPARTMENT_OTHER): Payer: Medicare Other

## 2017-08-25 ENCOUNTER — Ambulatory Visit: Payer: Medicare Other

## 2017-08-25 ENCOUNTER — Telehealth: Payer: Self-pay | Admitting: Hematology and Oncology

## 2017-08-25 ENCOUNTER — Encounter: Payer: Self-pay | Admitting: Hematology and Oncology

## 2017-08-25 ENCOUNTER — Ambulatory Visit (HOSPITAL_BASED_OUTPATIENT_CLINIC_OR_DEPARTMENT_OTHER): Payer: Medicare Other | Admitting: Hematology and Oncology

## 2017-08-25 VITALS — BP 112/63 | HR 50 | Temp 98.4°F | Resp 18

## 2017-08-25 DIAGNOSIS — D693 Immune thrombocytopenic purpura: Secondary | ICD-10-CM | POA: Diagnosis not present

## 2017-08-25 DIAGNOSIS — I421 Obstructive hypertrophic cardiomyopathy: Secondary | ICD-10-CM

## 2017-08-25 DIAGNOSIS — Z95828 Presence of other vascular implants and grafts: Secondary | ICD-10-CM

## 2017-08-25 LAB — CBC WITH DIFFERENTIAL/PLATELET
BASO%: 0.2 % (ref 0.0–2.0)
BASOS ABS: 0 10*3/uL (ref 0.0–0.1)
EOS ABS: 0.1 10*3/uL (ref 0.0–0.5)
EOS%: 1.6 % (ref 0.0–7.0)
HCT: 35.8 % (ref 34.8–46.6)
HGB: 11.8 g/dL (ref 11.6–15.9)
LYMPH%: 40.6 % (ref 14.0–49.7)
MCH: 29.4 pg (ref 25.1–34.0)
MCHC: 33 g/dL (ref 31.5–36.0)
MCV: 89.3 fL (ref 79.5–101.0)
MONO#: 0.6 10*3/uL (ref 0.1–0.9)
MONO%: 14.8 % — AB (ref 0.0–14.0)
NEUT%: 42.8 % (ref 38.4–76.8)
NEUTROS ABS: 1.8 10*3/uL (ref 1.5–6.5)
PLATELETS: 111 10*3/uL — AB (ref 145–400)
RBC: 4.01 10*6/uL (ref 3.70–5.45)
RDW: 14.3 % (ref 11.2–14.5)
WBC: 4.3 10*3/uL (ref 3.9–10.3)
lymph#: 1.8 10*3/uL (ref 0.9–3.3)

## 2017-08-25 MED ORDER — FAMOTIDINE 20 MG PO TABS
ORAL_TABLET | ORAL | Status: AC
  Filled 2017-08-25: qty 1

## 2017-08-25 MED ORDER — DIPHENHYDRAMINE HCL 25 MG PO TABS
25.0000 mg | ORAL_TABLET | Freq: Once | ORAL | Status: AC
  Administered 2017-08-25: 25 mg via ORAL
  Filled 2017-08-25: qty 1

## 2017-08-25 MED ORDER — SODIUM CHLORIDE 0.9% FLUSH
10.0000 mL | Freq: Once | INTRAVENOUS | Status: AC
  Administered 2017-08-25: 10 mL
  Filled 2017-08-25: qty 10

## 2017-08-25 MED ORDER — HEPARIN SOD (PORK) LOCK FLUSH 100 UNIT/ML IV SOLN
250.0000 [IU] | Freq: Once | INTRAVENOUS | Status: DC
Start: 2017-08-25 — End: 2017-08-25
  Filled 2017-08-25: qty 5

## 2017-08-25 MED ORDER — FAMOTIDINE 20 MG PO TABS
20.0000 mg | ORAL_TABLET | Freq: Once | ORAL | Status: AC
  Administered 2017-08-25: 20 mg via ORAL

## 2017-08-25 MED ORDER — DEXTROSE 5 % IV SOLN
Freq: Once | INTRAVENOUS | Status: AC
  Administered 2017-08-25: 10:00:00 via INTRAVENOUS

## 2017-08-25 MED ORDER — DIPHENHYDRAMINE HCL 25 MG PO CAPS
ORAL_CAPSULE | ORAL | Status: AC
  Filled 2017-08-25: qty 1

## 2017-08-25 MED ORDER — HEPARIN SOD (PORK) LOCK FLUSH 100 UNIT/ML IV SOLN
250.0000 [IU] | Freq: Once | INTRAVENOUS | Status: DC | PRN
  Filled 2017-08-25: qty 5

## 2017-08-25 MED ORDER — IMMUNE GLOBULIN (HUMAN) 20 GM/200ML IV SOLN
1.0000 g/kg | Freq: Once | INTRAVENOUS | Status: AC
  Administered 2017-08-25: 60 g via INTRAVENOUS
  Filled 2017-08-25: qty 600

## 2017-08-25 MED ORDER — HEPARIN SOD (PORK) LOCK FLUSH 100 UNIT/ML IV SOLN
500.0000 [IU] | Freq: Once | INTRAVENOUS | Status: AC
  Administered 2017-08-25: 500 [IU]
  Filled 2017-08-25: qty 5

## 2017-08-25 MED ORDER — SODIUM CHLORIDE 0.9% FLUSH
3.0000 mL | Freq: Once | INTRAVENOUS | Status: DC | PRN
  Filled 2017-08-25: qty 10

## 2017-08-25 MED ORDER — ALTEPLASE 2 MG IJ SOLR
2.0000 mg | Freq: Once | INTRAMUSCULAR | Status: DC | PRN
  Filled 2017-08-25: qty 2

## 2017-08-25 MED ORDER — HEPARIN SOD (PORK) LOCK FLUSH 100 UNIT/ML IV SOLN
250.0000 [IU] | Freq: Once | INTRAVENOUS | Status: DC
  Filled 2017-08-25: qty 5

## 2017-08-25 MED ORDER — ACETAMINOPHEN 325 MG PO TABS
ORAL_TABLET | ORAL | Status: AC
  Filled 2017-08-25: qty 2

## 2017-08-25 MED ORDER — HEPARIN SOD (PORK) LOCK FLUSH 100 UNIT/ML IV SOLN
500.0000 [IU] | Freq: Once | INTRAVENOUS | Status: DC
Start: 2017-08-25 — End: 2017-08-25
  Filled 2017-08-25: qty 5

## 2017-08-25 MED ORDER — ACETAMINOPHEN 325 MG PO TABS
650.0000 mg | ORAL_TABLET | Freq: Once | ORAL | Status: AC
  Administered 2017-08-25: 650 mg via ORAL

## 2017-08-25 NOTE — Assessment & Plan Note (Signed)
She is undergoing cardiac evaluation Examination today is satisfactory and he does not review any signs of congestive heart failure I would defer to her cardiologist for further management

## 2017-08-25 NOTE — Progress Notes (Signed)
[facility] OFFICE PROGRESS NOTE  Adrienne Ada, MD SUMMARY OF HEMATOLOGIC HISTORY:  This is a pleasant lady with chronic thrombocytopenia. The patient was discovered to have low platelet count when she was placed on Plavix for coronary artery disease. 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 She is being observed. On 01/04/2016, she was started on prednisone therapy for ITP when her platelet count dip under  50,000 May 2017, she tolerated prednisone poorly and prednisone is slowly tapered off 05/05/2017, she had relapsed ITP and started on IVIG with complete response to Rx but with platelet count trending down after 4 weeks. She is then scheduled for IVIG monthly INTERVAL HISTORY: [name] 83 y.o. female returns for further follow-up. She feels fine. She denies infusion reaction with the last treatment Denies recent infection The patient denies any recent signs or symptoms of bleeding such as spontaneous epistaxis, hematuria or hematochezia. She is undergoing cardiac evaluation with a cardiac monitor She denies chest pain, shortness of breath or leg swelling  I have reviewed the past medical history, past surgical history, social history and family history with the patient and they are unchanged from previous note.  ALLERGIES:  is allergic to bee venom; iodides; lyrica [pregabalin]; codeine; demerol [meperidine]; ambien [zolpidem tartrate]; aspirin; celebrex [celecoxib]; darvocet [propoxyphene n-acetaminophen]; fosamax [alendronate sodium]; lipitor [atorvastatin]; morphine and related; valium [diazepam]; and iodine.  MEDICATIONS:  Current Outpatient Medications  Medication Sig Dispense Refill  . amLODipine (NORVASC) 5 MG tablet Take 1 tablet (5 mg total) by mouth daily. 90 tablet 3  . calcium  citrate (CALCITRATE - DOSED IN MG ELEMENTAL CALCIUM) 950 MG tablet Take 1 tablet by mouth daily.    . cetirizine (ZYRTEC) 10 MG tablet Take 10 mg by mouth daily.    . Cholecalciferol (VITAMIN D-3 PO) Take 1 tablet by mouth daily.    Marland Kitchen EPINEPHrine (ADRENALIN) 1 MG/ML injection Inject 1 mg as directed as needed for anaphylaxis. Reported on 02/01/2016    . ezetimibe (ZETIA) 10 MG tablet Take 1 tablet (10 mg total) by mouth daily. 30 tablet 6  . lidocaine-prilocaine (EMLA) cream Apply 1 application topically as needed. 30 g 6  . Lysine 500 MG CAPS Take 1 capsule by mouth 2 (two) times daily as needed (fever blisters).     . meclizine (ANTIVERT) 25 MG tablet Take 25 mg by mouth daily as needed for dizziness.     . Multiple Vitamins-Minerals (CENTRUM SILVER 50+WOMEN PO) Take 1 tablet by mouth daily.    . nitroGLYCERIN (NITROSTAT) 0.4 MG SL tablet Place 0.4 mg under the tongue every 5 (five) minutes as needed for chest pain. Reported on 02/01/2016    . Omega 3 1000 MG CAPS Take 1 each by mouth daily.     Marland Kitchen omeprazole (PRILOSEC) 40 MG capsule Take 40 mg by mouth daily.    . Prenatal Vit-Fe Fumarate-FA (PRENATAL VITAMIN PO) Take 1 tablet by mouth 2 (two) times daily.    . psyllium (METAMUCIL) 58.6 % powder Take 1 packet by mouth daily.    . rosuvastatin (CRESTOR) 10 MG tablet Take 10 mg by mouth daily.  3  . sotalol (BETAPACE) 160 MG tablet TAKE 1 TABLET (160 MG TOTAL) BY MOUTH 2 (TWO) TIMES DAILY. 60 tablet 10   No current facility-administered medications for this visit.  REVIEW OF SYSTEMS:   Constitutional: Denies fevers, chills or night sweats Eyes: Denies blurriness of vision Ears, nose, mouth, throat, and face: Denies mucositis or sore throat Respiratory: Denies cough, dyspnea or wheezes Cardiovascular: Denies palpitation, chest discomfort or lower extremity swelling Gastrointestinal:  Denies nausea, heartburn or change in bowel habits Skin: Denies abnormal skin rashes Lymphatics: Denies  new lymphadenopathy or easy bruising Neurological:Denies numbness, tingling or new weaknesses Behavioral/Psych: Mood is stable, no new changes  All other systems were reviewed with the patient and are negative.  PHYSICAL EXAMINATION: ECOG PERFORMANCE STATUS: 1 - Symptomatic but completely ambulatory  Vitals:   08/25/17 0907  BP: 138/60  Pulse: (!) 55  Resp: 20  Temp: 98.1 F (36.7 C)  SpO2: 100%   Filed Weights   08/25/17 0907  Weight: 135 lb 9.6 oz (61.5 kg)    GENERAL:alert, no distress and comfortable SKIN: skin color, texture, turgor are normal, no rashes or significant lesions EYES: normal, Conjunctiva are pink and non-injected, sclera clear OROPHARYNX:no exudate, no erythema and lips, buccal mucosa, and tongue normal  NECK: supple, thyroid normal size, non-tender, without nodularity LYMPH:  no palpable lymphadenopathy in the cervical, axillary or inguinal LUNGS: clear to auscultation and percussion with normal breathing effort HEART: regular rate & rhythm, soft systoic murmurs and no lower extremity edema ABDOMEN:abdomen soft, non-tender and normal bowel sounds Musculoskeletal:no cyanosis of digits and no clubbing  NEURO: alert & oriented x 3 with fluent speech, no focal motor/sensory deficits  LABORATORY DATA:  I have reviewed the data as listed     Component Value Date/Time   NA 137 07/11/2017 0933   NA 142 05/05/2017 0850   K 4.3 07/11/2017 0933   K 4.0 05/05/2017 0850   CL 104 07/11/2017 0933   CL 104 08/02/2012 1515   CO2 24 07/11/2017 0933   CO2 25 05/05/2017 0850   GLUCOSE 110 (H) 07/11/2017 0933   GLUCOSE 130 05/05/2017 0850   GLUCOSE 105 (H) 08/02/2012 1515   BUN 17 07/11/2017 0933   BUN 18.9 05/05/2017 0850   CREATININE 0.97 07/11/2017 0933   CREATININE 0.9 05/05/2017 0850   CALCIUM 9.7 07/11/2017 0933   CALCIUM 10.0 05/05/2017 0850   PROT 6.9 05/05/2017 0850   ALBUMIN 3.8 05/05/2017 0850   AST 19 05/05/2017 0850   ALT 14 05/05/2017 0850    ALKPHOS 54 05/05/2017 0850   BILITOT 0.48 05/05/2017 0850   GFRNONAA 53 (L) 07/11/2017 0933   GFRAA >60 07/11/2017 0933    No results found for: SPEP, UPEP  Lab Results  Component Value Date   WBC 4.3 08/25/2017   NEUTROABS 1.8 08/25/2017   HGB 11.8 08/25/2017   HCT 35.8 08/25/2017   MCV 89.3 08/25/2017   PLT 111 (L) 08/25/2017      Chemistry      Component Value Date/Time   NA 137 07/11/2017 0933   NA 142 05/05/2017 0850   K 4.3 07/11/2017 0933   K 4.0 05/05/2017 0850   CL 104 07/11/2017 0933   CL 104 08/02/2012 1515   CO2 24 07/11/2017 0933   CO2 25 05/05/2017 0850   BUN 17 07/11/2017 0933   BUN 18.9 05/05/2017 0850   CREATININE 0.97 07/11/2017 0933   CREATININE 0.9 05/05/2017 0850      Component Value Date/Time   CALCIUM 9.7 07/11/2017 0933   CALCIUM 10.0 05/05/2017 0850   ALKPHOS 54 05/05/2017 0850   AST 19 05/05/2017 0850   ALT 14 05/05/2017 0850  BILITOT 0.48 05/05/2017 0850     ASSESSMENT & PLAN:  Idiopathic thrombocytopenic purpura ([place]) The patient has recurrent ITP, with poor tolerance to steroid treatment, subsequently had near complete response to IVIG We discussed premedication before IVIG She is instructed to take round-the-clock Tylenol for the next 2 days to prevent idiosyncratic reactions to IVIG We discussed other treatment options but the patient is not willing to consider She is open to the suggestion of coming here monthly for IVIG support Given her age and prior history of stroke, I also discussed possibility of getting IVIG at home through partnership with advance home care but the patient declined I plan to see her back again next month with her next infusion  Hypertrophic obstructive cardiomyopathy ([place]) She is undergoing cardiac evaluation Examination today is satisfactory and he does not review any signs of congestive heart failure I would defer to her cardiologist for further management   No orders of the defined types were  placed in this encounter.   All questions were answered. The patient knows to call the clinic with any problems, questions or concerns. No barriers to learning was detected.  I spent 15 minutes counseling the patient face to face. The total time spent in the appointment was 20 minutes and more than 50% was on counseling.     Heath Lark, MD 12/7/20189:18 AM

## 2017-08-25 NOTE — Assessment & Plan Note (Signed)
The patient has recurrent ITP, with poor tolerance to steroid treatment, subsequently had near complete response to IVIG We discussed premedication before IVIG She is instructed to take round-the-clock Tylenol for the next 2 days to prevent idiosyncratic reactions to IVIG We discussed other treatment options but the patient is not willing to consider She is open to the suggestion of coming here monthly for IVIG support Given her age and prior history of stroke, I also discussed possibility of getting IVIG at home through partnership with advance home care but the patient declined I plan to see her back again next month with her next infusion

## 2017-08-25 NOTE — Patient Instructions (Signed)

## 2017-08-25 NOTE — Telephone Encounter (Signed)
Gave avs and calendar for January 2019 °

## 2017-08-31 ENCOUNTER — Telehealth: Payer: Self-pay | Admitting: Interventional Cardiology

## 2017-08-31 NOTE — Telephone Encounter (Signed)
[name] is returning your call. Please call

## 2017-08-31 NOTE — Telephone Encounter (Signed)
Informed pt of monitor results.  Pt verbalized understanding and was appreciative for call.

## 2017-09-08 ENCOUNTER — Encounter: Payer: Self-pay | Admitting: Diagnostic Neuroimaging

## 2017-09-08 ENCOUNTER — Ambulatory Visit: Payer: Medicare Other | Admitting: Diagnostic Neuroimaging

## 2017-09-08 VITALS — BP 131/65 | HR 53 | Ht 62.0 in | Wt 136.0 lb

## 2017-09-08 DIAGNOSIS — F0390 Unspecified dementia without behavioral disturbance: Secondary | ICD-10-CM

## 2017-09-08 DIAGNOSIS — I616 Nontraumatic intracerebral hemorrhage, multiple localized: Secondary | ICD-10-CM | POA: Diagnosis not present

## 2017-09-08 DIAGNOSIS — R413 Other amnesia: Secondary | ICD-10-CM | POA: Diagnosis not present

## 2017-09-08 DIAGNOSIS — F03A0 Unspecified dementia, mild, without behavioral disturbance, psychotic disturbance, mood disturbance, and anxiety: Secondary | ICD-10-CM

## 2017-09-08 NOTE — Patient Instructions (Signed)
-   monitor symptoms

## 2017-09-08 NOTE — Progress Notes (Signed)
[facility]  PATIENT: [name] DOB: Oct 03, 1932  REFERRING CLINICIAN: Cipriano Mile, MD HISTORY FROM: patient and husband  REASON FOR VISIT: follow up   HISTORICAL  CHIEF COMPLAINT:  Chief Complaint  Patient presents with  . Follow-up  . Memory Loss    feels like she is the same.      HISTORY OF PRESENT ILLNESS:   UPDATE (09/08/17, VRP): Since last visit, doing about the same with memory loss. Tolerating her regular meds. No alleviating or aggravating factors. Husband notes some progression. Now on treatment for low platelets.   PRIOR HPI (05/08/17): 83 year old female with history of thrombocytopenia, mitral valve repair, heart stent, here for evaluation of memory loss. I previously evaluated patient in 2014 for vertigo and dizziness. Now patient referred here for memory loss.  In July 2018 patient was hospitalized for vertigo. Since that time patient has had increasing problems with short-term memory loss and confusion. She has difficult time with retaining new information.  In addition patient has had some memory loss dating back to 2010 when she had heart surgery and was diagnosed with hemorrhagic infarcts in the brain.  Patient has declined in her ability to prepare complex meals and cooking home. She does not drive. She does not take her finances. She is able to take her own personal hygiene issues.   REVIEW OF SYSTEMS: Full 14 system review of systems performed and negative with exception of: memory loss  ALLERGIES: Allergies  Allergen Reactions  . Bee Venom Anaphylaxis  . Iodides Anaphylaxis  . Lyrica [Pregabalin]   . Codeine Other (See Comments)    Reaction: hallucinate  . Demerol [Meperidine] Hives and Other (See Comments)    REACTION: Welts  . Ambien [Zolpidem Tartrate] Other (See Comments)    Altered Mental Status  . Aspirin Other (See Comments)    Bleeding, aneurysm- when she was on plavix Bleeding, aneurysm- when she was on plavix    . Celebrex [Celecoxib] Other (See Comments)    Legs hurt  . Darvocet [Propoxyphene N-Acetaminophen] Other (See Comments)    Hallucinates  . Fosamax [Alendronate Sodium] Other (See Comments)    Legs hurt  . Lipitor [Atorvastatin] Other (See Comments)    Legs hurt  . Morphine And Related Other (See Comments)    Hallucinations  . Valium [Diazepam] Other (See Comments)    Altered Mental Status  . Iodine Hives    Welts    HOME MEDICATIONS: Outpatient Medications Prior to Visit  Medication Sig Dispense Refill  . calcium citrate (CALCITRATE - DOSED IN MG ELEMENTAL CALCIUM) 950 MG tablet Take 1 tablet by mouth daily.    . cetirizine (ZYRTEC) 10 MG tablet Take 10 mg by mouth daily.    . Cholecalciferol (VITAMIN D-3 PO) Take 1 tablet by mouth daily.    Marland Kitchen EPINEPHrine (ADRENALIN) 1 MG/ML injection Inject 1 mg as directed as needed for anaphylaxis. Reported on 02/01/2016    . ezetimibe (ZETIA) 10 MG tablet Take 1 tablet (10 mg total) by mouth daily. 30 tablet 6  . lidocaine-prilocaine (EMLA) cream Apply 1 application topically as needed. 30 g 6  . Lysine 500 MG CAPS Take 1 capsule by mouth 2 (two) times daily as needed (fever blisters).     . meclizine (ANTIVERT) 25 MG tablet Take 25 mg by mouth daily as needed for dizziness.     . Multiple Vitamins-Minerals (CENTRUM SILVER 50+WOMEN PO) Take 1 tablet by mouth daily.    . nitroGLYCERIN (NITROSTAT) 0.4 MG  SL tablet Place 0.4 mg under the tongue every 5 (five) minutes as needed for chest pain. Reported on 02/01/2016    . Omega 3 1000 MG CAPS Take 1 each by mouth daily.     Marland Kitchen omeprazole (PRILOSEC) 40 MG capsule Take 40 mg by mouth daily.    . Prenatal Vit-Fe Fumarate-FA (PRENATAL VITAMIN PO) Take 1 tablet by mouth 2 (two) times daily.    . psyllium (METAMUCIL) 58.6 % powder Take 1 packet by mouth daily.    . rosuvastatin (CRESTOR) 10 MG tablet Take 10 mg by mouth daily.  3  . sotalol (BETAPACE) 160 MG tablet TAKE 1 TABLET (160 MG TOTAL) BY MOUTH 2  (TWO) TIMES DAILY. 60 tablet 10  . amLODipine (NORVASC) 5 MG tablet Take 1 tablet (5 mg total) by mouth daily. 90 tablet 3   No facility-administered medications prior to visit.     PAST MEDICAL HISTORY: Past Medical History:  Diagnosis Date  . Bilateral hearing loss   . CAD (coronary artery disease) 2003   on Plavix.   . CVA (cerebral vascular accident) ([place]) 2010  . Fatigue   . Gross hematuria 07/03/2015  . Hemorrhagic stroke ([place]) 2010  . Hiatal hernia   . Hypercholesteremia   . ITP (idiopathic thrombocytopenic purpura) 01/04/2016  . Kidney stones   . Osteoporosis   . Pancreatitis 1970  . Pancreatitis   . Thrombocytopenia ([place])   . Unspecified deficiency anemia     PAST SURGICAL HISTORY: Past Surgical History:  Procedure Laterality Date  . APPENDECTOMY    . CARDIAC VALVE SURGERY  2010  . CATARACT EXTRACTION  2017   x 2  . COLONOSCOPY     [facility], Dr. Wynetta Emery  . COLONOSCOPY WITH PROPOFOL N/A 04/09/2013   Procedure: COLONOSCOPY WITH PROPOFOL;  Surgeon: Garlan Fair, MD;  Location: [facility];  Service: Endoscopy;  Laterality: N/A;  . CORONARY ARTERY BYPASS GRAFT  06/2009  . FLEXIBLE SIGMOIDOSCOPY N/A 08/03/2015   Procedure: FLEXIBLE SIGMOIDOSCOPY;  Surgeon: Garlan Fair, MD;  Location: [facility];  Service: Endoscopy;  Laterality: N/A;  . IR FLUORO GUIDE PORT INSERTION RIGHT  07/11/2017  . IR US GUIDE VASC ACCESS RIGHT  07/11/2017  . TUBAL LIGATION      FAMILY HISTORY: Family History  Problem Relation Age of Onset  . Cancer Mother 11       colon  . Heart attack Father   . Cancer Sister        colon, cervical  . Stroke Sister   . Memory loss Sister   . Cancer Brother        lung  . Dementia Brother     SOCIAL HISTORY:  Social History   Socioeconomic History  . Marital status: Married    Spouse name: Ed  . Number of children: 2  . Years of education: College  . Highest education level: Not on file  Social Needs  . Financial resource  strain: Not on file  . Food insecurity - worry: Not on file  . Food insecurity - inability: Not on file  . Transportation needs - medical: Not on file  . Transportation needs - non-medical: Not on file  Occupational History    Comment: retired Network engineer [employer]  Tobacco Use  . Smoking status: Never Smoker  . Smokeless tobacco: Never Used  Substance and Sexual Activity  . Alcohol use: No  . Drug use: No  . Sexual activity: Not on file  Other Topics Concern  .  Not on file  Social History Narrative   Pt lives at home with spouse.   Caffeine Use: Rarely     PHYSICAL EXAM  GENERAL EXAM/CONSTITUTIONAL: Vitals:  Vitals:   09/08/17 0926  BP: 131/65  Pulse: (!) 53  Weight: 136 lb (61.7 kg)  Height: 5\' 2"  (1.575 m)   Body mass index is 24.87 kg/m. No exam data present  Patient is in no distress; well developed, nourished and groomed; neck is supple  CARDIOVASCULAR:  Examination of carotid arteries is normal; no carotid bruits  Regular rate and rhythm, no murmurs  Examination of peripheral vascular system by observation and palpation is normal  EYES:  Ophthalmoscopic exam of optic discs and posterior segments is normal; no papilledema or hemorrhages  MUSCULOSKELETAL:  Gait, strength, tone, movements noted in Neurologic exam below  NEUROLOGIC: MENTAL STATUS:  MMSE - [place] Exam 09/08/2017 05/08/2017  Orientation to time 4 4  Orientation to Place 3 4  Registration 3 3  Attention/ Calculation 0 1  Recall 1 0  Language- name 2 objects 2 2  Language- repeat 1 0  Language- follow 3 step command 3 3  Language- read & follow direction 1 1  Write a sentence 1 1  Copy design 0 0  Total score 19 19    awake, alert, oriented to person, place and time; NOT DATE  DECR memory  DECR attention and concentration  language fluent, comprehension intact, naming intact,   fund of knowledge appropriate  CRANIAL NERVE:   2nd - no papilledema on fundoscopic  exam  2nd, 3rd, 4th, 6th - pupils equal and reactive to light, visual fields full to confrontation, extraocular muscles intact, no nystagmus  5th - facial sensation symmetric  7th - facial strength symmetric  8th - hearing intact  9th - palate elevates symmetrically, uvula midline  11th - shoulder shrug symmetric  12th - tongue protrusion midline  MOTOR:   normal bulk and tone, full strength in the BUE, BLE  SENSORY:   normal and symmetric to light touch, temperature, vibration  COORDINATION:   finger-nose-finger, fine finger movements normal  REFLEXES:   deep tendon reflexes present and symmetric  POSITIVE PALMOMENTAL REFLEX; POSITIVE MYERSON'S REFLEX; NEG SNOUT; NEG ROOTING  GAIT/STATION:   narrow based gait; DECR RIGHT ARM SWING    DIAGNOSTIC DATA (LABS, IMAGING, TESTING) - I reviewed patient records, labs, notes, testing and imaging myself where available.  Lab Results  Component Value Date   WBC 4.3 08/25/2017   HGB 11.8 08/25/2017   HCT 35.8 08/25/2017   MCV 89.3 08/25/2017   PLT 111 (L) 08/25/2017      Component Value Date/Time   NA 137 07/11/2017 0933   NA 142 05/05/2017 0850   K 4.3 07/11/2017 0933   K 4.0 05/05/2017 0850   CL 104 07/11/2017 0933   CL 104 08/02/2012 1515   CO2 24 07/11/2017 0933   CO2 25 05/05/2017 0850   GLUCOSE 110 (H) 07/11/2017 0933   GLUCOSE 130 05/05/2017 0850   GLUCOSE 105 (H) 08/02/2012 1515   BUN 17 07/11/2017 0933   BUN 18.9 05/05/2017 0850   CREATININE 0.97 07/11/2017 0933   CREATININE 0.9 05/05/2017 0850   CALCIUM 9.7 07/11/2017 0933   CALCIUM 10.0 05/05/2017 0850   PROT 6.9 05/05/2017 0850   ALBUMIN 3.8 05/05/2017 0850   AST 19 05/05/2017 0850   ALT 14 05/05/2017 0850   ALKPHOS 54 05/05/2017 0850   BILITOT 0.48 05/05/2017 0850  GFRNONAA 53 (L) 07/11/2017 0933   GFRAA >60 07/11/2017 0933   Lab Results  Component Value Date   CHOL 125 09/29/2016   HDL 46 09/29/2016   LDLCALC 50 09/29/2016   TRIG  143 09/29/2016   CHOLHDL 2.7 09/29/2016   No results found for: HGBA1C Lab Results  Component Value Date   VITAMINB12 800 01/11/2016   Lab Results  Component Value Date   TSH 1.638 08/02/2012    08/26/09 MRI brain 1. Evolving intraparenchymal hemorrhages within the right frontal, posterior parietal, temporal, and left occipital lobes. 2. No significant stenoses or aneurysm of the anterior or posterior intracranial circulation.  09/08/09 MRI brain ([facility] only) - Multiple tiny foci of blooming throughout the brain are suggestive of amyloid angiopathy. Larger regions of blooming bilaterally in the regions of prior intraparenchymal hemorrhage.  02/14/13 MRI brain  [I reviewed images myself and agree with interpretation. -VRP]  Abnormal MRI brain and IAC (with and without) demonstrating: 1. Chronic right anterior temporal and left occipital hemorrhagic infarctions. 2. Mild periventricular and subcortical chronic small vessel ischemic disease. 3. No abnormal enhancing or compressive lesions. Internal auditory canal (IAC) protocol sequences unremarkable. 4. No acute findings.  05/19/17 MRI scan of the brain [I reviewed images myself and agree with interpretation. -VRP]  - remote age hemorrhagic infarcts with encephalomalacia and gliosis in the right anterior temporal and left right occipital regions. There are multiple remote age chronic microhemorrhages in the supratentorial cortex bilaterally right greater than left with diffuse small vessel disease. There are mild changes of small vessel disease and generalized cerebral atrophy. There are chronic changes of paranasal sinusitis. No acute abnormalities noted. Overall slight progression of microvascular changes in generalized atrophy compared with previous MRI scan dated 02/14/2013.      ASSESSMENT AND PLAN  83 y.o. year old female here with history of multiple intracerebral hemorrhages, possible amyloid angiopathy, now with progressive  short-term memory loss and confusion. Signs and symptoms are suspicious for underlying neurodegenerative disorder. Also with intermittent vertigo and dizziness in the past, which may be related to peripheral vestibulopathy.   Dx: mild dementia (? Alzheimers) + chronic cerebral hemorrhages (? Amyloid angiopathy)  1. Mild dementia   2. Nontraumatic multiple localized intracerebral hemorrhages, unspecified laterality ([place])   3. Memory loss      PLAN:  I spent 25 minutes of face to face time with patient. Greater than 50% of time was spent in counseling and coordination of care with patient. In summary we discussed:   - increase safety and supervision - will provide community resources - consider donepezil or memantine  Return if symptoms worsen or fail to improve, for return to PCP.    Penni Bombard, MD 39/76/7341, 9:37 AM Certified in Neurology, Neurophysiology and Neuroimaging  [facility] [address] [place], [place] [address] [phone]

## 2017-09-22 ENCOUNTER — Other Ambulatory Visit (HOSPITAL_BASED_OUTPATIENT_CLINIC_OR_DEPARTMENT_OTHER): Payer: Medicare HMO

## 2017-09-22 ENCOUNTER — Ambulatory Visit: Payer: Medicare HMO

## 2017-09-22 ENCOUNTER — Ambulatory Visit (HOSPITAL_BASED_OUTPATIENT_CLINIC_OR_DEPARTMENT_OTHER): Payer: Medicare HMO

## 2017-09-22 ENCOUNTER — Ambulatory Visit (HOSPITAL_BASED_OUTPATIENT_CLINIC_OR_DEPARTMENT_OTHER): Payer: Medicare HMO | Admitting: Hematology and Oncology

## 2017-09-22 ENCOUNTER — Telehealth: Payer: Self-pay | Admitting: Hematology and Oncology

## 2017-09-22 ENCOUNTER — Encounter: Payer: Self-pay | Admitting: Hematology and Oncology

## 2017-09-22 VITALS — BP 128/57 | HR 52 | Temp 98.6°F | Resp 16

## 2017-09-22 DIAGNOSIS — Z95828 Presence of other vascular implants and grafts: Secondary | ICD-10-CM

## 2017-09-22 DIAGNOSIS — I421 Obstructive hypertrophic cardiomyopathy: Secondary | ICD-10-CM

## 2017-09-22 DIAGNOSIS — K649 Unspecified hemorrhoids: Secondary | ICD-10-CM

## 2017-09-22 DIAGNOSIS — D693 Immune thrombocytopenic purpura: Secondary | ICD-10-CM | POA: Diagnosis not present

## 2017-09-22 LAB — CBC WITH DIFFERENTIAL/PLATELET
BASO%: 0.6 % (ref 0.0–2.0)
BASOS ABS: 0 10*3/uL (ref 0.0–0.1)
EOS ABS: 0.1 10*3/uL (ref 0.0–0.5)
EOS%: 1.6 % (ref 0.0–7.0)
HEMATOCRIT: 36.1 % (ref 34.8–46.6)
HEMOGLOBIN: 12 g/dL (ref 11.6–15.9)
LYMPH#: 2 10*3/uL (ref 0.9–3.3)
LYMPH%: 43.1 % (ref 14.0–49.7)
MCH: 29.1 pg (ref 25.1–34.0)
MCHC: 33.2 g/dL (ref 31.5–36.0)
MCV: 87.6 fL (ref 79.5–101.0)
MONO#: 0.5 10*3/uL (ref 0.1–0.9)
MONO%: 10.9 % (ref 0.0–14.0)
NEUT#: 2.1 10*3/uL (ref 1.5–6.5)
NEUT%: 43.8 % (ref 38.4–76.8)
Platelets: 108 10*3/uL — ABNORMAL LOW (ref 145–400)
RBC: 4.12 10*6/uL (ref 3.70–5.45)
RDW: 13.7 % (ref 11.2–14.5)
WBC: 4.7 10*3/uL (ref 3.9–10.3)

## 2017-09-22 MED ORDER — DIPHENHYDRAMINE HCL 25 MG PO TABS
25.0000 mg | ORAL_TABLET | Freq: Once | ORAL | Status: AC
  Administered 2017-09-22: 25 mg via ORAL
  Filled 2017-09-22: qty 1

## 2017-09-22 MED ORDER — SODIUM CHLORIDE 0.9% FLUSH
10.0000 mL | INTRAVENOUS | Status: AC | PRN
  Administered 2017-09-22 (×2): 10 mL via INTRAVENOUS
  Filled 2017-09-22: qty 10

## 2017-09-22 MED ORDER — ACETAMINOPHEN 325 MG PO TABS
ORAL_TABLET | ORAL | Status: AC
  Filled 2017-09-22: qty 2

## 2017-09-22 MED ORDER — ACETAMINOPHEN 325 MG PO TABS
650.0000 mg | ORAL_TABLET | Freq: Once | ORAL | Status: AC
  Administered 2017-09-22: 650 mg via ORAL

## 2017-09-22 MED ORDER — DIPHENHYDRAMINE HCL 25 MG PO CAPS
ORAL_CAPSULE | ORAL | Status: AC
  Filled 2017-09-22: qty 1

## 2017-09-22 MED ORDER — FAMOTIDINE IN NACL 20-0.9 MG/50ML-% IV SOLN
INTRAVENOUS | Status: AC
  Filled 2017-09-22: qty 50

## 2017-09-22 MED ORDER — FAMOTIDINE 20 MG PO TABS
ORAL_TABLET | ORAL | Status: AC
  Filled 2017-09-22: qty 1

## 2017-09-22 MED ORDER — IMMUNE GLOBULIN (HUMAN) 20 GM/200ML IV SOLN
1.0000 g/kg | Freq: Once | INTRAVENOUS | Status: AC
  Administered 2017-09-22: 60 g via INTRAVENOUS
  Filled 2017-09-22: qty 600

## 2017-09-22 MED ORDER — FAMOTIDINE 20 MG PO TABS
20.0000 mg | ORAL_TABLET | Freq: Once | ORAL | Status: AC
  Administered 2017-09-22: 20 mg via ORAL

## 2017-09-22 NOTE — Assessment & Plan Note (Signed)
She has intermittent hemorrhoidal bleeding once a month; it is minor I recommend conservative management Platelet count is stable well over 100,000 and I do not think this contributed to her bleeding

## 2017-09-22 NOTE — Telephone Encounter (Signed)
Patient will receive updated schedule in treatment area. Patient scheduled per 1/4 los.  °

## 2017-09-22 NOTE — Patient Instructions (Signed)
Implanted Port Home Guide An implanted port is a type of central line that is placed under the skin. Central lines are used to provide IV access when treatment or nutrition needs to be given through a person's veins. Implanted ports are used for long-term IV access. An implanted port may be placed because:  You need IV medicine that would be irritating to the small veins in your hands or arms.  You need long-term IV medicines, such as antibiotics.  You need IV nutrition for a long period.  You need frequent blood draws for lab tests.  You need dialysis.  Implanted ports are usually placed in the chest area, but they can also be placed in the upper arm, the abdomen, or the leg. An implanted port has two main parts:  Reservoir. The reservoir is round and will appear as a small, raised area under your skin. The reservoir is the part where a needle is inserted to give medicines or draw blood.  Catheter. The catheter is a thin, flexible tube that extends from the reservoir. The catheter is placed into a large vein. Medicine that is inserted into the reservoir goes into the catheter and then into the vein.  How will I care for my incision site? Do not get the incision site wet. Bathe or shower as directed by your health care provider. How is my port accessed? Special steps must be taken to access the port:  Before the port is accessed, a numbing cream can be placed on the skin. This helps numb the skin over the port site.  Your health care provider uses a sterile technique to access the port. ? Your health care provider must put on a mask and sterile gloves. ? The skin over your port is cleaned carefully with an antiseptic and allowed to dry. ? The port is gently pinched between sterile gloves, and a needle is inserted into the port.  Only "non-coring" port needles should be used to access the port. Once the port is accessed, a blood return should be checked. This helps ensure that the port  is in the vein and is not clogged.  If your port needs to remain accessed for a constant infusion, a clear (transparent) bandage will be placed over the needle site. The bandage and needle will need to be changed every week, or as directed by your health care provider.  Keep the bandage covering the needle clean and dry. Do not get it wet. Follow your health care provider's instructions on how to take a shower or bath while the port is accessed.  If your port does not need to stay accessed, no bandage is needed over the port.  What is flushing? Flushing helps keep the port from getting clogged. Follow your health care provider's instructions on how and when to flush the port. Ports are usually flushed with saline solution or a medicine called heparin. The need for flushing will depend on how the port is used.  If the port is used for intermittent medicines or blood draws, the port will need to be flushed: ? After medicines have been given. ? After blood has been drawn. ? As part of routine maintenance.  If a constant infusion is running, the port may not need to be flushed.  How long will my port stay implanted? The port can stay in for as long as your health care provider thinks it is needed. When it is time for the port to come out, surgery will be   done to remove it. The procedure is similar to the one performed when the port was put in. When should I seek immediate medical care? When you have an implanted port, you should seek immediate medical care if:  You notice a bad smell coming from the incision site.  You have swelling, redness, or drainage at the incision site.  You have more swelling or pain at the port site or the surrounding area.  You have a fever that is not controlled with medicine.  This information is not intended to replace advice given to you by your health care provider. Make sure you discuss any questions you have with your health care provider. Document  Released: 09/05/2005 Document Revised: 02/11/2016 Document Reviewed: 05/13/2013 Elsevier Interactive Patient Education  2017 [employer]  

## 2017-09-22 NOTE — Progress Notes (Signed)
[name]  Adrienne Ada, MD SUMMARY OF HEMATOLOGIC HISTORY:  This is a pleasant lady with chronic thrombocytopenia. The patient was discovered to have low platelet count when she was placed on Plavix for coronary artery disease. 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 She is being observed. On 01/04/2016, she was started on prednisone therapy for ITP when her platelet count dip under  50,000 May 2017, she tolerated prednisone poorly and prednisone is slowly tapered off 05/05/2017, she had relapsed ITP and started on IVIG with complete response to Rx but with platelet count trending down after 4 weeks. She is then scheduled for IVIG monthly INTERVAL HISTORY: [name] 84 y.o. female returns for further follow-up. She has intermittent hemorrhoidal bleeding once a month, my arms of bleeding such as epistaxis or hematuria Denies recent infusion reaction No recent exacerbation of heart issues  I have reviewed the past medical history, past surgical history, social history and family history with the patient and they are unchanged from previous [name].  ALLERGIES:  is allergic to bee venom; iodides; lyrica [pregabalin]; codeine; demerol [meperidine]; ambien [zolpidem tartrate]; aspirin; celebrex [celecoxib]; darvocet [propoxyphene n-acetaminophen]; fosamax [alendronate sodium]; lipitor [atorvastatin]; morphine and related; valium [diazepam]; and iodine.  MEDICATIONS:  Current Outpatient Medications  Medication Sig Dispense Refill  . amLODipine (NORVASC) 5 MG tablet Take 1 tablet (5 mg total) by mouth daily. 90 tablet 3  . calcium citrate (CALCITRATE - DOSED IN MG ELEMENTAL CALCIUM) 950 MG tablet Take 1 tablet by mouth daily.    . cetirizine (ZYRTEC) 10 MG tablet Take 10 mg by  mouth daily.    . Cholecalciferol (VITAMIN D-3 PO) Take 1 tablet by mouth daily.    Marland Kitchen EPINEPHrine (ADRENALIN) 1 MG/ML injection Inject 1 mg as directed as needed for anaphylaxis. Reported on 02/01/2016    . ezetimibe (ZETIA) 10 MG tablet Take 1 tablet (10 mg total) by mouth daily. 30 tablet 6  . lidocaine-prilocaine (EMLA) cream Apply 1 application topically as needed. 30 g 6  . Lysine 500 MG CAPS Take 1 capsule by mouth 2 (two) times daily as needed (fever blisters).     . meclizine (ANTIVERT) 25 MG tablet Take 25 mg by mouth daily as needed for dizziness.     . Multiple Vitamins-Minerals (CENTRUM SILVER 50+WOMEN PO) Take 1 tablet by mouth daily.    . nitroGLYCERIN (NITROSTAT) 0.4 MG SL tablet Place 0.4 mg under the tongue every 5 (five) minutes as needed for chest pain. Reported on 02/01/2016    . Omega 3 1000 MG CAPS Take 1 each by mouth daily.     Marland Kitchen omeprazole (PRILOSEC) 40 MG capsule Take 40 mg by mouth daily.    . Prenatal Vit-Fe Fumarate-FA (PRENATAL VITAMIN PO) Take 1 tablet by mouth 2 (two) times daily.    . psyllium (METAMUCIL) 58.6 % powder Take 1 packet by mouth daily.    . rosuvastatin (CRESTOR) 10 MG tablet Take 10 mg by mouth daily.  3  . sotalol (BETAPACE) 160 MG tablet TAKE 1 TABLET (160 MG TOTAL) BY MOUTH 2 (TWO) TIMES DAILY. 60 tablet 10   No current facility-administered medications for this visit.    Facility-Administered Medications Ordered in Other Visits  Medication Dose Route Frequency Provider Last Rate Last Dose  . acetaminophen (TYLENOL) tablet 650 mg  650  mg Oral Once Heath Lark, MD      . diphenhydrAMINE (BENADRYL) tablet 25 mg  25 mg Oral Once Alvy Bimler, Bassy Fetterly, MD      . famotidine (PEPCID) tablet 20 mg  20 mg Oral Once Alvy Bimler, Peightyn Roberson, MD      . Immune Globulin 10% (PRIVIGEN) IV infusion 60 g  1 g/kg Intravenous Once Alvy Bimler, Kayann Maj, MD      . sodium chloride flush (NS) 0.9 % injection 10 mL  10 mL Intravenous PRN Alvy Bimler, Zayleigh Stroh, MD   10 mL at 09/22/17 0810     REVIEW OF  SYSTEMS:   Constitutional: Denies fevers, chills or night sweats Eyes: Denies blurriness of vision Ears, nose, mouth, throat, and face: Denies mucositis or sore throat Respiratory: Denies cough, dyspnea or wheezes Cardiovascular: Denies palpitation, chest discomfort or lower extremity swelling Gastrointestinal:  Denies nausea, heartburn or change in bowel habits Skin: Denies abnormal skin rashes Lymphatics: Denies new lymphadenopathy or easy bruising Neurological:Denies numbness, tingling or new weaknesses Behavioral/Psych: Mood is stable, no new changes  All other systems were reviewed with the patient and are negative.  PHYSICAL EXAMINATION: ECOG PERFORMANCE STATUS: 0 - Asymptomatic  Vitals:   09/22/17 0837  BP: (!) 128/58  Pulse: (!) 57  Resp: 18  Temp: (!) 97.5 F (36.4 C)  SpO2: 100%   Filed Weights   09/22/17 0837  Weight: 137 lb (62.1 kg)    GENERAL:alert, no distress and comfortable SKIN: skin color, texture, turgor are normal, no rashes or significant lesions EYES: normal, Conjunctiva are pink and non-injected, sclera clear OROPHARYNX:no exudate, no erythema and lips, buccal mucosa, and tongue normal  NECK: supple, thyroid normal size, non-tender, without nodularity LYMPH:  no palpable lymphadenopathy in the cervical, axillary or inguinal LUNGS: clear to auscultation and percussion with normal breathing effort HEART: regular rate & rhythm and no murmurs and no lower extremity edema ABDOMEN:abdomen soft, non-tender and normal bowel sounds Musculoskeletal:no cyanosis of digits and no clubbing  NEURO: alert & oriented x 3 with fluent speech, no focal motor/sensory deficits  LABORATORY DATA:  I have reviewed the data as listed     Component Value Date/Time   NA 137 07/11/2017 0933   NA 142 05/05/2017 0850   K 4.3 07/11/2017 0933   K 4.0 05/05/2017 0850   CL 104 07/11/2017 0933   CL 104 08/02/2012 1515   CO2 24 07/11/2017 0933   CO2 25 05/05/2017 0850    GLUCOSE 110 (H) 07/11/2017 0933   GLUCOSE 130 05/05/2017 0850   GLUCOSE 105 (H) 08/02/2012 1515   BUN 17 07/11/2017 0933   BUN 18.9 05/05/2017 0850   CREATININE 0.97 07/11/2017 0933   CREATININE 0.9 05/05/2017 0850   CALCIUM 9.7 07/11/2017 0933   CALCIUM 10.0 05/05/2017 0850   PROT 6.9 05/05/2017 0850   ALBUMIN 3.8 05/05/2017 0850   AST 19 05/05/2017 0850   ALT 14 05/05/2017 0850   ALKPHOS 54 05/05/2017 0850   BILITOT 0.48 05/05/2017 0850   GFRNONAA 53 (L) 07/11/2017 0933   GFRAA >60 07/11/2017 0933    No results found for: SPEP, UPEP  Lab Results  Component Value Date   WBC 4.7 09/22/2017   NEUTROABS 2.1 09/22/2017   HGB 12.0 09/22/2017   HCT 36.1 09/22/2017   MCV 87.6 09/22/2017   PLT 108 (L) 09/22/2017      Chemistry      Component Value Date/Time   NA 137 07/11/2017 0933   NA 142 05/05/2017 0850   K  4.3 07/11/2017 0933   K 4.0 05/05/2017 0850   CL 104 07/11/2017 0933   CL 104 08/02/2012 1515   CO2 24 07/11/2017 0933   CO2 25 05/05/2017 0850   BUN 17 07/11/2017 0933   BUN 18.9 05/05/2017 0850   CREATININE 0.97 07/11/2017 0933   CREATININE 0.9 05/05/2017 0850      Component Value Date/Time   CALCIUM 9.7 07/11/2017 0933   CALCIUM 10.0 05/05/2017 0850   ALKPHOS 54 05/05/2017 0850   AST 19 05/05/2017 0850   ALT 14 05/05/2017 0850   BILITOT 0.48 05/05/2017 0850       ASSESSMENT & PLAN:  Idiopathic thrombocytopenic purpura (HCC) The patient has recurrent ITP, with poor tolerance to steroid treatment, subsequently had near complete response to IVIG Given her age and prior history of stroke, I also discussed possibility of getting IVIG at home through partnership with advance home care but the patient declined For now, the plan would be monthly IVIG I plan to see her back again next month with her next infusion  Hypertrophic obstructive cardiomyopathy ([place]) Examination today is satisfactory without any signs of congestive heart failure I would defer to  her cardiologist for further management  Bleeding hemorrhoid She has intermittent hemorrhoidal bleeding once a month; it is minor I recommend conservative management Platelet count is stable well over 100,000 and I do not think this contributed to her bleeding   No orders of the defined types were placed in this encounter.   All questions were answered. The patient knows to call the clinic with any problems, questions or concerns. No barriers to learning was detected.  I spent 10 minutes counseling the patient face to face. The total time spent in the appointment was 15 minutes and more than 50% was on counseling.     Heath Lark, MD 1/4/20198:58 AM

## 2017-09-22 NOTE — Assessment & Plan Note (Signed)
The patient has recurrent ITP, with poor tolerance to steroid treatment, subsequently had near complete response to IVIG Given her age and prior history of stroke, I also discussed possibility of getting IVIG at home through partnership with advance home care but the patient declined For now, the plan would be monthly IVIG I plan to see her back again next month with her next infusion

## 2017-09-22 NOTE — Patient Instructions (Signed)

## 2017-09-22 NOTE — Assessment & Plan Note (Signed)
Examination today is satisfactory without any signs of congestive heart failure I would defer to her cardiologist for further management 

## 2017-10-20 ENCOUNTER — Encounter: Payer: Self-pay | Admitting: Hematology and Oncology

## 2017-10-20 ENCOUNTER — Inpatient Hospital Stay: Payer: Medicare HMO | Attending: Hematology and Oncology

## 2017-10-20 ENCOUNTER — Inpatient Hospital Stay (HOSPITAL_BASED_OUTPATIENT_CLINIC_OR_DEPARTMENT_OTHER): Payer: Medicare HMO | Admitting: Hematology and Oncology

## 2017-10-20 ENCOUNTER — Inpatient Hospital Stay: Payer: Medicare HMO

## 2017-10-20 ENCOUNTER — Telehealth: Payer: Self-pay | Admitting: Hematology and Oncology

## 2017-10-20 VITALS — BP 147/70 | HR 54 | Temp 97.8°F | Resp 18

## 2017-10-20 DIAGNOSIS — Z8673 Personal history of transient ischemic attack (TIA), and cerebral infarction without residual deficits: Secondary | ICD-10-CM | POA: Diagnosis not present

## 2017-10-20 DIAGNOSIS — D693 Immune thrombocytopenic purpura: Secondary | ICD-10-CM

## 2017-10-20 DIAGNOSIS — I421 Obstructive hypertrophic cardiomyopathy: Secondary | ICD-10-CM | POA: Insufficient documentation

## 2017-10-20 DIAGNOSIS — Z95828 Presence of other vascular implants and grafts: Secondary | ICD-10-CM

## 2017-10-20 DIAGNOSIS — Z79899 Other long term (current) drug therapy: Secondary | ICD-10-CM

## 2017-10-20 LAB — CBC WITH DIFFERENTIAL/PLATELET
Basophils Absolute: 0 10*3/uL (ref 0.0–0.1)
Basophils Relative: 1 %
EOS ABS: 0.1 10*3/uL (ref 0.0–0.5)
Eosinophils Relative: 2 %
HCT: 35.1 % (ref 34.8–46.6)
HEMOGLOBIN: 11.9 g/dL (ref 11.6–15.9)
LYMPHS ABS: 2.2 10*3/uL (ref 0.9–3.3)
Lymphocytes Relative: 42 %
MCH: 29.6 pg (ref 25.1–34.0)
MCHC: 33.9 g/dL (ref 31.5–36.0)
MCV: 87.2 fL (ref 79.5–101.0)
MONOS PCT: 12 %
Monocytes Absolute: 0.6 10*3/uL (ref 0.1–0.9)
NEUTROS PCT: 45 %
Neutro Abs: 2.4 10*3/uL (ref 1.5–6.5)
Platelets: 108 10*3/uL — ABNORMAL LOW (ref 145–400)
RBC: 4.03 MIL/uL (ref 3.70–5.45)
RDW: 13.7 % (ref 11.2–14.5)
WBC: 5.2 10*3/uL (ref 3.9–10.3)

## 2017-10-20 MED ORDER — FAMOTIDINE 20 MG PO TABS
20.0000 mg | ORAL_TABLET | Freq: Once | ORAL | Status: AC
  Administered 2017-10-20: 20 mg via ORAL

## 2017-10-20 MED ORDER — DIPHENHYDRAMINE HCL 25 MG PO TABS
25.0000 mg | ORAL_TABLET | Freq: Once | ORAL | Status: AC
  Administered 2017-10-20: 25 mg via ORAL
  Filled 2017-10-20: qty 1

## 2017-10-20 MED ORDER — ACETAMINOPHEN 325 MG PO TABS
ORAL_TABLET | ORAL | Status: AC
Start: 2017-10-20 — End: ?
  Filled 2017-10-20: qty 2

## 2017-10-20 MED ORDER — DIPHENHYDRAMINE HCL 25 MG PO CAPS
ORAL_CAPSULE | ORAL | Status: AC
  Filled 2017-10-20: qty 1

## 2017-10-20 MED ORDER — HEPARIN SOD (PORK) LOCK FLUSH 100 UNIT/ML IV SOLN
500.0000 [IU] | Freq: Once | INTRAVENOUS | Status: AC
  Administered 2017-10-20: 500 [IU]
  Filled 2017-10-20: qty 5

## 2017-10-20 MED ORDER — SODIUM CHLORIDE 0.9% FLUSH
10.0000 mL | Freq: Once | INTRAVENOUS | Status: AC
  Administered 2017-10-20: 10 mL
  Filled 2017-10-20: qty 10

## 2017-10-20 MED ORDER — SODIUM CHLORIDE 0.9 % IV SOLN
Freq: Once | INTRAVENOUS | Status: AC
  Administered 2017-10-20: 10:00:00 via INTRAVENOUS

## 2017-10-20 MED ORDER — FAMOTIDINE 20 MG PO TABS
ORAL_TABLET | ORAL | Status: AC
  Filled 2017-10-20: qty 1

## 2017-10-20 MED ORDER — ACETAMINOPHEN 325 MG PO TABS
650.0000 mg | ORAL_TABLET | Freq: Once | ORAL | Status: AC
  Administered 2017-10-20: 650 mg via ORAL

## 2017-10-20 MED ORDER — IMMUNE GLOBULIN (HUMAN) 20 GM/200ML IV SOLN
1.0000 g/kg | Freq: Once | INTRAVENOUS | Status: AC
  Administered 2017-10-20: 60 g via INTRAVENOUS
  Filled 2017-10-20: qty 600

## 2017-10-20 NOTE — Patient Instructions (Signed)

## 2017-10-20 NOTE — Telephone Encounter (Signed)
Scheduled appt per 2/1 los - Gave patient AVS and calender per los.  

## 2017-10-20 NOTE — Assessment & Plan Note (Signed)
The patient has recurrent ITP, with poor tolerance to steroid treatment, subsequently had near complete response to IVIG Given her age and prior history of stroke, I also discussed possibility of getting IVIG at home through partnership with advance home care but the patient declined For now, the plan would be monthly IVIG I plan to see her back again next month with her next infusion

## 2017-10-20 NOTE — Progress Notes (Signed)
[name] OFFICE PROGRESS NOTE  Adrienne Ada, MD SUMMARY OF HEMATOLOGIC HISTORY:  This is a pleasant lady with chronic thrombocytopenia. The patient was discovered to have low platelet count when she was placed on Plavix for coronary artery disease. 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 She is being observed. On 01/04/2016, she was started on prednisone therapy for ITP when her platelet count dip under  50,000 May 2017, she tolerated prednisone poorly and prednisone is slowly tapered off 05/05/2017, she had relapsed ITP and started on IVIG with complete response to Rx but with platelet count trending down after 4 weeks. She is then scheduled for IVIG monthly INTERVAL HISTORY: [name] 84 y.o. female returns for further follow-up. She denies recent infusion reaction No recent infection The patient denies any recent signs or symptoms of bleeding such as spontaneous epistaxis, hematuria or hematochezia.  I have reviewed the past medical history, past surgical history, social history and family history with the patient and they are unchanged from previous note.  ALLERGIES:  is allergic to bee venom; iodides; lyrica [pregabalin]; codeine; demerol [meperidine]; ambien [zolpidem tartrate]; aspirin; celebrex [celecoxib]; darvocet [propoxyphene n-acetaminophen]; fosamax [alendronate sodium]; lipitor [atorvastatin]; morphine and related; valium [diazepam]; and iodine.  MEDICATIONS:  Current Outpatient Medications  Medication Sig Dispense Refill  . amLODipine (NORVASC) 5 MG tablet Take 1 tablet (5 mg total) by mouth daily. 90 tablet 3  . calcium citrate (CALCITRATE - DOSED IN MG ELEMENTAL CALCIUM) 950 MG tablet Take 1 tablet by mouth daily.    . cetirizine (ZYRTEC) 10 MG tablet Take 10 mg by mouth  daily.    . Cholecalciferol (VITAMIN D-3 PO) Take 1 tablet by mouth daily.    Marland Kitchen EPINEPHrine (ADRENALIN) 1 MG/ML injection Inject 1 mg as directed as needed for anaphylaxis. Reported on 02/01/2016    . ezetimibe (ZETIA) 10 MG tablet Take 1 tablet (10 mg total) by mouth daily. 30 tablet 6  . lidocaine-prilocaine (EMLA) cream Apply 1 application topically as needed. 30 g 6  . Lysine 500 MG CAPS Take 1 capsule by mouth 2 (two) times daily as needed (fever blisters).     . meclizine (ANTIVERT) 25 MG tablet Take 25 mg by mouth daily as needed for dizziness.     . Multiple Vitamins-Minerals (CENTRUM SILVER 50+WOMEN PO) Take 1 tablet by mouth daily.    . nitroGLYCERIN (NITROSTAT) 0.4 MG SL tablet Place 0.4 mg under the tongue every 5 (five) minutes as needed for chest pain. Reported on 02/01/2016    . Omega 3 1000 MG CAPS Take 1 each by mouth daily.     Marland Kitchen omeprazole (PRILOSEC) 40 MG capsule Take 40 mg by mouth daily.    . Prenatal Vit-Fe Fumarate-FA (PRENATAL VITAMIN PO) Take 1 tablet by mouth 2 (two) times daily.    . psyllium (METAMUCIL) 58.6 % powder Take 1 packet by mouth daily.    . rosuvastatin (CRESTOR) 10 MG tablet Take 10 mg by mouth daily.  3  . sotalol (BETAPACE) 160 MG tablet TAKE 1 TABLET (160 MG TOTAL) BY MOUTH 2 (TWO) TIMES DAILY. 60 tablet 10   No current facility-administered medications for this visit.    Facility-Administered Medications Ordered in Other Visits  Medication Dose Route Frequency Provider Last Rate Last Dose  . heparin lock flush 100 unit/mL  500 Units Intracatheter  Once Heath Lark, MD      . Immune Globulin 10% (PRIVIGEN) IV infusion 60 g  1 g/kg Intravenous Once Calistro Rauf, MD      . sodium chloride flush (NS) 0.9 % injection 10 mL  10 mL Intravenous PRN Alvy Bimler, Casson Catena, MD   10 mL at 09/22/17 1322  . sodium chloride flush (NS) 0.9 % injection 10 mL  10 mL Intracatheter Once Heath Lark, MD         REVIEW OF SYSTEMS:   Constitutional: Denies fevers, chills or night  sweats Eyes: Denies blurriness of vision Ears, nose, mouth, throat, and face: Denies mucositis or sore throat Respiratory: Denies cough, dyspnea or wheezes Cardiovascular: Denies palpitation, chest discomfort or lower extremity swelling Gastrointestinal:  Denies nausea, heartburn or change in bowel habits Skin: Denies abnormal skin rashes Lymphatics: Denies new lymphadenopathy  Neurological:Denies numbness, tingling or new weaknesses Behavioral/Psych: Mood is stable, no new changes  All other systems were reviewed with the patient and are negative.  PHYSICAL EXAMINATION: ECOG PERFORMANCE STATUS: 0 - Asymptomatic  Vitals:   10/20/17 0910  BP: (!) 140/55  Pulse: (!) 52  Resp: 18  Temp: (!) 97.5 F (36.4 C)  SpO2: 100%   Filed Weights   10/20/17 0910  Weight: 135 lb 3.2 oz (61.3 kg)    GENERAL:alert, no distress and comfortable SKIN: skin color, texture, turgor are normal, no rashes or significant lesions EYES: normal, Conjunctiva are pink and non-injected, sclera clear OROPHARYNX:no exudate, no erythema and lips, buccal mucosa, and tongue normal  NECK: supple, thyroid normal size, non-tender, without nodularity LYMPH:  no palpable lymphadenopathy in the cervical, axillary or inguinal LUNGS: clear to auscultation and percussion with normal breathing effort HEART: regular rate & rhythm with soft left systolic murmurs and no lower extremity edema ABDOMEN:abdomen soft, non-tender and normal bowel sounds Musculoskeletal:no cyanosis of digits and no clubbing  NEURO: alert & oriented x 3 with fluent speech, no focal motor/sensory deficits  LABORATORY DATA:  I have reviewed the data as listed     Component Value Date/Time   NA 137 07/11/2017 0933   NA 142 05/05/2017 0850   K 4.3 07/11/2017 0933   K 4.0 05/05/2017 0850   CL 104 07/11/2017 0933   CL 104 08/02/2012 1515   CO2 24 07/11/2017 0933   CO2 25 05/05/2017 0850   GLUCOSE 110 (H) 07/11/2017 0933   GLUCOSE 130  05/05/2017 0850   GLUCOSE 105 (H) 08/02/2012 1515   BUN 17 07/11/2017 0933   BUN 18.9 05/05/2017 0850   CREATININE 0.97 07/11/2017 0933   CREATININE 0.9 05/05/2017 0850   CALCIUM 9.7 07/11/2017 0933   CALCIUM 10.0 05/05/2017 0850   PROT 6.9 05/05/2017 0850   ALBUMIN 3.8 05/05/2017 0850   AST 19 05/05/2017 0850   ALT 14 05/05/2017 0850   ALKPHOS 54 05/05/2017 0850   BILITOT 0.48 05/05/2017 0850   GFRNONAA 53 (L) 07/11/2017 0933   GFRAA >60 07/11/2017 0933    No results found for: SPEP, UPEP  Lab Results  Component Value Date   WBC 5.2 10/20/2017   NEUTROABS 2.4 10/20/2017   HGB 11.9 10/20/2017   HCT 35.1 10/20/2017   MCV 87.2 10/20/2017   PLT 108 (L) 10/20/2017      Chemistry      Component Value Date/Time   NA 137 07/11/2017 0933   NA 142 05/05/2017 0850   K 4.3 07/11/2017 0933   K 4.0 05/05/2017 0850   CL 104 07/11/2017 0933  CL 104 08/02/2012 1515   CO2 24 07/11/2017 0933   CO2 25 05/05/2017 0850   BUN 17 07/11/2017 0933   BUN 18.9 05/05/2017 0850   CREATININE 0.97 07/11/2017 0933   CREATININE 0.9 05/05/2017 0850      Component Value Date/Time   CALCIUM 9.7 07/11/2017 0933   CALCIUM 10.0 05/05/2017 0850   ALKPHOS 54 05/05/2017 0850   AST 19 05/05/2017 0850   ALT 14 05/05/2017 0850   BILITOT 0.48 05/05/2017 0850      ASSESSMENT & PLAN:  Idiopathic thrombocytopenic purpura (HCC) The patient has recurrent ITP, with poor tolerance to steroid treatment, subsequently had near complete response to IVIG Given her age and prior history of stroke, I also discussed possibility of getting IVIG at home through partnership with advance home care but the patient declined For now, the plan would be monthly IVIG I plan to see her back again next month with her next infusion  Hypertrophic obstructive cardiomyopathy ([place]) Examination today is satisfactory without any signs of congestive heart failure I would defer to her cardiologist for further management   No  orders of the defined types were placed in this encounter.   All questions were answered. The patient knows to call the clinic with any problems, questions or concerns. No barriers to learning was detected.  I spent 10 minutes counseling the patient face to face. The total time spent in the appointment was 15 minutes and more than 50% was on counseling.     Heath Lark, MD 2/1/201910:24 AM

## 2017-10-20 NOTE — Progress Notes (Signed)
Ok to proceed with treatment with heart rate 43 per Dr. Alvy Bimler.

## 2017-10-20 NOTE — Assessment & Plan Note (Signed)
Examination today is satisfactory without any signs of congestive heart failure I would defer to her cardiologist for further management 

## 2017-11-17 ENCOUNTER — Inpatient Hospital Stay (HOSPITAL_BASED_OUTPATIENT_CLINIC_OR_DEPARTMENT_OTHER): Payer: Medicare HMO | Admitting: Hematology and Oncology

## 2017-11-17 ENCOUNTER — Inpatient Hospital Stay: Payer: Medicare HMO | Attending: Hematology and Oncology

## 2017-11-17 ENCOUNTER — Inpatient Hospital Stay: Payer: Medicare HMO

## 2017-11-17 ENCOUNTER — Encounter: Payer: Self-pay | Admitting: Hematology and Oncology

## 2017-11-17 ENCOUNTER — Telehealth: Payer: Self-pay | Admitting: Hematology and Oncology

## 2017-11-17 VITALS — BP 118/52 | HR 45 | Temp 98.2°F | Resp 18

## 2017-11-17 DIAGNOSIS — Z79899 Other long term (current) drug therapy: Secondary | ICD-10-CM

## 2017-11-17 DIAGNOSIS — Z8673 Personal history of transient ischemic attack (TIA), and cerebral infarction without residual deficits: Secondary | ICD-10-CM | POA: Diagnosis not present

## 2017-11-17 DIAGNOSIS — Z95828 Presence of other vascular implants and grafts: Secondary | ICD-10-CM

## 2017-11-17 DIAGNOSIS — D693 Immune thrombocytopenic purpura: Secondary | ICD-10-CM

## 2017-11-17 DIAGNOSIS — I421 Obstructive hypertrophic cardiomyopathy: Secondary | ICD-10-CM | POA: Insufficient documentation

## 2017-11-17 DIAGNOSIS — R413 Other amnesia: Secondary | ICD-10-CM | POA: Insufficient documentation

## 2017-11-17 LAB — CBC WITH DIFFERENTIAL/PLATELET
BASOS PCT: 0 %
Basophils Absolute: 0 10*3/uL (ref 0.0–0.1)
EOS ABS: 0.1 10*3/uL (ref 0.0–0.5)
Eosinophils Relative: 1 %
HEMATOCRIT: 35.1 % (ref 34.8–46.6)
HEMOGLOBIN: 11.5 g/dL — AB (ref 11.6–15.9)
LYMPHS ABS: 2.3 10*3/uL (ref 0.9–3.3)
Lymphocytes Relative: 47 %
MCH: 28.9 pg (ref 25.1–34.0)
MCHC: 32.8 g/dL (ref 31.5–36.0)
MCV: 88.2 fL (ref 79.5–101.0)
Monocytes Absolute: 0.5 10*3/uL (ref 0.1–0.9)
Monocytes Relative: 10 %
NEUTROS ABS: 2.1 10*3/uL (ref 1.5–6.5)
NEUTROS PCT: 42 %
Platelets: 100 10*3/uL — ABNORMAL LOW (ref 145–400)
RBC: 3.98 MIL/uL (ref 3.70–5.45)
RDW: 14 % (ref 11.2–14.5)
WBC: 5 10*3/uL (ref 3.9–10.3)

## 2017-11-17 MED ORDER — ACETAMINOPHEN 325 MG PO TABS
ORAL_TABLET | ORAL | Status: AC
  Filled 2017-11-17: qty 2

## 2017-11-17 MED ORDER — IMMUNE GLOBULIN (HUMAN) 5 GM/50ML IV SOLN
55.0000 g | Freq: Once | INTRAVENOUS | Status: AC
  Administered 2017-11-17: 55 g via INTRAVENOUS
  Filled 2017-11-17: qty 50

## 2017-11-17 MED ORDER — ACETAMINOPHEN 325 MG PO TABS
650.0000 mg | ORAL_TABLET | Freq: Once | ORAL | Status: AC
  Administered 2017-11-17: 650 mg via ORAL

## 2017-11-17 MED ORDER — FAMOTIDINE 20 MG PO TABS
ORAL_TABLET | ORAL | Status: AC
  Filled 2017-11-17: qty 1

## 2017-11-17 MED ORDER — ACETAMINOPHEN 325 MG PO TABS
ORAL_TABLET | ORAL | Status: AC
  Filled 2017-11-17: qty 1

## 2017-11-17 MED ORDER — FAMOTIDINE 20 MG PO TABS
20.0000 mg | ORAL_TABLET | Freq: Once | ORAL | Status: AC
  Administered 2017-11-17: 20 mg via ORAL

## 2017-11-17 MED ORDER — HEPARIN SOD (PORK) LOCK FLUSH 100 UNIT/ML IV SOLN
500.0000 [IU] | Freq: Once | INTRAVENOUS | Status: AC
  Administered 2017-11-17: 500 [IU]
  Filled 2017-11-17: qty 5

## 2017-11-17 MED ORDER — SODIUM CHLORIDE 0.9% FLUSH
10.0000 mL | Freq: Once | INTRAVENOUS | Status: AC
  Administered 2017-11-17: 10 mL
  Filled 2017-11-17: qty 10

## 2017-11-17 MED ORDER — DIPHENHYDRAMINE HCL 25 MG PO CAPS
ORAL_CAPSULE | ORAL | Status: AC
  Filled 2017-11-17: qty 1

## 2017-11-17 MED ORDER — DIPHENHYDRAMINE HCL 25 MG PO TABS
25.0000 mg | ORAL_TABLET | Freq: Once | ORAL | Status: AC
  Administered 2017-11-17: 25 mg via ORAL
  Filled 2017-11-17: qty 1

## 2017-11-17 NOTE — Assessment & Plan Note (Signed)
Examination today is satisfactory without any signs of congestive heart failure I would defer to her cardiologist for further management 

## 2017-11-17 NOTE — Progress Notes (Signed)
[name] OFFICE PROGRESS NOTE  [name], MD SUMMARY OF HEMATOLOGIC HISTORY:  This is a pleasant lady with chronic thrombocytopenia. The patient was discovered to have low platelet count when she was placed on Plavix for coronary artery disease. 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 She is being observed. On 01/04/2016, she was started on prednisone therapy for ITP when her platelet count dip under  50,000 May 2017, she tolerated prednisone poorly and prednisone is slowly tapered off 05/05/2017, she had relapsed ITP and started on IVIG with complete response to Rx but with platelet count trending down after 4 weeks. She is then scheduled for IVIG monthly INTERVAL HISTORY: [name] 84 y.o. female returns for further follow-up. She tolerated infusion well without any side effects. She does not remember any events since the last time I saw her Her husband spoke for her.  There were no reported recent infection or bleeding.  I have reviewed the past medical history, past surgical history, social history and family history with the patient and they are unchanged from previous note.  ALLERGIES:  is allergic to bee venom; iodides; lyrica [pregabalin]; codeine; demerol [meperidine]; ambien [zolpidem tartrate]; aspirin; celebrex [celecoxib]; darvocet [propoxyphene n-acetaminophen]; fosamax [alendronate sodium]; lipitor [atorvastatin]; morphine and related; valium [diazepam]; and iodine.  MEDICATIONS:  Current Outpatient Medications  Medication Sig Dispense Refill  . amLODipine (NORVASC) 5 MG tablet Take 1 tablet (5 mg total) by mouth daily. 90 tablet 3  . calcium citrate (CALCITRATE - DOSED IN MG ELEMENTAL CALCIUM) 950 MG tablet Take 1 tablet by mouth daily.    . cetirizine (ZYRTEC) 10 MG  tablet Take 10 mg by mouth daily.    . Cholecalciferol (VITAMIN D-3 PO) Take 1 tablet by mouth daily.    Marland Kitchen EPINEPHrine (ADRENALIN) 1 MG/ML injection Inject 1 mg as directed as needed for anaphylaxis. Reported on 02/01/2016    . ezetimibe (ZETIA) 10 MG tablet Take 1 tablet (10 mg total) by mouth daily. 30 tablet 6  . lidocaine-prilocaine (EMLA) cream Apply 1 application topically as needed. 30 g 6  . Lysine 500 MG CAPS Take 1 capsule by mouth 2 (two) times daily as needed (fever blisters).     . meclizine (ANTIVERT) 25 MG tablet Take 25 mg by mouth daily as needed for dizziness.     . Multiple Vitamins-Minerals (CENTRUM SILVER 50+WOMEN PO) Take 1 tablet by mouth daily.    . nitroGLYCERIN (NITROSTAT) 0.4 MG SL tablet Place 0.4 mg under the tongue every 5 (five) minutes as needed for chest pain. Reported on 02/01/2016    . Omega 3 1000 MG CAPS Take 1 each by mouth daily.     Marland Kitchen omeprazole (PRILOSEC) 40 MG capsule Take 40 mg by mouth daily.    . Prenatal Vit-Fe Fumarate-FA (PRENATAL VITAMIN PO) Take 1 tablet by mouth 2 (two) times daily.    . psyllium (METAMUCIL) 58.6 % powder Take 1 packet by mouth daily.    . rosuvastatin (CRESTOR) 10 MG tablet Take 10 mg by mouth daily.  3  . sotalol (BETAPACE) 160 MG tablet TAKE 1 TABLET (160 MG TOTAL) BY MOUTH 2 (TWO) TIMES DAILY. 60 tablet 10   No current facility-administered medications for this visit.    Facility-Administered Medications Ordered in Other Visits  Medication Dose Route Frequency Provider Last Rate Last Dose  .  Immune Globulin 10% (PRIVIGEN) IV infusion 55 g  55 g Intravenous Once Alvy Bimler, Elex Mainwaring, MD      . sodium chloride flush (NS) 0.9 % injection 10 mL  10 mL Intravenous PRN Alvy Bimler, Mosiah Bastin, MD   10 mL at 09/22/17 1322     REVIEW OF SYSTEMS:   Constitutional: Denies fevers, chills or night sweats Eyes: Denies blurriness of vision Ears, nose, mouth, throat, and face: Denies mucositis or sore throat Respiratory: Denies cough, dyspnea or  wheezes Cardiovascular: Denies palpitation, chest discomfort or lower extremity swelling Gastrointestinal:  Denies nausea, heartburn or change in bowel habits Skin: Denies abnormal skin rashes Lymphatics: Denies new lymphadenopathy or easy bruising Neurological:Denies numbness, tingling or new weaknesses Behavioral/Psych: Mood is stable, no new changes  All other systems were reviewed with the patient and are negative.  PHYSICAL EXAMINATION: ECOG PERFORMANCE STATUS: 1 - Symptomatic but completely ambulatory  Vitals:   11/17/17 0854  BP: 127/63  Pulse: (!) 54  Resp: 18  Temp: 97.8 F (36.6 C)  SpO2: 100%   Filed Weights   11/17/17 0854  Weight: 138 lb 3.2 oz (62.7 kg)    GENERAL:alert, no distress and comfortable SKIN: skin color, texture, turgor are normal, no rashes or significant lesions EYES: normal, Conjunctiva are pink and non-injected, sclera clear OROPHARYNX:no exudate, no erythema and lips, buccal mucosa, and tongue normal  NECK: supple, thyroid normal size, non-tender, without nodularity LYMPH:  no palpable lymphadenopathy in the cervical, axillary or inguinal LUNGS: clear to auscultation and percussion with normal breathing effort HEART: regular rate & rhythm with soft systolic murmur on the left sternal border and no lower extremity edema ABDOMEN:abdomen soft, non-tender and normal bowel sounds Musculoskeletal:no cyanosis of digits and no clubbing  NEURO: alert & oriented x 3 with fluent speech, no focal motor/sensory deficits  LABORATORY DATA:  I have reviewed the data as listed     Component Value Date/Time   NA 137 07/11/2017 0933   NA 142 05/05/2017 0850   K 4.3 07/11/2017 0933   K 4.0 05/05/2017 0850   CL 104 07/11/2017 0933   CL 104 08/02/2012 1515   CO2 24 07/11/2017 0933   CO2 25 05/05/2017 0850   GLUCOSE 110 (H) 07/11/2017 0933   GLUCOSE 130 05/05/2017 0850   GLUCOSE 105 (H) 08/02/2012 1515   BUN 17 07/11/2017 0933   BUN 18.9 05/05/2017 0850    CREATININE 0.97 07/11/2017 0933   CREATININE 0.9 05/05/2017 0850   CALCIUM 9.7 07/11/2017 0933   CALCIUM 10.0 05/05/2017 0850   PROT 6.9 05/05/2017 0850   ALBUMIN 3.8 05/05/2017 0850   AST 19 05/05/2017 0850   ALT 14 05/05/2017 0850   ALKPHOS 54 05/05/2017 0850   BILITOT 0.48 05/05/2017 0850   GFRNONAA 53 (L) 07/11/2017 0933   GFRAA >60 07/11/2017 0933    No results found for: SPEP, UPEP  Lab Results  Component Value Date   WBC 5.0 11/17/2017   NEUTROABS 2.1 11/17/2017   HGB 11.5 (L) 11/17/2017   HCT 35.1 11/17/2017   MCV 88.2 11/17/2017   PLT 100 (L) 11/17/2017      Chemistry      Component Value Date/Time   NA 137 07/11/2017 0933   NA 142 05/05/2017 0850   K 4.3 07/11/2017 0933   K 4.0 05/05/2017 0850   CL 104 07/11/2017 0933   CL 104 08/02/2012 1515   CO2 24 07/11/2017 0933   CO2 25 05/05/2017 0850   BUN 17 07/11/2017 0933  BUN 18.9 05/05/2017 0850   CREATININE 0.97 07/11/2017 0933   CREATININE 0.9 05/05/2017 0850      Component Value Date/Time   CALCIUM 9.7 07/11/2017 0933   CALCIUM 10.0 05/05/2017 0850   ALKPHOS 54 05/05/2017 0850   AST 19 05/05/2017 0850   ALT 14 05/05/2017 0850   BILITOT 0.48 05/05/2017 0850       ASSESSMENT & PLAN:  Idiopathic thrombocytopenic purpura (HCC) The patient has recurrent ITP, with poor tolerance to steroid treatment, subsequently had near complete response to IVIG Given her age and prior history of stroke, I also discussed possibility of getting IVIG at home through partnership with advance home care but the patient declined For now, the plan would be monthly IVIG I plan to see her back again at the end of the month with her next infusion  Hypertrophic obstructive cardiomyopathy ([facility]) Examination today is satisfactory without any signs of congestive heart failure I would defer to her cardiologist for further management  Poor memory The patient appears to have poor memory with possible early signs of  dementia She appears to be coping well under the care of her husband    No orders of the defined types were placed in this encounter.   All questions were answered. The patient knows to call the clinic with any problems, questions or concerns. No barriers to learning was detected.  I spent 15 minutes counseling the patient face to face. The total time spent in the appointment was 20 minutes and more than 50% was on counseling.     Heath Lark, MD 3/1/20199:33 AM

## 2017-11-17 NOTE — Assessment & Plan Note (Signed)
The patient appears to have poor memory with possible early signs of dementia She appears to be coping well under the care of her husband

## 2017-11-17 NOTE — Telephone Encounter (Signed)
Gave avs and calendar for march °

## 2017-11-17 NOTE — Assessment & Plan Note (Signed)
The patient has recurrent ITP, with poor tolerance to steroid treatment, subsequently had near complete response to IVIG Given her age and prior history of stroke, I also discussed possibility of getting IVIG at home through partnership with advance home care but the patient declined For now, the plan would be monthly IVIG I plan to see her back again at the end of the month with her next infusion

## 2017-11-17 NOTE — Patient Instructions (Signed)

## 2017-12-06 DIAGNOSIS — H5212 Myopia, left eye: Secondary | ICD-10-CM | POA: Diagnosis not present

## 2017-12-13 NOTE — Progress Notes (Signed)
Updating information received from [employer] on IVIG doses.  Call received in pharmacy on 11/10/17 by Raul Del PharmD from [place], [place] [place].  Information provided:  Patients IVIG dose needed to be based on Adjusted Body Weight per insurance guidelines and that would limit dose to 55 gm. This was remarked the change was due to payment purposes.   Raul Del, PharmD could not find any documentation leading to a dose cap.  Current authorization is from 1/30-7/30/19.    All future doses will be limited to 55 gm unless new authorization if initiated.  [facility] Clinical Pharmacist [phone] ext [phone].  Information was transcribed to chart by:  Henreitta Leber, PharmD 12/13/17 @ 1537.

## 2017-12-15 ENCOUNTER — Telehealth: Payer: Self-pay | Admitting: Hematology and Oncology

## 2017-12-15 ENCOUNTER — Inpatient Hospital Stay: Payer: Medicare HMO

## 2017-12-15 ENCOUNTER — Encounter: Payer: Self-pay | Admitting: Hematology and Oncology

## 2017-12-15 ENCOUNTER — Inpatient Hospital Stay (HOSPITAL_BASED_OUTPATIENT_CLINIC_OR_DEPARTMENT_OTHER): Payer: Medicare HMO | Admitting: Hematology and Oncology

## 2017-12-15 VITALS — BP 150/72 | HR 51 | Temp 98.4°F | Resp 18

## 2017-12-15 DIAGNOSIS — I421 Obstructive hypertrophic cardiomyopathy: Secondary | ICD-10-CM

## 2017-12-15 DIAGNOSIS — Z79899 Other long term (current) drug therapy: Secondary | ICD-10-CM | POA: Diagnosis not present

## 2017-12-15 DIAGNOSIS — D693 Immune thrombocytopenic purpura: Secondary | ICD-10-CM

## 2017-12-15 DIAGNOSIS — K649 Unspecified hemorrhoids: Secondary | ICD-10-CM

## 2017-12-15 DIAGNOSIS — Z95828 Presence of other vascular implants and grafts: Secondary | ICD-10-CM

## 2017-12-15 LAB — CBC WITH DIFFERENTIAL/PLATELET
Basophils Absolute: 0 10*3/uL (ref 0.0–0.1)
Basophils Relative: 0 %
EOS ABS: 0.1 10*3/uL (ref 0.0–0.5)
EOS PCT: 1 %
HCT: 35.4 % (ref 34.8–46.6)
Hemoglobin: 11.8 g/dL (ref 11.6–15.9)
Lymphocytes Relative: 38 %
Lymphs Abs: 2.3 10*3/uL (ref 0.9–3.3)
MCH: 29.4 pg (ref 25.1–34.0)
MCHC: 33.3 g/dL (ref 31.5–36.0)
MCV: 88.3 fL (ref 79.5–101.0)
MONO ABS: 0.6 10*3/uL (ref 0.1–0.9)
MONOS PCT: 10 %
Neutro Abs: 3.1 10*3/uL (ref 1.5–6.5)
Neutrophils Relative %: 51 %
Platelets: 154 10*3/uL (ref 145–400)
RBC: 4.01 MIL/uL (ref 3.70–5.45)
RDW: 14 % (ref 11.2–14.5)
WBC: 6 10*3/uL (ref 3.9–10.3)

## 2017-12-15 MED ORDER — SODIUM CHLORIDE 0.9% FLUSH
10.0000 mL | Freq: Once | INTRAVENOUS | Status: AC
  Administered 2017-12-15: 10 mL
  Filled 2017-12-15: qty 10

## 2017-12-15 MED ORDER — DIPHENHYDRAMINE HCL 25 MG PO CAPS
ORAL_CAPSULE | ORAL | Status: AC
  Filled 2017-12-15: qty 1

## 2017-12-15 MED ORDER — IMMUNE GLOBULIN (HUMAN) 20 GM/200ML IV SOLN
55.0000 g | Freq: Once | INTRAVENOUS | Status: DC
  Filled 2017-12-15: qty 50

## 2017-12-15 MED ORDER — FAMOTIDINE 20 MG PO TABS
ORAL_TABLET | ORAL | Status: AC
  Filled 2017-12-15: qty 1

## 2017-12-15 MED ORDER — DIPHENHYDRAMINE HCL 25 MG PO TABS
25.0000 mg | ORAL_TABLET | Freq: Once | ORAL | Status: AC
  Administered 2017-12-15: 25 mg via ORAL
  Filled 2017-12-15: qty 1

## 2017-12-15 MED ORDER — ACETAMINOPHEN 325 MG PO TABS
650.0000 mg | ORAL_TABLET | Freq: Once | ORAL | Status: AC
  Administered 2017-12-15: 650 mg via ORAL

## 2017-12-15 MED ORDER — IMMUNE GLOBULIN (HUMAN) 5 GM/50ML IV SOLN
55.0000 g | Freq: Once | INTRAVENOUS | Status: AC
  Administered 2017-12-15: 55 g via INTRAVENOUS
  Filled 2017-12-15: qty 50

## 2017-12-15 MED ORDER — HEPARIN SOD (PORK) LOCK FLUSH 100 UNIT/ML IV SOLN
500.0000 [IU] | Freq: Once | INTRAVENOUS | Status: AC
  Administered 2017-12-15: 500 [IU]
  Filled 2017-12-15: qty 5

## 2017-12-15 MED ORDER — ACETAMINOPHEN 325 MG PO TABS
ORAL_TABLET | ORAL | Status: AC
  Filled 2017-12-15: qty 2

## 2017-12-15 MED ORDER — FAMOTIDINE 20 MG PO TABS
20.0000 mg | ORAL_TABLET | Freq: Once | ORAL | Status: AC
  Administered 2017-12-15: 20 mg via ORAL

## 2017-12-15 NOTE — Telephone Encounter (Signed)
Gave patient AVs and calendar of upcoming April appointments.  °

## 2017-12-15 NOTE — Patient Instructions (Signed)

## 2017-12-15 NOTE — Progress Notes (Signed)
Pt declined to stay for entire 30 min post observation.  VS stable.  A&Ox4.

## 2017-12-15 NOTE — Assessment & Plan Note (Addendum)
The patient has recurrent ITP, with poor tolerance to steroid treatment, subsequently had near complete response to IVIG Given her age and prior history of stroke, I also discussed possibility of getting IVIG at home through partnership with advance home care but the patient declined For now, the plan would be monthly IVIG Her platelet count looks excellent today I plan to stretch her next appointment to 31 days rather than 28 days

## 2017-12-15 NOTE — Assessment & Plan Note (Signed)
Examination today is satisfactory without any signs of congestive heart failure I would defer to her cardiologist for further management 

## 2017-12-15 NOTE — Progress Notes (Signed)
[facility] OFFICE PROGRESS NOTE  [name], MD  ASSESSMENT & PLAN:  Idiopathic thrombocytopenic purpura [facility]) The patient has recurrent ITP, with poor tolerance to steroid treatment, subsequently had near complete response to IVIG Given her age and prior history of stroke, I also discussed possibility of getting IVIG at home through partnership with advance home care but the patient declined For now, the plan would be monthly IVIG Her platelet count looks excellent today I plan to stretch her next appointment to 31 days rather than 28 days   Hypertrophic obstructive cardiomyopathy ([facility]) Examination today is satisfactory without any signs of congestive heart failure I would defer to her cardiologist for further management  Bleeding hemorrhoid She has intermittent hemorrhoidal bleeding once a month; it is minor I recommend conservative management Platelet count is stable well over 100,000 and I do not think this contributed to her bleeding   No orders of the defined types were placed in this encounter.   INTERVAL HISTORY: [name] 84 y.o. female returns for further follow-up and for her IVIG infusion She has mild intermittent hemorrhoidal bleeding once a month Denies other forms of bleeding No recent infection She denies serum sickness No recent heart issues   SUMMARY OF HEMATOLOGIC HISTORY:  This is a pleasant lady with chronic thrombocytopenia. The patient was discovered to have low platelet count when she was placed on Plavix for coronary artery disease. 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 She is being observed. On 01/04/2016, she was started on prednisone therapy for ITP when her platelet count dip under  50,000 May 2017, she tolerated prednisone poorly and prednisone  is slowly tapered off 05/05/2017, she had relapsed ITP and started on IVIG with complete response to Rx but with platelet count trending down after 4 weeks. She is then scheduled for IVIG monthly  I have reviewed the past medical history, past surgical history, social history and family history with the patient and they are unchanged from previous note.  ALLERGIES:  is allergic to bee venom; iodides; lyrica [pregabalin]; codeine; demerol [meperidine]; ambien [zolpidem tartrate]; aspirin; celebrex [celecoxib]; darvocet [propoxyphene n-acetaminophen]; fosamax [alendronate sodium]; lipitor [atorvastatin]; morphine and related; valium [diazepam]; and iodine.  MEDICATIONS:  Current Outpatient Medications  Medication Sig Dispense Refill  . amLODipine (NORVASC) 5 MG tablet Take 1 tablet (5 mg total) by mouth daily. 90 tablet 3  . calcium citrate (CALCITRATE - DOSED IN MG ELEMENTAL CALCIUM) 950 MG tablet Take 1 tablet by mouth daily.    . cetirizine (ZYRTEC) 10 MG tablet Take 10 mg by mouth daily.    . Cholecalciferol (VITAMIN D-3 PO) Take 1 tablet by mouth daily.    Marland Kitchen EPINEPHrine (ADRENALIN) 1 MG/ML injection Inject 1 mg as directed as needed for anaphylaxis. Reported on 02/01/2016    . ezetimibe (ZETIA) 10 MG tablet Take 1 tablet (10 mg total) by mouth daily. 30 tablet 6  . lidocaine-prilocaine (EMLA) cream Apply 1 application topically as needed. 30 g 6  . Lysine 500 MG CAPS Take 1 capsule by mouth 2 (two) times daily as needed (fever blisters).     . meclizine (ANTIVERT) 25 MG tablet Take 25 mg by mouth daily as needed for dizziness.     . Multiple Vitamins-Minerals (CENTRUM SILVER 50+WOMEN PO) Take 1 tablet by mouth daily.    . nitroGLYCERIN (NITROSTAT)  0.4 MG SL tablet Place 0.4 mg under the tongue every 5 (five) minutes as needed for chest pain. Reported on 02/01/2016    . Omega 3 1000 MG CAPS Take 1 each by mouth daily.     Marland Kitchen omeprazole (PRILOSEC) 40 MG capsule Take 40 mg by mouth daily.    .  Prenatal Vit-Fe Fumarate-FA (PRENATAL VITAMIN PO) Take 1 tablet by mouth 2 (two) times daily.    . psyllium (METAMUCIL) 58.6 % powder Take 1 packet by mouth daily.    . rosuvastatin (CRESTOR) 10 MG tablet Take 10 mg by mouth daily.  3  . sotalol (BETAPACE) 160 MG tablet TAKE 1 TABLET (160 MG TOTAL) BY MOUTH 2 (TWO) TIMES DAILY. 60 tablet 10   No current facility-administered medications for this visit.    Facility-Administered Medications Ordered in Other Visits  Medication Dose Route Frequency Provider Last Rate Last Dose  . sodium chloride flush (NS) 0.9 % injection 10 mL  10 mL Intravenous PRN Alvy Bimler, Edenilson Austad, MD   10 mL at 09/22/17 1322     REVIEW OF SYSTEMS:   Constitutional: Denies fevers, chills or night sweats Eyes: Denies blurriness of vision Ears, nose, mouth, throat, and face: Denies mucositis or sore throat Respiratory: Denies cough, dyspnea or wheezes Cardiovascular: Denies palpitation, chest discomfort or lower extremity swelling Gastrointestinal:  Denies nausea, heartburn or change in bowel habits Skin: Denies abnormal skin rashes Lymphatics: Denies new lymphadenopathy or easy bruising Neurological:Denies numbness, tingling or new weaknesses Behavioral/Psych: Mood is stable, no new changes  All other systems were reviewed with the patient and are negative.  PHYSICAL EXAMINATION: ECOG PERFORMANCE STATUS: 0 - Asymptomatic  Vitals:   12/15/17 0844  BP: (!) 127/59  Pulse: (!) 58  Resp: 18  Temp: 97.9 F (36.6 C)  SpO2: 100%   Filed Weights   12/15/17 0844  Weight: 139 lb 4.8 oz (63.2 kg)    GENERAL:alert, no distress and comfortable SKIN: skin color, texture, turgor are normal, no rashes or significant lesions EYES: normal, Conjunctiva are pink and non-injected, sclera clear OROPHARYNX:no exudate, no erythema and lips, buccal mucosa, and tongue normal  NECK: supple, thyroid normal size, non-tender, without nodularity LYMPH:  no palpable lymphadenopathy in the  cervical, axillary or inguinal LUNGS: clear to auscultation and percussion with normal breathing effort HEART: regular rate & rhythm with soft systolic murmurs and no lower extremity edema ABDOMEN:abdomen soft, non-tender and normal bowel sounds Musculoskeletal:no cyanosis of digits and no clubbing  NEURO: alert & oriented x 3 with fluent speech, no focal motor/sensory deficits  LABORATORY DATA:  I have reviewed the data as listed     Component Value Date/Time   NA 137 07/11/2017 0933   NA 142 05/05/2017 0850   K 4.3 07/11/2017 0933   K 4.0 05/05/2017 0850   CL 104 07/11/2017 0933   CL 104 08/02/2012 1515   CO2 24 07/11/2017 0933   CO2 25 05/05/2017 0850   GLUCOSE 110 (H) 07/11/2017 0933   GLUCOSE 130 05/05/2017 0850   GLUCOSE 105 (H) 08/02/2012 1515   BUN 17 07/11/2017 0933   BUN 18.9 05/05/2017 0850   CREATININE 0.97 07/11/2017 0933   CREATININE 0.9 05/05/2017 0850   CALCIUM 9.7 07/11/2017 0933   CALCIUM 10.0 05/05/2017 0850   PROT 6.9 05/05/2017 0850   ALBUMIN 3.8 05/05/2017 0850   AST 19 05/05/2017 0850   ALT 14 05/05/2017 0850   ALKPHOS 54 05/05/2017 0850   BILITOT 0.48 05/05/2017 0850   GFRNONAA  53 (L) 07/11/2017 0933   GFRAA >60 07/11/2017 0933    No results found for: SPEP, UPEP  Lab Results  Component Value Date   WBC 6.0 12/15/2017   NEUTROABS 3.1 12/15/2017   HGB 11.8 12/15/2017   HCT 35.4 12/15/2017   MCV 88.3 12/15/2017   PLT 154 12/15/2017      Chemistry      Component Value Date/Time   NA 137 07/11/2017 0933   NA 142 05/05/2017 0850   K 4.3 07/11/2017 0933   K 4.0 05/05/2017 0850   CL 104 07/11/2017 0933   CL 104 08/02/2012 1515   CO2 24 07/11/2017 0933   CO2 25 05/05/2017 0850   BUN 17 07/11/2017 0933   BUN 18.9 05/05/2017 0850   CREATININE 0.97 07/11/2017 0933   CREATININE 0.9 05/05/2017 0850      Component Value Date/Time   CALCIUM 9.7 07/11/2017 0933   CALCIUM 10.0 05/05/2017 0850   ALKPHOS 54 05/05/2017 0850   AST 19  05/05/2017 0850   ALT 14 05/05/2017 0850   BILITOT 0.48 05/05/2017 0850       I spent 15 minutes counseling the patient face to face. The total time spent in the appointment was 20 minutes and more than 50% was on counseling.   All questions were answered. The patient knows to call the clinic with any problems, questions or concerns. No barriers to learning was detected.    Heath Lark, MD 3/29/20198:49 AM

## 2017-12-15 NOTE — Assessment & Plan Note (Signed)
She has intermittent hemorrhoidal bleeding once a month; it is minor I recommend conservative management Platelet count is stable well over 100,000 and I do not think this contributed to her bleeding

## 2017-12-25 DIAGNOSIS — I2510 Atherosclerotic heart disease of native coronary artery without angina pectoris: Secondary | ICD-10-CM | POA: Diagnosis not present

## 2017-12-25 DIAGNOSIS — R413 Other amnesia: Secondary | ICD-10-CM | POA: Diagnosis not present

## 2017-12-25 DIAGNOSIS — I10 Essential (primary) hypertension: Secondary | ICD-10-CM | POA: Diagnosis not present

## 2017-12-25 DIAGNOSIS — E782 Mixed hyperlipidemia: Secondary | ICD-10-CM | POA: Diagnosis not present

## 2017-12-25 DIAGNOSIS — D693 Immune thrombocytopenic purpura: Secondary | ICD-10-CM | POA: Diagnosis not present

## 2017-12-25 DIAGNOSIS — K219 Gastro-esophageal reflux disease without esophagitis: Secondary | ICD-10-CM | POA: Diagnosis not present

## 2017-12-28 DIAGNOSIS — H538 Other visual disturbances: Secondary | ICD-10-CM | POA: Diagnosis not present

## 2018-01-08 DIAGNOSIS — H53453 Other localized visual field defect, bilateral: Secondary | ICD-10-CM | POA: Diagnosis not present

## 2018-01-16 ENCOUNTER — Inpatient Hospital Stay: Payer: Medicare HMO

## 2018-01-16 ENCOUNTER — Encounter: Payer: Self-pay | Admitting: Hematology and Oncology

## 2018-01-16 ENCOUNTER — Inpatient Hospital Stay (HOSPITAL_BASED_OUTPATIENT_CLINIC_OR_DEPARTMENT_OTHER): Payer: Medicare HMO | Admitting: Hematology and Oncology

## 2018-01-16 ENCOUNTER — Inpatient Hospital Stay: Payer: Medicare HMO | Attending: Hematology and Oncology

## 2018-01-16 VITALS — BP 130/54 | HR 48 | Temp 98.0°F | Resp 16

## 2018-01-16 DIAGNOSIS — D693 Immune thrombocytopenic purpura: Secondary | ICD-10-CM

## 2018-01-16 DIAGNOSIS — Z79899 Other long term (current) drug therapy: Secondary | ICD-10-CM | POA: Insufficient documentation

## 2018-01-16 DIAGNOSIS — I421 Obstructive hypertrophic cardiomyopathy: Secondary | ICD-10-CM | POA: Insufficient documentation

## 2018-01-16 DIAGNOSIS — Z598 Other problems related to housing and economic circumstances: Secondary | ICD-10-CM

## 2018-01-16 DIAGNOSIS — Z95828 Presence of other vascular implants and grafts: Secondary | ICD-10-CM

## 2018-01-16 DIAGNOSIS — Z599 Problem related to housing and economic circumstances, unspecified: Secondary | ICD-10-CM

## 2018-01-16 LAB — CBC WITH DIFFERENTIAL/PLATELET
Basophils Absolute: 0 10*3/uL (ref 0.0–0.1)
Basophils Relative: 1 %
EOS ABS: 0.1 10*3/uL (ref 0.0–0.5)
Eosinophils Relative: 1 %
HEMATOCRIT: 35.2 % (ref 34.8–46.6)
Hemoglobin: 11.8 g/dL (ref 11.6–15.9)
LYMPHS ABS: 1.9 10*3/uL (ref 0.9–3.3)
Lymphocytes Relative: 40 %
MCH: 29 pg (ref 25.1–34.0)
MCHC: 33.5 g/dL (ref 31.5–36.0)
MCV: 86.6 fL (ref 79.5–101.0)
MONO ABS: 0.5 10*3/uL (ref 0.1–0.9)
MONOS PCT: 10 %
NEUTROS ABS: 2.3 10*3/uL (ref 1.5–6.5)
NEUTROS PCT: 48 %
Platelets: 88 10*3/uL — ABNORMAL LOW (ref 145–400)
RBC: 4.07 MIL/uL (ref 3.70–5.45)
RDW: 14.2 % (ref 11.2–14.5)
WBC: 4.9 10*3/uL (ref 3.9–10.3)

## 2018-01-16 MED ORDER — ACETAMINOPHEN 325 MG PO TABS
ORAL_TABLET | ORAL | Status: AC
  Filled 2018-01-16: qty 2

## 2018-01-16 MED ORDER — FAMOTIDINE 20 MG PO TABS
ORAL_TABLET | ORAL | Status: AC
Start: 2018-01-16 — End: ?
  Filled 2018-01-16: qty 1

## 2018-01-16 MED ORDER — FAMOTIDINE 20 MG PO TABS
20.0000 mg | ORAL_TABLET | Freq: Once | ORAL | Status: AC
  Administered 2018-01-16: 20 mg via ORAL

## 2018-01-16 MED ORDER — SODIUM CHLORIDE 0.9% FLUSH
10.0000 mL | Freq: Once | INTRAVENOUS | Status: AC
  Administered 2018-01-16: 10 mL
  Filled 2018-01-16: qty 10

## 2018-01-16 MED ORDER — DIPHENHYDRAMINE HCL 25 MG PO CAPS
ORAL_CAPSULE | ORAL | Status: AC
  Filled 2018-01-16: qty 1

## 2018-01-16 MED ORDER — ACETAMINOPHEN 325 MG PO TABS
650.0000 mg | ORAL_TABLET | Freq: Once | ORAL | Status: AC
  Administered 2018-01-16: 650 mg via ORAL

## 2018-01-16 MED ORDER — IMMUNE GLOBULIN (HUMAN) 5 GM/50ML IV SOLN
55.0000 g | Freq: Once | INTRAVENOUS | Status: AC
  Administered 2018-01-16: 55 g via INTRAVENOUS
  Filled 2018-01-16: qty 50

## 2018-01-16 MED ORDER — DIPHENHYDRAMINE HCL 25 MG PO TABS
25.0000 mg | ORAL_TABLET | Freq: Once | ORAL | Status: AC
  Administered 2018-01-16: 25 mg via ORAL
  Filled 2018-01-16: qty 1

## 2018-01-16 NOTE — Progress Notes (Signed)
Patient/spouse had questions regarding billing. Reviewed and had Lenise explain to him and print off explanation.  She also offered copay assistance through [employer] and advised it would require income. He states they would not qualify. She gave him the income guideline and he said he has the capability of paying but wrote down the information in case he changed his mind later.

## 2018-01-16 NOTE — Assessment & Plan Note (Signed)
The patient has recurrent ITP, with poor tolerance to steroid treatment, subsequently had near complete response to IVIG For now, the plan would be monthly IVIG Her platelet count looks a little reduced but overall stable I plan to see her back again in the month for further treatment

## 2018-01-16 NOTE — Patient Instructions (Signed)

## 2018-01-16 NOTE — Assessment & Plan Note (Signed)
Examination today is satisfactory without any signs of congestive heart failure I would defer to her cardiologist for further management 

## 2018-01-16 NOTE — Assessment & Plan Note (Signed)
The patient's husband is expressing significant disappointment and concerns regarding recent bills. He is requesting to transfer her care somewhere because he cannot afford the treatment here After extensive discussion, he is willing to speak with our financial counselor

## 2018-01-16 NOTE — Progress Notes (Signed)
[name] OFFICE PROGRESS NOTE  Adrienne Ada, MD  ASSESSMENT & PLAN:  Idiopathic thrombocytopenic purpura ([name]) The patient has recurrent ITP, with poor tolerance to steroid treatment, subsequently had near complete response to IVIG For now, the plan would be monthly IVIG Her platelet count looks a little reduced but overall stable I plan to see her back again in the month for further treatment   Hypertrophic obstructive cardiomyopathy ([place]) Examination today is satisfactory without any signs of congestive heart failure I would defer to her cardiologist for further management  Financial difficulties The patient's husband is expressing significant disappointment and concerns regarding recent bills. He is requesting to transfer her care somewhere because he cannot afford the treatment here After extensive discussion, he is willing to speak with our financial counselor    No orders of the defined types were placed in this encounter.   INTERVAL HISTORY: [name] 84 y.o. female returns for further follow-up. She tolerated recent treatment well She denies infusion reaction The patient denies any recent signs or symptoms of bleeding such as spontaneous epistaxis, hematuria or hematochezia. Her husband brought multiple bills with him today.  He is concerned about recent excessive billing and is requesting transfer of care to another institution  SUMMARY OF HEMATOLOGIC HISTORY:  This is a pleasant lady with chronic thrombocytopenia. The patient was discovered to have low platelet count when she was placed on Plavix for coronary artery disease. 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 She is being observed. On 01/04/2016, she was started on prednisone therapy for ITP when her platelet  count dip under  50,000 May 2017, she tolerated prednisone poorly and prednisone is slowly tapered off 05/05/2017, she had relapsed ITP and started on IVIG with complete response to Rx but with platelet count trending down after 4 weeks. She is then scheduled for IVIG monthly  I have reviewed the past medical history, past surgical history, social history and family history with the patient and they are unchanged from previous note.  ALLERGIES:  is allergic to bee venom; iodides; lyrica [pregabalin]; codeine; demerol [meperidine]; ambien [zolpidem tartrate]; aspirin; celebrex [celecoxib]; darvocet [propoxyphene n-acetaminophen]; fosamax [alendronate sodium]; lipitor [atorvastatin]; morphine and related; valium [diazepam]; and iodine.  MEDICATIONS:  Current Outpatient Medications  Medication Sig Dispense Refill  . amLODipine (NORVASC) 5 MG tablet Take 1 tablet (5 mg total) by mouth daily. 90 tablet 3  . calcium citrate (CALCITRATE - DOSED IN MG ELEMENTAL CALCIUM) 950 MG tablet Take 1 tablet by mouth daily.    . cetirizine (ZYRTEC) 10 MG tablet Take 10 mg by mouth daily.    . Cholecalciferol (VITAMIN D-3 PO) Take 1 tablet by mouth daily.    Marland Kitchen EPINEPHrine (ADRENALIN) 1 MG/ML injection Inject 1 mg as directed as needed for anaphylaxis. Reported on 02/01/2016    . ezetimibe (ZETIA) 10 MG tablet Take 1 tablet (10 mg total) by mouth daily. 30 tablet 6  . lidocaine-prilocaine (EMLA) cream Apply 1 application topically as needed. 30 g 6  . Lysine 500 MG CAPS Take 1 capsule by mouth 2 (two) times daily as needed (fever blisters).     . meclizine (ANTIVERT) 25 MG tablet Take 25 mg by mouth daily as needed for dizziness.     . Multiple Vitamins-Minerals (CENTRUM SILVER 50+WOMEN PO) Take 1 tablet by mouth daily.    Marland Kitchen  nitroGLYCERIN (NITROSTAT) 0.4 MG SL tablet Place 0.4 mg under the tongue every 5 (five) minutes as needed for chest pain. Reported on 02/01/2016    . Omega 3 1000 MG CAPS Take 1 each by mouth  daily.     Marland Kitchen omeprazole (PRILOSEC) 40 MG capsule Take 40 mg by mouth daily.    . Prenatal Vit-Fe Fumarate-FA (PRENATAL VITAMIN PO) Take 1 tablet by mouth 2 (two) times daily.    . psyllium (METAMUCIL) 58.6 % powder Take 1 packet by mouth daily.    . rosuvastatin (CRESTOR) 10 MG tablet Take 10 mg by mouth daily.  3  . sotalol (BETAPACE) 160 MG tablet TAKE 1 TABLET (160 MG TOTAL) BY MOUTH 2 (TWO) TIMES DAILY. 60 tablet 10   No current facility-administered medications for this visit.    Facility-Administered Medications Ordered in Other Visits  Medication Dose Route Frequency Provider Last Rate Last Dose  . sodium chloride flush (NS) 0.9 % injection 10 mL  10 mL Intravenous PRN Alvy Bimler, [place] Paster, MD   10 mL at 09/22/17 1322     REVIEW OF SYSTEMS:   Constitutional: Denies fevers, chills or night sweats Eyes: Denies blurriness of vision Ears, nose, mouth, throat, and face: Denies mucositis or sore throat Respiratory: Denies cough, dyspnea or wheezes Cardiovascular: Denies palpitation, chest discomfort or lower extremity swelling Gastrointestinal:  Denies nausea, heartburn or change in bowel habits Skin: Denies abnormal skin rashes Lymphatics: Denies new lymphadenopathy or easy bruising Neurological:Denies numbness, tingling or new weaknesses Behavioral/Psych: Mood is stable, no new changes  All other systems were reviewed with the patient and are negative.  PHYSICAL EXAMINATION: ECOG PERFORMANCE STATUS: 1 - Symptomatic but completely ambulatory  Vitals:   01/16/18 0852  BP: (!) 127/57  Pulse: (!) 51  Resp: 18  Temp: 98.1 F (36.7 C)  SpO2: 100%   Filed Weights   01/16/18 0852  Weight: 138 lb 12.8 oz (63 kg)    GENERAL:alert, no distress and comfortable SKIN: skin color, texture, turgor are normal, no rashes or significant lesions EYES: normal, Conjunctiva are pink and non-injected, sclera clear OROPHARYNX:no exudate, no erythema and lips, buccal mucosa, and tongue normal  NECK:  supple, thyroid normal size, non-tender, without nodularity LYMPH:  no palpable lymphadenopathy in the cervical, axillary or inguinal LUNGS: clear to auscultation and percussion with normal breathing effort HEART: regular rate & rhythm and no murmurs and no lower extremity edema ABDOMEN:abdomen soft, non-tender and normal bowel sounds Musculoskeletal:no cyanosis of digits and no clubbing  NEURO: alert & oriented x 3 with fluent speech, no focal motor/sensory deficits  LABORATORY DATA:  I have reviewed the data as listed     Component Value Date/Time   NA 137 07/11/2017 0933   NA 142 05/05/2017 0850   K 4.3 07/11/2017 0933   K 4.0 05/05/2017 0850   CL 104 07/11/2017 0933   CL 104 08/02/2012 1515   CO2 24 07/11/2017 0933   CO2 25 05/05/2017 0850   GLUCOSE 110 (H) 07/11/2017 0933   GLUCOSE 130 05/05/2017 0850   GLUCOSE 105 (H) 08/02/2012 1515   BUN 17 07/11/2017 0933   BUN 18.9 05/05/2017 0850   CREATININE 0.97 07/11/2017 0933   CREATININE 0.9 05/05/2017 0850   CALCIUM 9.7 07/11/2017 0933   CALCIUM 10.0 05/05/2017 0850   PROT 6.9 05/05/2017 0850   ALBUMIN 3.8 05/05/2017 0850   AST 19 05/05/2017 0850   ALT 14 05/05/2017 0850   ALKPHOS 54 05/05/2017 0850   BILITOT 0.48 05/05/2017  [place] (L) 07/11/2017 0933   GFRAA >60 07/11/2017 0933    No results found for: SPEP, UPEP  Lab Results  Component Value Date   WBC 4.9 01/16/2018   NEUTROABS 2.3 01/16/2018   HGB 11.8 01/16/2018   HCT 35.2 01/16/2018   MCV 86.6 01/16/2018   PLT 88 (L) 01/16/2018      Chemistry      Component Value Date/Time   NA 137 07/11/2017 0933   NA 142 05/05/2017 0850   K 4.3 07/11/2017 0933   K 4.0 05/05/2017 0850   CL 104 07/11/2017 0933   CL 104 08/02/2012 1515   CO2 24 07/11/2017 0933   CO2 25 05/05/2017 0850   BUN 17 07/11/2017 0933   BUN 18.9 05/05/2017 0850   CREATININE 0.97 07/11/2017 0933   CREATININE 0.9 05/05/2017 0850      Component Value Date/Time   CALCIUM 9.7  07/11/2017 0933   CALCIUM 10.0 05/05/2017 0850   ALKPHOS 54 05/05/2017 0850   AST 19 05/05/2017 0850   ALT 14 05/05/2017 0850   BILITOT 0.48 05/05/2017 0850      I spent 15 minutes counseling the patient face to face. The total time spent in the appointment was 20 minutes and more than 50% was on counseling.   All questions were answered. The patient knows to call the clinic with any problems, questions or concerns. No barriers to learning was detected.    Heath Lark, MD 4/30/20193:54 PM

## 2018-02-08 ENCOUNTER — Encounter: Payer: Self-pay | Admitting: Hematology and Oncology

## 2018-02-08 ENCOUNTER — Inpatient Hospital Stay (HOSPITAL_BASED_OUTPATIENT_CLINIC_OR_DEPARTMENT_OTHER): Payer: Medicare HMO | Admitting: Hematology and Oncology

## 2018-02-08 ENCOUNTER — Inpatient Hospital Stay: Payer: Medicare HMO

## 2018-02-08 ENCOUNTER — Telehealth: Payer: Self-pay | Admitting: Hematology and Oncology

## 2018-02-08 ENCOUNTER — Inpatient Hospital Stay: Payer: Medicare HMO | Attending: Hematology and Oncology

## 2018-02-08 VITALS — BP 144/66 | HR 47 | Temp 98.2°F | Resp 16

## 2018-02-08 DIAGNOSIS — Z95828 Presence of other vascular implants and grafts: Secondary | ICD-10-CM

## 2018-02-08 DIAGNOSIS — D693 Immune thrombocytopenic purpura: Secondary | ICD-10-CM

## 2018-02-08 DIAGNOSIS — Z79899 Other long term (current) drug therapy: Secondary | ICD-10-CM | POA: Diagnosis not present

## 2018-02-08 DIAGNOSIS — K649 Unspecified hemorrhoids: Secondary | ICD-10-CM

## 2018-02-08 DIAGNOSIS — I421 Obstructive hypertrophic cardiomyopathy: Secondary | ICD-10-CM | POA: Diagnosis not present

## 2018-02-08 LAB — CBC WITH DIFFERENTIAL/PLATELET
BASOS ABS: 0 10*3/uL (ref 0.0–0.1)
BASOS PCT: 0 %
EOS PCT: 1 %
Eosinophils Absolute: 0.1 10*3/uL (ref 0.0–0.5)
HCT: 36.3 % (ref 34.8–46.6)
Hemoglobin: 11.8 g/dL (ref 11.6–15.9)
LYMPHS PCT: 45 %
Lymphs Abs: 2.3 10*3/uL (ref 0.9–3.3)
MCH: 28.5 pg (ref 25.1–34.0)
MCHC: 32.5 g/dL (ref 31.5–36.0)
MCV: 87.7 fL (ref 79.5–101.0)
Monocytes Absolute: 0.6 10*3/uL (ref 0.1–0.9)
Monocytes Relative: 11 %
Neutro Abs: 2.2 10*3/uL (ref 1.5–6.5)
Neutrophils Relative %: 43 %
PLATELETS: 111 10*3/uL — AB (ref 145–400)
RBC: 4.14 MIL/uL (ref 3.70–5.45)
RDW: 14.1 % (ref 11.2–14.5)
WBC: 5.2 10*3/uL (ref 3.9–10.3)

## 2018-02-08 MED ORDER — DIPHENHYDRAMINE HCL 25 MG PO TABS
25.0000 mg | ORAL_TABLET | Freq: Once | ORAL | Status: AC
  Administered 2018-02-08: 25 mg via ORAL
  Filled 2018-02-08: qty 1

## 2018-02-08 MED ORDER — ACETAMINOPHEN 325 MG PO TABS
ORAL_TABLET | ORAL | Status: AC
  Filled 2018-02-08: qty 2

## 2018-02-08 MED ORDER — SODIUM CHLORIDE 0.9% FLUSH
10.0000 mL | Freq: Once | INTRAVENOUS | Status: AC
  Administered 2018-02-08: 10 mL
  Filled 2018-02-08: qty 10

## 2018-02-08 MED ORDER — DIPHENHYDRAMINE HCL 25 MG PO CAPS
ORAL_CAPSULE | ORAL | Status: AC
  Filled 2018-02-08: qty 1

## 2018-02-08 MED ORDER — ACETAMINOPHEN 325 MG PO TABS
650.0000 mg | ORAL_TABLET | Freq: Once | ORAL | Status: AC
  Administered 2018-02-08: 650 mg via ORAL

## 2018-02-08 MED ORDER — IMMUNE GLOBULIN (HUMAN) 20 GM/200ML IV SOLN
55.0000 g | Freq: Once | INTRAVENOUS | Status: AC
  Administered 2018-02-08: 55 g via INTRAVENOUS
  Filled 2018-02-08: qty 400

## 2018-02-08 MED ORDER — FAMOTIDINE 20 MG PO TABS
ORAL_TABLET | ORAL | Status: AC
  Filled 2018-02-08: qty 1

## 2018-02-08 MED ORDER — HEPARIN SOD (PORK) LOCK FLUSH 100 UNIT/ML IV SOLN
500.0000 [IU] | Freq: Once | INTRAVENOUS | Status: AC
  Administered 2018-02-08: 500 [IU]
  Filled 2018-02-08: qty 5

## 2018-02-08 MED ORDER — FAMOTIDINE 20 MG PO TABS
20.0000 mg | ORAL_TABLET | Freq: Once | ORAL | Status: AC
  Administered 2018-02-08: 20 mg via ORAL

## 2018-02-08 NOTE — Assessment & Plan Note (Addendum)
She has intermittent hemorrhoidal bleeding once a month; it is minor I recommend conservative management with stool softeners Platelet count is stable well over 100,000 and I do not think this contributed to her bleeding

## 2018-02-08 NOTE — Assessment & Plan Note (Signed)
The patient has recurrent ITP, with poor tolerance to steroid treatment, subsequently had near complete response to IVIG For now, the plan would be monthly IVIG Her platelet count looks a little reduced but overall stable I plan to see her back again in the month for further treatment

## 2018-02-08 NOTE — Patient Instructions (Signed)

## 2018-02-08 NOTE — Progress Notes (Signed)
[name]  Adrienne Ada, MD  ASSESSMENT & PLAN:  Idiopathic thrombocytopenic purpura ([name]) The patient has recurrent ITP, with poor tolerance to steroid treatment, subsequently had near complete response to IVIG For now, the plan would be monthly IVIG Her platelet count looks a little reduced but overall stable I plan to see her back again in the month for further treatment   Bleeding hemorrhoid She has intermittent hemorrhoidal bleeding once a month; it is minor I recommend conservative management with stool softeners Platelet count is stable well over 100,000 and I do not think this contributed to her bleeding  Hypertrophic obstructive cardiomyopathy ([name]) Examination today is satisfactory without any signs of congestive heart failure I would defer to her cardiologist for further management   No orders of the defined types were placed in this encounter.   INTERVAL HISTORY: [name] 84 y.o. female returns for further follow-up. She tolerated recent IVIG treatment well without serum sickness She has occasional bleeding hemorrhoid but not severe Denies recent cough, chest pain or shortness of breath or signs or symptoms of congestive heart failure She denies recent infection  SUMMARY OF HEMATOLOGIC HISTORY:  This is a pleasant lady with chronic thrombocytopenia. The patient was discovered to have low platelet count when she was placed on Plavix for coronary artery disease. 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 She is being observed. On 01/04/2016, she was started on prednisone therapy for ITP when her platelet count dip under  50,000 May 2017, she tolerated prednisone poorly and prednisone is slowly tapered off 05/05/2017, she had relapsed ITP and started  on IVIG with complete response to Rx but with platelet count trending down after 4 weeks. She is then scheduled for IVIG monthly  I have reviewed the past medical history, past surgical history, social history and family history with the patient and they are unchanged from previous [name].  ALLERGIES:  is allergic to bee venom; iodides; lyrica [pregabalin]; codeine; demerol [meperidine]; ambien [zolpidem tartrate]; aspirin; celebrex [celecoxib]; darvocet [propoxyphene n-acetaminophen]; fosamax [alendronate sodium]; lipitor [atorvastatin]; morphine and related; valium [diazepam]; and iodine.  MEDICATIONS:  Current Outpatient Medications  Medication Sig Dispense Refill  . amLODipine (NORVASC) 5 MG tablet Take 1 tablet (5 mg total) by mouth daily. 90 tablet 3  . calcium citrate (CALCITRATE - DOSED IN MG ELEMENTAL CALCIUM) 950 MG tablet Take 1 tablet by mouth daily.    . cetirizine (ZYRTEC) 10 MG tablet Take 10 mg by mouth daily.    . Cholecalciferol (VITAMIN D-3 PO) Take 1 tablet by mouth daily.    Marland Kitchen EPINEPHrine (ADRENALIN) 1 MG/ML injection Inject 1 mg as directed as needed for anaphylaxis. Reported on 02/01/2016    . ezetimibe (ZETIA) 10 MG tablet Take 1 tablet (10 mg total) by mouth daily. 30 tablet 6  . lidocaine-prilocaine (EMLA) cream Apply 1 application topically as needed. 30 g 6  . Lysine 500 MG CAPS Take 1 capsule by mouth 2 (two) times daily as needed (fever blisters).     . meclizine (ANTIVERT) 25 MG tablet Take 25 mg by mouth daily as needed for dizziness.     . Multiple Vitamins-Minerals (CENTRUM SILVER 50+WOMEN PO) Take 1 tablet by mouth daily.    . nitroGLYCERIN (NITROSTAT) 0.4 MG SL tablet Place 0.4 mg under the tongue every 5 (five) minutes  as needed for chest pain. Reported on 02/01/2016    . Omega 3 1000 MG CAPS Take 1 each by mouth daily.     Marland Kitchen omeprazole (PRILOSEC) 40 MG capsule Take 40 mg by mouth daily.    . Prenatal Vit-Fe Fumarate-FA (PRENATAL VITAMIN PO) Take 1 tablet by  mouth 2 (two) times daily.    . psyllium (METAMUCIL) 58.6 % powder Take 1 packet by mouth daily.    . rosuvastatin (CRESTOR) 10 MG tablet Take 10 mg by mouth daily.  3  . sotalol (BETAPACE) 160 MG tablet TAKE 1 TABLET (160 MG TOTAL) BY MOUTH 2 (TWO) TIMES DAILY. 60 tablet 10   No current facility-administered medications for this visit.    Facility-Administered Medications Ordered in Other Visits  Medication Dose Route Frequency Provider Last Rate Last Dose  . sodium chloride flush (NS) 0.9 % injection 10 mL  10 mL Intravenous PRN Alvy Bimler, Raunel Dimartino, MD   10 mL at 09/22/17 1322     REVIEW OF SYSTEMS:   Constitutional: Denies fevers, chills or night sweats Eyes: Denies blurriness of vision Ears, nose, mouth, throat, and face: Denies mucositis or sore throat Respiratory: Denies cough, dyspnea or wheezes Cardiovascular: Denies palpitation, chest discomfort or lower extremity swelling Gastrointestinal:  Denies nausea, heartburn or change in bowel habits Skin: Denies abnormal skin rashes Lymphatics: Denies new lymphadenopathy or easy bruising Neurological:Denies numbness, tingling or new weaknesses Behavioral/Psych: Mood is stable, no new changes  All other systems were reviewed with the patient and are negative.  PHYSICAL EXAMINATION: ECOG PERFORMANCE STATUS: 1 - Symptomatic but completely ambulatory  Vitals:   02/08/18 0837  BP: (!) 126/56  Pulse: (!) 51  Resp: 18  Temp: 97.8 F (36.6 C)  SpO2: 99%   Filed Weights   02/08/18 0837  Weight: 137 lb 6.4 oz (62.3 kg)    GENERAL:alert, no distress and comfortable SKIN: skin color, texture, turgor are normal, no rashes or significant lesions EYES: normal, Conjunctiva are pink and non-injected, sclera clear OROPHARYNX:no exudate, no erythema and lips, buccal mucosa, and tongue normal  NECK: supple, thyroid normal size, non-tender, without nodularity LYMPH:  no palpable lymphadenopathy in the cervical, axillary or inguinal LUNGS: clear  to auscultation and percussion with normal breathing effort HEART: regular rate & rhythm with soft left-sided murmurs and no lower extremity edema ABDOMEN:abdomen soft, non-tender and normal bowel sounds Musculoskeletal:no cyanosis of digits and no clubbing  NEURO: alert & oriented x 3 with fluent speech, no focal motor/sensory deficits  LABORATORY DATA:  I have reviewed the data as listed     Component Value Date/Time   NA 137 07/11/2017 0933   NA 142 05/05/2017 0850   K 4.3 07/11/2017 0933   K 4.0 05/05/2017 0850   CL 104 07/11/2017 0933   CL 104 08/02/2012 1515   CO2 24 07/11/2017 0933   CO2 25 05/05/2017 0850   GLUCOSE 110 (H) 07/11/2017 0933   GLUCOSE 130 05/05/2017 0850   GLUCOSE 105 (H) 08/02/2012 1515   BUN 17 07/11/2017 0933   BUN 18.9 05/05/2017 0850   CREATININE 0.97 07/11/2017 0933   CREATININE 0.9 05/05/2017 0850   CALCIUM 9.7 07/11/2017 0933   CALCIUM 10.0 05/05/2017 0850   PROT 6.9 05/05/2017 0850   ALBUMIN 3.8 05/05/2017 0850   AST 19 05/05/2017 0850   ALT 14 05/05/2017 0850   ALKPHOS 54 05/05/2017 0850   BILITOT 0.48 05/05/2017 0850   GFRNONAA 53 (L) 07/11/2017 0933   GFRAA >60 07/11/2017 9390  No results found for: SPEP, UPEP  Lab Results  Component Value Date   WBC 5.2 02/08/2018   NEUTROABS 2.2 02/08/2018   HGB 11.8 02/08/2018   HCT 36.3 02/08/2018   MCV 87.7 02/08/2018   PLT 111 (L) 02/08/2018      Chemistry      Component Value Date/Time   NA 137 07/11/2017 0933   NA 142 05/05/2017 0850   K 4.3 07/11/2017 0933   K 4.0 05/05/2017 0850   CL 104 07/11/2017 0933   CL 104 08/02/2012 1515   CO2 24 07/11/2017 0933   CO2 25 05/05/2017 0850   BUN 17 07/11/2017 0933   BUN 18.9 05/05/2017 0850   CREATININE 0.97 07/11/2017 0933   CREATININE 0.9 05/05/2017 0850      Component Value Date/Time   CALCIUM 9.7 07/11/2017 0933   CALCIUM 10.0 05/05/2017 0850   ALKPHOS 54 05/05/2017 0850   AST 19 05/05/2017 0850   ALT 14 05/05/2017 0850    BILITOT 0.48 05/05/2017 0850       I spent 15 minutes counseling the patient face to face. The total time spent in the appointment was 20 minutes and more than 50% was on counseling.   All questions were answered. The patient knows to call the clinic with any problems, questions or concerns. No barriers to learning was detected.    Heath Lark, MD 5/23/20191:12 PM

## 2018-02-08 NOTE — Telephone Encounter (Signed)
Gave avs and calendar ° °

## 2018-02-08 NOTE — Patient Instructions (Signed)

## 2018-02-08 NOTE — Assessment & Plan Note (Signed)
Examination today is satisfactory without any signs of congestive heart failure I would defer to her cardiologist for further management 

## 2018-02-08 NOTE — Telephone Encounter (Signed)
Per 5/23 no care plan

## 2018-03-09 ENCOUNTER — Inpatient Hospital Stay: Payer: Medicare HMO

## 2018-03-09 ENCOUNTER — Encounter: Payer: Self-pay | Admitting: Hematology and Oncology

## 2018-03-09 ENCOUNTER — Inpatient Hospital Stay: Payer: Medicare HMO | Attending: Hematology and Oncology

## 2018-03-09 ENCOUNTER — Inpatient Hospital Stay (HOSPITAL_BASED_OUTPATIENT_CLINIC_OR_DEPARTMENT_OTHER): Payer: Medicare HMO | Admitting: Hematology and Oncology

## 2018-03-09 VITALS — BP 157/63 | HR 45 | Temp 97.5°F | Resp 17

## 2018-03-09 DIAGNOSIS — Z79899 Other long term (current) drug therapy: Secondary | ICD-10-CM

## 2018-03-09 DIAGNOSIS — K649 Unspecified hemorrhoids: Secondary | ICD-10-CM | POA: Insufficient documentation

## 2018-03-09 DIAGNOSIS — D693 Immune thrombocytopenic purpura: Secondary | ICD-10-CM

## 2018-03-09 DIAGNOSIS — Z95828 Presence of other vascular implants and grafts: Secondary | ICD-10-CM

## 2018-03-09 DIAGNOSIS — I421 Obstructive hypertrophic cardiomyopathy: Secondary | ICD-10-CM | POA: Insufficient documentation

## 2018-03-09 LAB — CBC WITH DIFFERENTIAL/PLATELET
BASOS PCT: 0 %
Basophils Absolute: 0 10*3/uL (ref 0.0–0.1)
EOS ABS: 0.1 10*3/uL (ref 0.0–0.5)
Eosinophils Relative: 2 %
HEMATOCRIT: 35.3 % (ref 34.8–46.6)
Hemoglobin: 11.8 g/dL (ref 11.6–15.9)
LYMPHS ABS: 2.3 10*3/uL (ref 0.9–3.3)
Lymphocytes Relative: 43 %
MCH: 29.1 pg (ref 25.1–34.0)
MCHC: 33.4 g/dL (ref 31.5–36.0)
MCV: 86.9 fL (ref 79.5–101.0)
MONOS PCT: 10 %
Monocytes Absolute: 0.5 10*3/uL (ref 0.1–0.9)
NEUTROS ABS: 2.5 10*3/uL (ref 1.5–6.5)
NEUTROS PCT: 45 %
Platelets: 94 10*3/uL — ABNORMAL LOW (ref 145–400)
RBC: 4.06 MIL/uL (ref 3.70–5.45)
RDW: 14.4 % (ref 11.2–14.5)
WBC: 5.5 10*3/uL (ref 3.9–10.3)

## 2018-03-09 MED ORDER — ACETAMINOPHEN 325 MG PO TABS
650.0000 mg | ORAL_TABLET | Freq: Once | ORAL | Status: AC
  Administered 2018-03-09: 650 mg via ORAL

## 2018-03-09 MED ORDER — SODIUM CHLORIDE 0.9% FLUSH
10.0000 mL | Freq: Once | INTRAVENOUS | Status: AC
  Administered 2018-03-09: 10 mL
  Filled 2018-03-09: qty 10

## 2018-03-09 MED ORDER — DIPHENHYDRAMINE HCL 25 MG PO CAPS
ORAL_CAPSULE | ORAL | Status: AC
  Filled 2018-03-09: qty 1

## 2018-03-09 MED ORDER — FAMOTIDINE 20 MG PO TABS
ORAL_TABLET | ORAL | Status: AC
  Filled 2018-03-09: qty 1

## 2018-03-09 MED ORDER — IMMUNE GLOBULIN (HUMAN) 20 GM/200ML IV SOLN
55.0000 g | Freq: Once | INTRAVENOUS | Status: AC
  Administered 2018-03-09: 55 g via INTRAVENOUS
  Filled 2018-03-09: qty 400

## 2018-03-09 MED ORDER — DIPHENHYDRAMINE HCL 25 MG PO TABS
25.0000 mg | ORAL_TABLET | Freq: Once | ORAL | Status: AC
  Administered 2018-03-09: 25 mg via ORAL
  Filled 2018-03-09: qty 1

## 2018-03-09 MED ORDER — FAMOTIDINE 20 MG PO TABS
20.0000 mg | ORAL_TABLET | Freq: Once | ORAL | Status: AC
  Administered 2018-03-09: 20 mg via ORAL

## 2018-03-09 MED ORDER — HEPARIN SOD (PORK) LOCK FLUSH 100 UNIT/ML IV SOLN
500.0000 [IU] | Freq: Once | INTRAVENOUS | Status: AC
  Administered 2018-03-09: 500 [IU]
  Filled 2018-03-09: qty 5

## 2018-03-09 MED ORDER — ACETAMINOPHEN 325 MG PO TABS
ORAL_TABLET | ORAL | Status: AC
  Filled 2018-03-09: qty 2

## 2018-03-09 NOTE — Assessment & Plan Note (Signed)
Examination today is satisfactory without any signs of congestive heart failure I would defer to her cardiologist for further management

## 2018-03-09 NOTE — Assessment & Plan Note (Addendum)
The patient has recurrent ITP, with poor tolerance to steroid treatment, subsequently had near complete response to IVIG For now, the plan would be monthly IVIG Her platelet count looks a little reduced but overall stable I plan to see her back again next month for further treatment We discussed the potential of lengthening interval between treatment

## 2018-03-09 NOTE — Assessment & Plan Note (Signed)
She continues to have intermittent bleeding hemorrhoid We discussed potential referral to GI or intermittent use of Preparation H

## 2018-03-09 NOTE — Patient Instructions (Signed)

## 2018-03-09 NOTE — Progress Notes (Signed)
[place] OFFICE PROGRESS NOTE  Adrienne Ada, MD  ASSESSMENT & PLAN:  Idiopathic thrombocytopenic purpura ([place]) The patient has recurrent ITP, with poor tolerance to steroid treatment, subsequently had near complete response to IVIG For now, the plan would be monthly IVIG Her platelet count looks a little reduced but overall stable I plan to see her back again next month for further treatment We discussed the potential of lengthening interval between treatment  Bleeding hemorrhoid She continues to have intermittent bleeding hemorrhoid We discussed potential referral to GI or intermittent use of Preparation H  Hypertrophic obstructive cardiomyopathy ([place]) Examination today is satisfactory without any signs of congestive heart failure I would defer to her cardiologist for further management   No orders of the defined types were placed in this encounter.   INTERVAL HISTORY: [name] 84 y.o. female returns for further management of chronic ITP with IVIG She denies infusion reaction with the last treatment She continues to have intermittent bleeding hemorrhoids of not significant severity She had at least 3-4 episodes in the past month Her husband has given her some Preparation H as needed The patient denies any recent signs or symptoms of bleeding such as spontaneous epistaxis, hematuria or hemoptysis. She has no recent signs or symptoms of congestive heart failure after treatment  SUMMARY OF HEMATOLOGIC HISTORY:  This is a pleasant lady with chronic thrombocytopenia. The patient was discovered to have low platelet count when she was placed on Plavix for coronary artery disease. 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 She is being observed. On 01/04/2016, she was started on  prednisone therapy for ITP when her platelet count dip under  50,000 May 2017, she tolerated prednisone poorly and prednisone is slowly tapered off 05/05/2017, she had relapsed ITP and started on IVIG with complete response to Rx but with platelet count trending down after 4 weeks. She is then scheduled for IVIG monthly  I have reviewed the past medical history, past surgical history, social history and family history with the patient and they are unchanged from previous note.  ALLERGIES:  is allergic to bee venom; iodides; lyrica [pregabalin]; codeine; demerol [meperidine]; ambien [zolpidem tartrate]; aspirin; celebrex [celecoxib]; darvocet [propoxyphene n-acetaminophen]; fosamax [alendronate sodium]; lipitor [atorvastatin]; morphine and related; valium [diazepam]; and iodine.  MEDICATIONS:  Current Outpatient Medications  Medication Sig Dispense Refill  . amLODipine (NORVASC) 5 MG tablet Take 1 tablet (5 mg total) by mouth daily. 90 tablet 3  . calcium citrate (CALCITRATE - DOSED IN MG ELEMENTAL CALCIUM) 950 MG tablet Take 1 tablet by mouth daily.    . cetirizine (ZYRTEC) 10 MG tablet Take 10 mg by mouth daily.    . Cholecalciferol (VITAMIN D-3 PO) Take 1 tablet by mouth daily.    Marland Kitchen EPINEPHrine (ADRENALIN) 1 MG/ML injection Inject 1 mg as directed as needed for anaphylaxis. Reported on 02/01/2016    . ezetimibe (ZETIA) 10 MG tablet Take 1 tablet (10 mg total) by mouth daily. 30 tablet 6  . lidocaine-prilocaine (EMLA) cream Apply 1 application topically as needed. 30 g 6  . Lysine 500 MG CAPS Take 1 capsule by mouth 2 (two) times daily as needed (fever blisters).     . meclizine (ANTIVERT) 25 MG tablet Take 25 mg by mouth daily as needed for dizziness.     . Multiple Vitamins-Minerals (CENTRUM SILVER 50+WOMEN  PO) Take 1 tablet by mouth daily.    . nitroGLYCERIN (NITROSTAT) 0.4 MG SL tablet Place 0.4 mg under the tongue every 5 (five) minutes as needed for chest pain. Reported on 02/01/2016    .  Omega 3 1000 MG CAPS Take 1 each by mouth daily.     Marland Kitchen omeprazole (PRILOSEC) 40 MG capsule Take 40 mg by mouth daily.    . Prenatal Vit-Fe Fumarate-FA (PRENATAL VITAMIN PO) Take 1 tablet by mouth 2 (two) times daily.    . psyllium (METAMUCIL) 58.6 % powder Take 1 packet by mouth daily.    . rosuvastatin (CRESTOR) 10 MG tablet Take 10 mg by mouth daily.  3  . sotalol (BETAPACE) 160 MG tablet TAKE 1 TABLET (160 MG TOTAL) BY MOUTH 2 (TWO) TIMES DAILY. 60 tablet 10   No current facility-administered medications for this visit.    Facility-Administered Medications Ordered in Other Visits  Medication Dose Route Frequency Provider Last Rate Last Dose  . sodium chloride flush (NS) 0.9 % injection 10 mL  10 mL Intravenous PRN Alvy Bimler, Atisha Hamidi, MD   10 mL at 09/22/17 1322     REVIEW OF SYSTEMS:   Constitutional: Denies fevers, chills or night sweats Eyes: Denies blurriness of vision Ears, nose, mouth, throat, and face: Denies mucositis or sore throat Respiratory: Denies cough, dyspnea or wheezes Cardiovascular: Denies palpitation, chest discomfort or lower extremity swelling Gastrointestinal:  Denies nausea, heartburn or change in bowel habits Skin: Denies abnormal skin rashes Lymphatics: Denies new lymphadenopathy or easy bruising Neurological:Denies numbness, tingling or new weaknesses Behavioral/Psych: Mood is stable, no new changes  All other systems were reviewed with the patient and are negative.  PHYSICAL EXAMINATION: ECOG PERFORMANCE STATUS: 1 - Symptomatic but completely ambulatory  Vitals:   03/09/18 0932  BP: (!) 138/53  Pulse: (!) 46  Resp: 17  Temp: 98.3 F (36.8 C)  SpO2: 99%   Filed Weights   03/09/18 0932  Weight: 137 lb 3.2 oz (62.2 kg)    GENERAL:alert, no distress and comfortable SKIN: skin color, texture, turgor are normal, no rashes or significant lesions EYES: normal, Conjunctiva are pink and non-injected, sclera clear OROPHARYNX:no exudate, no erythema and lips,  buccal mucosa, and tongue normal  NECK: supple, thyroid normal size, non-tender, without nodularity LYMPH:  no palpable lymphadenopathy in the cervical, axillary or inguinal LUNGS: clear to auscultation and percussion with normal breathing effort HEART: regular rate & rhythm, soft systolic murmur and no lower extremity edema ABDOMEN:abdomen soft, non-tender and normal bowel sounds Musculoskeletal:no cyanosis of digits and no clubbing  NEURO: alert & oriented x 3 with fluent speech, no focal motor/sensory deficits  LABORATORY DATA:  I have reviewed the data as listed     Component Value Date/Time   NA 137 07/11/2017 0933   NA 142 05/05/2017 0850   K 4.3 07/11/2017 0933   K 4.0 05/05/2017 0850   CL 104 07/11/2017 0933   CL 104 08/02/2012 1515   CO2 24 07/11/2017 0933   CO2 25 05/05/2017 0850   GLUCOSE 110 (H) 07/11/2017 0933   GLUCOSE 130 05/05/2017 0850   GLUCOSE 105 (H) 08/02/2012 1515   BUN 17 07/11/2017 0933   BUN 18.9 05/05/2017 0850   CREATININE 0.97 07/11/2017 0933   CREATININE 0.9 05/05/2017 0850   CALCIUM 9.7 07/11/2017 0933   CALCIUM 10.0 05/05/2017 0850   PROT 6.9 05/05/2017 0850   ALBUMIN 3.8 05/05/2017 0850   AST 19 05/05/2017 0850   ALT 14 05/05/2017 0850  ALKPHOS 54 05/05/2017 0850   BILITOT 0.48 05/05/2017 0850   GFRNONAA 53 (L) 07/11/2017 0933   GFRAA >60 07/11/2017 0933    No results found for: SPEP, UPEP  Lab Results  Component Value Date   WBC 5.5 03/09/2018   NEUTROABS 2.5 03/09/2018   HGB 11.8 03/09/2018   HCT 35.3 03/09/2018   MCV 86.9 03/09/2018   PLT 94 (L) 03/09/2018      Chemistry      Component Value Date/Time   NA 137 07/11/2017 0933   NA 142 05/05/2017 0850   K 4.3 07/11/2017 0933   K 4.0 05/05/2017 0850   CL 104 07/11/2017 0933   CL 104 08/02/2012 1515   CO2 24 07/11/2017 0933   CO2 25 05/05/2017 0850   BUN 17 07/11/2017 0933   BUN 18.9 05/05/2017 0850   CREATININE 0.97 07/11/2017 0933   CREATININE 0.9 05/05/2017 0850       Component Value Date/Time   CALCIUM 9.7 07/11/2017 0933   CALCIUM 10.0 05/05/2017 0850   ALKPHOS 54 05/05/2017 0850   AST 19 05/05/2017 0850   ALT 14 05/05/2017 0850   BILITOT 0.48 05/05/2017 0850       I spent 15 minutes counseling the patient face to face. The total time spent in the appointment was 20 minutes and more than 50% was on counseling.   All questions were answered. The patient knows to call the clinic with any problems, questions or concerns. No barriers to learning was detected.    Heath Lark, MD 6/21/201910:30 AM

## 2018-03-12 ENCOUNTER — Telehealth: Payer: Self-pay | Admitting: Hematology and Oncology

## 2018-03-12 ENCOUNTER — Inpatient Hospital Stay: Payer: Medicare HMO | Admitting: Hematology and Oncology

## 2018-03-12 ENCOUNTER — Telehealth: Payer: Self-pay | Admitting: *Deleted

## 2018-03-12 DIAGNOSIS — L509 Urticaria, unspecified: Secondary | ICD-10-CM

## 2018-03-12 NOTE — Telephone Encounter (Signed)
Received fax from [facility]. Date 03/10/18.  Husband states patient had privagen on Friday. One Saturday night had "welts like mosquito bites- one in palm of hand, 2-3 on belt line, one on inside of knee and one on her breast. Was outside this afternoon. Privagen was for low platelet count. This is the 11th or 12th infusion."   RN attempted to call patient to check on her. Left message to call Dr Calton Dach nurse.

## 2018-03-12 NOTE — Telephone Encounter (Signed)
Spoke with patient and she does want to be seen. Will be here ~ 1:15. Message to scheduler

## 2018-03-12 NOTE — Telephone Encounter (Signed)
They believe it was the IVIG that caused the welts. Spots are now "just little red dots" except the one on her breast- dose not want to take a picture of it.  Will talk to her husband and call back regarding being seen.

## 2018-03-12 NOTE — Telephone Encounter (Signed)
No 6/24 los, orders, referrals.

## 2018-03-12 NOTE — Telephone Encounter (Signed)
1) Does husband think this is related to IVIG or insect bites? 2) Take pictures 3) Is he too concerned wanting to bring her in for eval today? If so I can see her before 130 pm

## 2018-03-13 ENCOUNTER — Encounter: Payer: Self-pay | Admitting: Hematology and Oncology

## 2018-03-13 DIAGNOSIS — L509 Urticaria, unspecified: Secondary | ICD-10-CM | POA: Insufficient documentation

## 2018-03-13 NOTE — Assessment & Plan Note (Signed)
The patient has developed some hives soon after IVIG treatment The appearance of her skin rash today is much improved compared to what was described to me I recommend Benadryl as needed It is possible that this could be a reaction due to IVIG treatment However, it does not mean the patient should stop IVIG treatment We also discussed additional premedication in the future or switching her to a different brand After much discussion, her husband would like to manage this conservatively for now

## 2018-03-13 NOTE — Progress Notes (Signed)
[place] OFFICE PROGRESS NOTE  [name], MD  ASSESSMENT & PLAN:  Hives of unknown origin The patient has developed some hives soon after IVIG treatment The appearance of her skin rash today is much improved compared to what was described to me I recommend Benadryl as needed It is possible that this could be a reaction due to IVIG treatment However, it does not mean the patient should stop IVIG treatment We also discussed additional premedication in the future or switching her to a different brand After much discussion, her husband would like to manage this conservatively for now    No orders of the defined types were placed in this encounter.   INTERVAL HISTORY: [name] 84 y.o. female returns for urgent evaluation due to hives/welts Soon after her IVIG treatment, the following day on Saturday afternoon, she developed hives/welts on her right hand and over the medial side of her chest wall close to the left nipple It was described as a large area on both places It was mildly itchy Her husband applies some cooling cream over those areas and the patient also took some Benadryl Her hives are getting better but due to concern that this could be a reaction, she is seen today for further evaluation She denies recent fever or chills No symptoms of serum sickness  SUMMARY OF HEMATOLOGIC HISTORY:  This is a pleasant lady with chronic thrombocytopenia. The patient was discovered to have low platelet count when she was placed on Plavix for coronary artery disease. 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 She is being observed. On 01/04/2016, she was started on prednisone therapy for ITP when her platelet count dip under  50,000 May 2017, she tolerated prednisone poorly and prednisone is  slowly tapered off 05/05/2017, she had relapsed ITP and started on IVIG with complete response to Rx but with platelet count trending down after 4 weeks. She is then scheduled for IVIG monthly  I have reviewed the past medical history, past surgical history, social history and family history with the patient and they are unchanged from previous note.  ALLERGIES:  is allergic to bee venom; iodides; lyrica [pregabalin]; codeine; demerol [meperidine]; ambien [zolpidem tartrate]; aspirin; celebrex [celecoxib]; darvocet [propoxyphene n-acetaminophen]; fosamax [alendronate sodium]; lipitor [atorvastatin]; morphine and related; valium [diazepam]; and iodine.  MEDICATIONS:  Current Outpatient Medications  Medication Sig Dispense Refill  . amLODipine (NORVASC) 5 MG tablet Take 1 tablet (5 mg total) by mouth daily. 90 tablet 3  . calcium citrate (CALCITRATE - DOSED IN MG ELEMENTAL CALCIUM) 950 MG tablet Take 1 tablet by mouth daily.    . cetirizine (ZYRTEC) 10 MG tablet Take 10 mg by mouth daily.    . Cholecalciferol (VITAMIN D-3 PO) Take 1 tablet by mouth daily.    Marland Kitchen EPINEPHrine (ADRENALIN) 1 MG/ML injection Inject 1 mg as directed as needed for anaphylaxis. Reported on 02/01/2016    . ezetimibe (ZETIA) 10 MG tablet Take 1 tablet (10 mg total) by mouth daily. 30 tablet 6  . lidocaine-prilocaine (EMLA) cream Apply 1 application topically as needed. 30 g 6  . Lysine 500 MG CAPS Take 1 capsule by mouth 2 (two) times daily as needed (fever blisters).     . meclizine (ANTIVERT) 25 MG tablet Take 25 mg by mouth daily as needed for dizziness.     Marland Kitchen  Multiple Vitamins-Minerals (CENTRUM SILVER 50+WOMEN PO) Take 1 tablet by mouth daily.    . nitroGLYCERIN (NITROSTAT) 0.4 MG SL tablet Place 0.4 mg under the tongue every 5 (five) minutes as needed for chest pain. Reported on 02/01/2016    . Omega 3 1000 MG CAPS Take 1 each by mouth daily.     Marland Kitchen omeprazole (PRILOSEC) 40 MG capsule Take 40 mg by mouth daily.    .  Prenatal Vit-Fe Fumarate-FA (PRENATAL VITAMIN PO) Take 1 tablet by mouth 2 (two) times daily.    . psyllium (METAMUCIL) 58.6 % powder Take 1 packet by mouth daily.    . rosuvastatin (CRESTOR) 10 MG tablet Take 10 mg by mouth daily.  3  . sotalol (BETAPACE) 160 MG tablet TAKE 1 TABLET (160 MG TOTAL) BY MOUTH 2 (TWO) TIMES DAILY. 60 tablet 10   No current facility-administered medications for this visit.    Facility-Administered Medications Ordered in Other Visits  Medication Dose Route Frequency Provider Last Rate Last Dose  . sodium chloride flush (NS) 0.9 % injection 10 mL  10 mL Intravenous PRN Alvy Bimler, Delray Reza, MD   10 mL at 09/22/17 1322     REVIEW OF SYSTEMS:   Constitutional: Denies fevers, chills or night sweats Eyes: Denies blurriness of vision Ears, nose, mouth, throat, and face: Denies mucositis or sore throat Respiratory: Denies cough, dyspnea or wheezes Cardiovascular: Denies palpitation, chest discomfort or lower extremity swelling Gastrointestinal:  Denies nausea, heartburn or change in bowel habits Lymphatics: Denies new lymphadenopathy or easy bruising Neurological:Denies numbness, tingling or new weaknesses Behavioral/Psych: Mood is stable, no new changes  All other systems were reviewed with the patient and are negative.  PHYSICAL EXAMINATION: ECOG PERFORMANCE STATUS: 0 - Asymptomatic  Vitals:   03/12/18 1330  BP: (!) 154/64  Pulse: (!) 51  Resp: 18  Temp: 97.9 F (36.6 C)  SpO2: 100%   Filed Weights   03/12/18 1330  Weight: 137 lb 1.6 oz (62.2 kg)    GENERAL:alert, no distress and comfortable SKIN: Noted small areas of papular rash on the palm of her right hand and on the medial aspect of the left nipple NEURO: alert & oriented x 3 with fluent speech, no focal motor/sensory deficits  LABORATORY DATA:  I have reviewed the data as listed     Component Value Date/Time   NA 137 07/11/2017 0933   NA 142 05/05/2017 0850   K 4.3 07/11/2017 0933   K 4.0  05/05/2017 0850   CL 104 07/11/2017 0933   CL 104 08/02/2012 1515   CO2 24 07/11/2017 0933   CO2 25 05/05/2017 0850   GLUCOSE 110 (H) 07/11/2017 0933   GLUCOSE 130 05/05/2017 0850   GLUCOSE 105 (H) 08/02/2012 1515   BUN 17 07/11/2017 0933   BUN 18.9 05/05/2017 0850   CREATININE 0.97 07/11/2017 0933   CREATININE 0.9 05/05/2017 0850   CALCIUM 9.7 07/11/2017 0933   CALCIUM 10.0 05/05/2017 0850   PROT 6.9 05/05/2017 0850   ALBUMIN 3.8 05/05/2017 0850   AST 19 05/05/2017 0850   ALT 14 05/05/2017 0850   ALKPHOS 54 05/05/2017 0850   BILITOT 0.48 05/05/2017 0850   GFRNONAA 53 (L) 07/11/2017 0933   GFRAA >60 07/11/2017 0933    No results found for: SPEP, UPEP  Lab Results  Component Value Date   WBC 5.5 03/09/2018   NEUTROABS 2.5 03/09/2018   HGB 11.8 03/09/2018   HCT 35.3 03/09/2018   MCV 86.9 03/09/2018   PLT 94 (  L) 03/09/2018      Chemistry      Component Value Date/Time   NA 137 07/11/2017 0933   NA 142 05/05/2017 0850   K 4.3 07/11/2017 0933   K 4.0 05/05/2017 0850   CL 104 07/11/2017 0933   CL 104 08/02/2012 1515   CO2 24 07/11/2017 0933   CO2 25 05/05/2017 0850   BUN 17 07/11/2017 0933   BUN 18.9 05/05/2017 0850   CREATININE 0.97 07/11/2017 0933   CREATININE 0.9 05/05/2017 0850      Component Value Date/Time   CALCIUM 9.7 07/11/2017 0933   CALCIUM 10.0 05/05/2017 0850   ALKPHOS 54 05/05/2017 0850   AST 19 05/05/2017 0850   ALT 14 05/05/2017 0850   BILITOT 0.48 05/05/2017 0850      I spent 5 minutes counseling the patient face to face. The total time spent in the appointment was 10 minutes and more than 50% was on counseling.   All questions were answered. The patient knows to call the clinic with any problems, questions or concerns. No barriers to learning was detected.    Heath Lark, MD 6/25/20198:21 AM

## 2018-04-06 ENCOUNTER — Inpatient Hospital Stay: Payer: Medicare HMO

## 2018-04-06 ENCOUNTER — Inpatient Hospital Stay (HOSPITAL_BASED_OUTPATIENT_CLINIC_OR_DEPARTMENT_OTHER): Payer: Medicare HMO | Admitting: Hematology and Oncology

## 2018-04-06 ENCOUNTER — Telehealth: Payer: Self-pay | Admitting: Hematology and Oncology

## 2018-04-06 ENCOUNTER — Encounter: Payer: Self-pay | Admitting: Hematology and Oncology

## 2018-04-06 ENCOUNTER — Inpatient Hospital Stay: Payer: Medicare HMO | Attending: Hematology and Oncology

## 2018-04-06 VITALS — BP 134/51 | HR 44 | Temp 97.8°F | Resp 17

## 2018-04-06 DIAGNOSIS — Z79899 Other long term (current) drug therapy: Secondary | ICD-10-CM | POA: Diagnosis not present

## 2018-04-06 DIAGNOSIS — K649 Unspecified hemorrhoids: Secondary | ICD-10-CM | POA: Diagnosis not present

## 2018-04-06 DIAGNOSIS — D693 Immune thrombocytopenic purpura: Secondary | ICD-10-CM | POA: Insufficient documentation

## 2018-04-06 DIAGNOSIS — Z95828 Presence of other vascular implants and grafts: Secondary | ICD-10-CM

## 2018-04-06 DIAGNOSIS — I421 Obstructive hypertrophic cardiomyopathy: Secondary | ICD-10-CM

## 2018-04-06 DIAGNOSIS — L509 Urticaria, unspecified: Secondary | ICD-10-CM

## 2018-04-06 LAB — CBC WITH DIFFERENTIAL/PLATELET
Basophils Absolute: 0 10*3/uL (ref 0.0–0.1)
Basophils Relative: 1 %
EOS PCT: 1 %
Eosinophils Absolute: 0.1 10*3/uL (ref 0.0–0.5)
HEMATOCRIT: 36.3 % (ref 34.8–46.6)
HEMOGLOBIN: 12.3 g/dL (ref 11.6–15.9)
LYMPHS ABS: 2.1 10*3/uL (ref 0.9–3.3)
LYMPHS PCT: 38 %
MCH: 29.1 pg (ref 25.1–34.0)
MCHC: 33.8 g/dL (ref 31.5–36.0)
MCV: 86.1 fL (ref 79.5–101.0)
Monocytes Absolute: 0.6 10*3/uL (ref 0.1–0.9)
Monocytes Relative: 11 %
NEUTROS ABS: 2.7 10*3/uL (ref 1.5–6.5)
Neutrophils Relative %: 49 %
Platelets: 93 10*3/uL — ABNORMAL LOW (ref 145–400)
RBC: 4.22 MIL/uL (ref 3.70–5.45)
RDW: 14.6 % — AB (ref 11.2–14.5)
WBC: 5.5 10*3/uL (ref 3.9–10.3)

## 2018-04-06 MED ORDER — FAMOTIDINE 20 MG PO TABS
ORAL_TABLET | ORAL | Status: AC
  Filled 2018-04-06: qty 1

## 2018-04-06 MED ORDER — DIPHENHYDRAMINE HCL 25 MG PO CAPS
ORAL_CAPSULE | ORAL | Status: AC
  Filled 2018-04-06: qty 1

## 2018-04-06 MED ORDER — SODIUM CHLORIDE 0.9% FLUSH
10.0000 mL | Freq: Once | INTRAVENOUS | Status: AC
  Administered 2018-04-06: 10 mL
  Filled 2018-04-06: qty 10

## 2018-04-06 MED ORDER — ACETAMINOPHEN 325 MG PO TABS
ORAL_TABLET | ORAL | Status: AC
  Filled 2018-04-06: qty 2

## 2018-04-06 MED ORDER — ACETAMINOPHEN 325 MG PO TABS
650.0000 mg | ORAL_TABLET | Freq: Once | ORAL | Status: AC
  Administered 2018-04-06: 650 mg via ORAL

## 2018-04-06 MED ORDER — HEPARIN SOD (PORK) LOCK FLUSH 100 UNIT/ML IV SOLN
500.0000 [IU] | Freq: Once | INTRAVENOUS | Status: AC
  Administered 2018-04-06: 500 [IU]
  Filled 2018-04-06: qty 5

## 2018-04-06 MED ORDER — IMMUNE GLOBULIN (HUMAN) 20 GM/200ML IV SOLN
55.0000 g | Freq: Once | INTRAVENOUS | Status: AC
  Administered 2018-04-06: 55 g via INTRAVENOUS
  Filled 2018-04-06: qty 400

## 2018-04-06 MED ORDER — DIPHENHYDRAMINE HCL 25 MG PO TABS
25.0000 mg | ORAL_TABLET | Freq: Once | ORAL | Status: AC
  Administered 2018-04-06: 25 mg via ORAL
  Filled 2018-04-06: qty 1

## 2018-04-06 MED ORDER — FAMOTIDINE 20 MG PO TABS
20.0000 mg | ORAL_TABLET | Freq: Once | ORAL | Status: AC
  Administered 2018-04-06: 20 mg via ORAL

## 2018-04-06 NOTE — Assessment & Plan Note (Signed)
Examination today is satisfactory without any signs of congestive heart failure I would defer to her cardiologist for further management

## 2018-04-06 NOTE — Progress Notes (Signed)
Patient declined to wait for entire 30 minute observation period. Denies concerns on exit.

## 2018-04-06 NOTE — Assessment & Plan Note (Signed)
She has intermittent bleeding hemorrhoids We discussed potential referral to GI or general surgery to consider local sclerotherapy I explained to the husband that the thrombocytopenia is unlikely the cause of her bleeding

## 2018-04-06 NOTE — Telephone Encounter (Signed)
Gave avs and calendar had to decouple per MD

## 2018-04-06 NOTE — Assessment & Plan Note (Signed)
The patient has recurrent ITP, with poor tolerance to steroid treatment, subsequently had near complete response to IVIG For now, the plan would be monthly IVIG Her platelet count looks a little reduced but overall stable I plan to see her back again next month for further treatment I plan to adjust her treatment cycle to accommodate for her plan for future trip to visit her daughter in [place]

## 2018-04-06 NOTE — Assessment & Plan Note (Signed)
She had hives from IVIG but it is minor Recommend conservative management only

## 2018-04-06 NOTE — Patient Instructions (Signed)

## 2018-04-06 NOTE — Progress Notes (Signed)
[place] NOTE  Adrienne Ada, MD  ASSESSMENT & PLAN:  Idiopathic thrombocytopenic purpura ([place]) The patient has recurrent ITP, with poor tolerance to steroid treatment, subsequently had near complete response to IVIG For now, the plan would be monthly IVIG Her platelet count looks a little reduced but overall stable I plan to see her back again next month for further treatment I plan to adjust her treatment cycle to accommodate for her plan for future trip to visit her daughter in [place] of unknown origin She had hives from IVIG but it is minor Recommend conservative management only  Bleeding hemorrhoid She has intermittent bleeding hemorrhoids We discussed potential referral to GI or general surgery to consider local sclerotherapy I explained to the husband that the thrombocytopenia is unlikely the cause of her bleeding  Hypertrophic obstructive cardiomyopathy ([place]) Examination today is satisfactory without any signs of congestive heart failure I would defer to her cardiologist for further management   No orders of the defined types were placed in this encounter.   INTERVAL HISTORY: [name] 84 y.o. female returns for further follow-up She continues to have intermittent hemorrhoidal bleeding She had 1 or 2 days of hives that resolved with conservative management Denies fever or chills No recent infection She denies recent exacerbation of congestive heart failure  SUMMARY OF HEMATOLOGIC HISTORY:  This is a pleasant lady with chronic thrombocytopenia. The patient was discovered to have low platelet count when she was placed on Plavix for coronary artery disease. 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 She is being observed. On 01/04/2016, she was  started on prednisone therapy for ITP when her platelet count dip under  50,000 May 2017, she tolerated prednisone poorly and prednisone is slowly tapered off 05/05/2017, she had relapsed ITP and started on IVIG with complete response to Rx but with platelet count trending down after 4 weeks. She is then scheduled for IVIG monthly  I have reviewed the past medical history, past surgical history, social history and family history with the patient and they are unchanged from previous note.  ALLERGIES:  is allergic to bee venom; iodides; lyrica [pregabalin]; codeine; demerol [meperidine]; ambien [zolpidem tartrate]; aspirin; celebrex [celecoxib]; darvocet [propoxyphene n-acetaminophen]; fosamax [alendronate sodium]; lipitor [atorvastatin]; morphine and related; valium [diazepam]; and iodine.  MEDICATIONS:  Current Outpatient Medications  Medication Sig Dispense Refill  . amLODipine (NORVASC) 5 MG tablet Take 1 tablet (5 mg total) by mouth daily. 90 tablet 3  . calcium citrate (CALCITRATE - DOSED IN MG ELEMENTAL CALCIUM) 950 MG tablet Take 1 tablet by mouth daily.    . cetirizine (ZYRTEC) 10 MG tablet Take 10 mg by mouth daily.    . Cholecalciferol (VITAMIN D-3 PO) Take 1 tablet by mouth daily.    Marland Kitchen EPINEPHrine (ADRENALIN) 1 MG/ML injection Inject 1 mg as directed as needed for anaphylaxis. Reported on 02/01/2016    . ezetimibe (ZETIA) 10 MG tablet Take 1 tablet (10 mg total) by mouth daily. 30 tablet 6  . lidocaine-prilocaine (EMLA) cream Apply 1 application topically as needed. 30 g 6  . Lysine 500 MG CAPS Take 1 capsule by mouth 2 (two) times daily as needed (fever blisters).     . meclizine (ANTIVERT) 25 MG tablet Take 25 mg by mouth daily as needed for dizziness.     Marland Kitchen  Multiple Vitamins-Minerals (CENTRUM SILVER 50+WOMEN PO) Take 1 tablet by mouth daily.    . nitroGLYCERIN (NITROSTAT) 0.4 MG SL tablet Place 0.4 mg under the tongue every 5 (five) minutes as needed for chest pain. Reported on  02/01/2016    . Omega 3 1000 MG CAPS Take 1 each by mouth daily.     Marland Kitchen omeprazole (PRILOSEC) 40 MG capsule Take 40 mg by mouth daily.    . Prenatal Vit-Fe Fumarate-FA (PRENATAL VITAMIN PO) Take 1 tablet by mouth 2 (two) times daily.    . psyllium (METAMUCIL) 58.6 % powder Take 1 packet by mouth daily.    . rosuvastatin (CRESTOR) 10 MG tablet Take 10 mg by mouth daily.  3  . sotalol (BETAPACE) 160 MG tablet TAKE 1 TABLET (160 MG TOTAL) BY MOUTH 2 (TWO) TIMES DAILY. 60 tablet 10   No current facility-administered medications for this visit.    Facility-Administered Medications Ordered in Other Visits  Medication Dose Route Frequency Provider Last Rate Last Dose  . sodium chloride flush (NS) 0.9 % injection 10 mL  10 mL Intravenous PRN Alvy Bimler, Bertran Zeimet, MD   10 mL at 09/22/17 1322     REVIEW OF SYSTEMS:   Constitutional: Denies fevers, chills or night sweats Eyes: Denies blurriness of vision Ears, nose, mouth, throat, and face: Denies mucositis or sore throat Respiratory: Denies cough, dyspnea or wheezes Cardiovascular: Denies palpitation, chest discomfort or lower extremity swelling Gastrointestinal:  Denies nausea, heartburn or change in bowel habits Lymphatics: Denies new lymphadenopathy or easy bruising Neurological:Denies numbness, tingling or new weaknesses Behavioral/Psych: Mood is stable, no new changes  All other systems were reviewed with the patient and are negative.  PHYSICAL EXAMINATION: ECOG PERFORMANCE STATUS: 1 - Symptomatic but completely ambulatory  Vitals:   04/06/18 0913  BP: (!) 136/56  Pulse: (!) 50  Resp: 18  Temp: 98 F (36.7 C)  SpO2: 99%   Filed Weights   04/06/18 0913  Weight: 137 lb 11.2 oz (62.5 kg)    GENERAL:alert, no distress and comfortable SKIN: skin color, texture, turgor are normal, no rashes or significant lesions EYES: normal, Conjunctiva are pink and non-injected, sclera clear OROPHARYNX:no exudate, no erythema and lips, buccal mucosa, and  tongue normal  NECK: supple, thyroid normal size, non-tender, without nodularity LYMPH:  no palpable lymphadenopathy in the cervical, axillary or inguinal LUNGS: clear to auscultation and percussion with normal breathing effort HEART: regular rate & rhythm with soft systolic murmur and no lower extremity edema ABDOMEN:abdomen soft, non-tender and normal bowel sounds Musculoskeletal:no cyanosis of digits and no clubbing  NEURO: alert & oriented x 3 with fluent speech, no focal motor/sensory deficits  LABORATORY DATA:  I have reviewed the data as listed     Component Value Date/Time   NA 137 07/11/2017 0933   NA 142 05/05/2017 0850   K 4.3 07/11/2017 0933   K 4.0 05/05/2017 0850   CL 104 07/11/2017 0933   CL 104 08/02/2012 1515   CO2 24 07/11/2017 0933   CO2 25 05/05/2017 0850   GLUCOSE 110 (H) 07/11/2017 0933   GLUCOSE 130 05/05/2017 0850   GLUCOSE 105 (H) 08/02/2012 1515   BUN 17 07/11/2017 0933   BUN 18.9 05/05/2017 0850   CREATININE 0.97 07/11/2017 0933   CREATININE 0.9 05/05/2017 0850   CALCIUM 9.7 07/11/2017 0933   CALCIUM 10.0 05/05/2017 0850   PROT 6.9 05/05/2017 0850   ALBUMIN 3.8 05/05/2017 0850   AST 19 05/05/2017 0850   ALT 14 05/05/2017  0850   ALKPHOS 54 05/05/2017 0850   BILITOT 0.48 05/05/2017 0850   GFRNONAA 53 (L) 07/11/2017 0933   GFRAA >60 07/11/2017 0933    No results found for: SPEP, UPEP  Lab Results  Component Value Date   WBC 5.5 04/06/2018   NEUTROABS 2.7 04/06/2018   HGB 12.3 04/06/2018   HCT 36.3 04/06/2018   MCV 86.1 04/06/2018   PLT 93 (L) 04/06/2018      Chemistry      Component Value Date/Time   NA 137 07/11/2017 0933   NA 142 05/05/2017 0850   K 4.3 07/11/2017 0933   K 4.0 05/05/2017 0850   CL 104 07/11/2017 0933   CL 104 08/02/2012 1515   CO2 24 07/11/2017 0933   CO2 25 05/05/2017 0850   BUN 17 07/11/2017 0933   BUN 18.9 05/05/2017 0850   CREATININE 0.97 07/11/2017 0933   CREATININE 0.9 05/05/2017 0850      Component  Value Date/Time   CALCIUM 9.7 07/11/2017 0933   CALCIUM 10.0 05/05/2017 0850   ALKPHOS 54 05/05/2017 0850   AST 19 05/05/2017 0850   ALT 14 05/05/2017 0850   BILITOT 0.48 05/05/2017 0850       I spent 15 minutes counseling the patient face to face. The total time spent in the appointment was 20 minutes and more than 50% was on counseling.   All questions were answered. The patient knows to call the clinic with any problems, questions or concerns. No barriers to learning was detected.    Heath Lark, MD 7/19/20196:20 PM

## 2018-04-06 NOTE — Telephone Encounter (Signed)
Per MD ok to move to 8/7 patients husband was not wanting to come in on two separate days.

## 2018-04-25 ENCOUNTER — Inpatient Hospital Stay: Payer: Medicare HMO

## 2018-04-25 ENCOUNTER — Inpatient Hospital Stay: Payer: Medicare HMO | Attending: Hematology and Oncology

## 2018-04-25 ENCOUNTER — Telehealth: Payer: Self-pay | Admitting: Hematology and Oncology

## 2018-04-25 ENCOUNTER — Inpatient Hospital Stay (HOSPITAL_BASED_OUTPATIENT_CLINIC_OR_DEPARTMENT_OTHER): Payer: Medicare HMO | Admitting: Hematology and Oncology

## 2018-04-25 ENCOUNTER — Encounter: Payer: Self-pay | Admitting: Hematology and Oncology

## 2018-04-25 VITALS — BP 149/65 | HR 47 | Temp 97.9°F | Resp 17

## 2018-04-25 DIAGNOSIS — D693 Immune thrombocytopenic purpura: Secondary | ICD-10-CM | POA: Diagnosis not present

## 2018-04-25 DIAGNOSIS — I421 Obstructive hypertrophic cardiomyopathy: Secondary | ICD-10-CM

## 2018-04-25 DIAGNOSIS — Z95828 Presence of other vascular implants and grafts: Secondary | ICD-10-CM

## 2018-04-25 DIAGNOSIS — K649 Unspecified hemorrhoids: Secondary | ICD-10-CM

## 2018-04-25 LAB — CBC WITH DIFFERENTIAL/PLATELET
BASOS ABS: 0 10*3/uL (ref 0.0–0.1)
BASOS PCT: 0 %
EOS ABS: 0.1 10*3/uL (ref 0.0–0.5)
Eosinophils Relative: 1 %
HEMATOCRIT: 36.4 % (ref 34.8–46.6)
HEMOGLOBIN: 12 g/dL (ref 11.6–15.9)
Lymphocytes Relative: 42 %
Lymphs Abs: 2.2 10*3/uL (ref 0.9–3.3)
MCH: 28.6 pg (ref 25.1–34.0)
MCHC: 33 g/dL (ref 31.5–36.0)
MCV: 86.9 fL (ref 79.5–101.0)
Monocytes Absolute: 0.5 10*3/uL (ref 0.1–0.9)
Monocytes Relative: 9 %
NEUTROS PCT: 48 %
Neutro Abs: 2.5 10*3/uL (ref 1.5–6.5)
Platelets: 121 10*3/uL — ABNORMAL LOW (ref 145–400)
RBC: 4.19 MIL/uL (ref 3.70–5.45)
RDW: 14.4 % (ref 11.2–14.5)
WBC: 5.2 10*3/uL (ref 3.9–10.3)

## 2018-04-25 MED ORDER — IMMUNE GLOBULIN (HUMAN) 20 GM/200ML IV SOLN
55.0000 g | Freq: Once | INTRAVENOUS | Status: AC
  Administered 2018-04-25: 55 g via INTRAVENOUS
  Filled 2018-04-25: qty 400

## 2018-04-25 MED ORDER — FAMOTIDINE 20 MG PO TABS
ORAL_TABLET | ORAL | Status: AC
  Filled 2018-04-25: qty 1

## 2018-04-25 MED ORDER — HEPARIN SOD (PORK) LOCK FLUSH 100 UNIT/ML IV SOLN
500.0000 [IU] | Freq: Once | INTRAVENOUS | Status: AC
  Administered 2018-04-25: 500 [IU]
  Filled 2018-04-25: qty 5

## 2018-04-25 MED ORDER — DIPHENHYDRAMINE HCL 25 MG PO CAPS
ORAL_CAPSULE | ORAL | Status: AC
  Filled 2018-04-25: qty 1

## 2018-04-25 MED ORDER — ACETAMINOPHEN 325 MG PO TABS
ORAL_TABLET | ORAL | Status: AC
  Filled 2018-04-25: qty 2

## 2018-04-25 MED ORDER — DIPHENHYDRAMINE HCL 25 MG PO TABS
25.0000 mg | ORAL_TABLET | Freq: Once | ORAL | Status: AC
  Administered 2018-04-25: 25 mg via ORAL
  Filled 2018-04-25: qty 1

## 2018-04-25 MED ORDER — ACETAMINOPHEN 325 MG PO TABS
650.0000 mg | ORAL_TABLET | Freq: Once | ORAL | Status: AC
  Administered 2018-04-25: 650 mg via ORAL

## 2018-04-25 MED ORDER — SODIUM CHLORIDE 0.9% FLUSH
10.0000 mL | Freq: Once | INTRAVENOUS | Status: AC
  Administered 2018-04-25: 10 mL
  Filled 2018-04-25: qty 10

## 2018-04-25 MED ORDER — DEXTROSE 5 % IV SOLN
INTRAVENOUS | Status: DC
  Administered 2018-04-25: 12:00:00 via INTRAVENOUS
  Filled 2018-04-25 (×2): qty 250

## 2018-04-25 MED ORDER — FAMOTIDINE 20 MG PO TABS
20.0000 mg | ORAL_TABLET | Freq: Once | ORAL | Status: AC
  Administered 2018-04-25: 20 mg via ORAL

## 2018-04-25 NOTE — Progress Notes (Signed)
Okay to give IVIG today w/ last infusion 7/19.   Demetrius Charity, PharmD Oncology Pharmacist Pharmacy Phone: [phone] 04/25/2018

## 2018-04-25 NOTE — Progress Notes (Signed)
[place] OFFICE PROGRESS NOTE  [name], MD  ASSESSMENT & PLAN:  Idiopathic thrombocytopenic purpura ([place]) The patient has recurrent ITP, with poor tolerance to steroid treatment, subsequently had near complete response to IVIG For now, the plan would be monthly IVIG Her platelet count looks a little reduced but overall stable I plan to see her back again at the end of the month for further treatment   Bleeding hemorrhoid She has not observed any bleeding hemorrhoid in the last 3 weeks since the last time I saw her I recommend close observation and dietary fiber as tolerated  Hypertrophic obstructive cardiomyopathy ([place]) Examination today is satisfactory without any signs of congestive heart failure I would defer to her cardiologist for further management   No orders of the defined types were placed in this encounter.   INTERVAL HISTORY: [name] 84 y.o. female returns for further ITP treatment She tolerated last treatment well without hives She has not observed any recent rectal bleeding Overall, she feels well No recent infection, fever or chills No recent cough, chest pain, shortness of breath or leg swelling  SUMMARY OF HEMATOLOGIC HISTORY:  This is a pleasant lady with chronic thrombocytopenia. The patient was discovered to have low platelet count when she was placed on Plavix for coronary artery disease. 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 She is being observed. On 01/04/2016, she was started on prednisone therapy for ITP when her platelet count dip under  50,000 May 2017, she tolerated prednisone poorly and prednisone is slowly tapered off 05/05/2017, she had relapsed ITP and started on IVIG with complete response to Rx but with platelet count trending down after  4 weeks. She is then scheduled for IVIG every 3-4 weeks  I have reviewed the past medical history, past surgical history, social history and family history with the patient and they are unchanged from previous note.  ALLERGIES:  is allergic to bee venom; iodides; lyrica [pregabalin]; codeine; demerol [meperidine]; ambien [zolpidem tartrate]; aspirin; celebrex [celecoxib]; darvocet [propoxyphene n-acetaminophen]; fosamax [alendronate sodium]; lipitor [atorvastatin]; morphine and related; valium [diazepam]; and iodine.  MEDICATIONS:  Current Outpatient Medications  Medication Sig Dispense Refill  . amLODipine (NORVASC) 5 MG tablet Take 1 tablet (5 mg total) by mouth daily. 90 tablet 3  . calcium citrate (CALCITRATE - DOSED IN MG ELEMENTAL CALCIUM) 950 MG tablet Take 1 tablet by mouth daily.    . cetirizine (ZYRTEC) 10 MG tablet Take 10 mg by mouth daily.    . Cholecalciferol (VITAMIN D-3 PO) Take 1 tablet by mouth daily.    Marland Kitchen EPINEPHrine (ADRENALIN) 1 MG/ML injection Inject 1 mg as directed as needed for anaphylaxis. Reported on 02/01/2016    . ezetimibe (ZETIA) 10 MG tablet Take 1 tablet (10 mg total) by mouth daily. 30 tablet 6  . lidocaine-prilocaine (EMLA) cream Apply 1 application topically as needed. 30 g 6  . Lysine 500 MG CAPS Take 1 capsule by mouth 2 (two) times daily as needed (fever blisters).     . meclizine (ANTIVERT) 25 MG tablet Take 25 mg by mouth daily as needed for dizziness.     . Multiple Vitamins-Minerals (CENTRUM SILVER 50+WOMEN PO) Take 1 tablet by mouth daily.    . nitroGLYCERIN (NITROSTAT) 0.4 MG SL tablet Place 0.4 mg under the tongue every 5 (five) minutes as needed for  chest pain. Reported on 02/01/2016    . Omega 3 1000 MG CAPS Take 1 each by mouth daily.     Marland Kitchen omeprazole (PRILOSEC) 40 MG capsule Take 40 mg by mouth daily.    . Prenatal Vit-Fe Fumarate-FA (PRENATAL VITAMIN PO) Take 1 tablet by mouth 2 (two) times daily.    . psyllium (METAMUCIL) 58.6 % powder Take 1  packet by mouth daily.    . rosuvastatin (CRESTOR) 10 MG tablet Take 10 mg by mouth daily.  3  . sotalol (BETAPACE) 160 MG tablet TAKE 1 TABLET (160 MG TOTAL) BY MOUTH 2 (TWO) TIMES DAILY. 60 tablet 10   No current facility-administered medications for this visit.    Facility-Administered Medications Ordered in Other Visits  Medication Dose Route Frequency Provider Last Rate Last Dose  . sodium chloride flush (NS) 0.9 % injection 10 mL  10 mL Intravenous PRN Alvy Bimler, Damyia Strider, MD   10 mL at 09/22/17 1322     REVIEW OF SYSTEMS:   Constitutional: Denies fevers, chills or night sweats Eyes: Denies blurriness of vision Ears, nose, mouth, throat, and face: Denies mucositis or sore throat Respiratory: Denies cough, dyspnea or wheezes Cardiovascular: Denies palpitation, chest discomfort or lower extremity swelling Gastrointestinal:  Denies nausea, heartburn or change in bowel habits Skin: Denies abnormal skin rashes Lymphatics: Denies new lymphadenopathy or easy bruising Neurological:Denies numbness, tingling or new weaknesses Behavioral/Psych: Mood is stable, no new changes  All other systems were reviewed with the patient and are negative.  PHYSICAL EXAMINATION: ECOG PERFORMANCE STATUS: 1 - Symptomatic but completely ambulatory  Vitals:   04/25/18 0835  BP: (!) 147/59  Pulse: (!) 58  Resp: 17  Temp: 97.7 F (36.5 C)  SpO2: 99%   Filed Weights   04/25/18 0835  Weight: 137 lb 3.2 oz (62.2 kg)    GENERAL:alert, no distress and comfortable SKIN: skin color, texture, turgor are normal, no rashes or significant lesions EYES: normal, Conjunctiva are pink and non-injected, sclera clear OROPHARYNX:no exudate, no erythema and lips, buccal mucosa, and tongue normal  NECK: supple, thyroid normal size, non-tender, without nodularity LYMPH:  no palpable lymphadenopathy in the cervical, axillary or inguinal LUNGS: clear to auscultation and percussion with normal breathing effort HEART: regular  rate & rhythm with soft systolic murmur on the left sternal border, and no lower extremity edema ABDOMEN:abdomen soft, non-tender and normal bowel sounds Musculoskeletal:no cyanosis of digits and no clubbing  NEURO: alert & oriented x 3 with fluent speech, no focal motor/sensory deficits  LABORATORY DATA:  I have reviewed the data as listed     Component Value Date/Time   NA 137 07/11/2017 0933   NA 142 05/05/2017 0850   K 4.3 07/11/2017 0933   K 4.0 05/05/2017 0850   CL 104 07/11/2017 0933   CL 104 08/02/2012 1515   CO2 24 07/11/2017 0933   CO2 25 05/05/2017 0850   GLUCOSE 110 (H) 07/11/2017 0933   GLUCOSE 130 05/05/2017 0850   GLUCOSE 105 (H) 08/02/2012 1515   BUN 17 07/11/2017 0933   BUN 18.9 05/05/2017 0850   CREATININE 0.97 07/11/2017 0933   CREATININE 0.9 05/05/2017 0850   CALCIUM 9.7 07/11/2017 0933   CALCIUM 10.0 05/05/2017 0850   PROT 6.9 05/05/2017 0850   ALBUMIN 3.8 05/05/2017 0850   AST 19 05/05/2017 0850   ALT 14 05/05/2017 0850   ALKPHOS 54 05/05/2017 0850   BILITOT 0.48 05/05/2017 0850   GFRNONAA 53 (L) 07/11/2017 0933   GFRAA >60 07/11/2017  3735    No results found for: SPEP, UPEP  Lab Results  Component Value Date   WBC 5.2 04/25/2018   NEUTROABS 2.5 04/25/2018   HGB 12.0 04/25/2018   HCT 36.4 04/25/2018   MCV 86.9 04/25/2018   PLT 121 (L) 04/25/2018      Chemistry      Component Value Date/Time   NA 137 07/11/2017 0933   NA 142 05/05/2017 0850   K 4.3 07/11/2017 0933   K 4.0 05/05/2017 0850   CL 104 07/11/2017 0933   CL 104 08/02/2012 1515   CO2 24 07/11/2017 0933   CO2 25 05/05/2017 0850   BUN 17 07/11/2017 0933   BUN 18.9 05/05/2017 0850   CREATININE 0.97 07/11/2017 0933   CREATININE 0.9 05/05/2017 0850      Component Value Date/Time   CALCIUM 9.7 07/11/2017 0933   CALCIUM 10.0 05/05/2017 0850   ALKPHOS 54 05/05/2017 0850   AST 19 05/05/2017 0850   ALT 14 05/05/2017 0850   BILITOT 0.48 05/05/2017 0850       I spent 10  minutes counseling the patient face to face. The total time spent in the appointment was 15 minutes and more than 50% was on counseling.   All questions were answered. The patient knows to call the clinic with any problems, questions or concerns. No barriers to learning was detected.    Heath Lark, MD 8/7/20199:51 AM

## 2018-04-25 NOTE — Assessment & Plan Note (Signed)
Examination today is satisfactory without any signs of congestive heart failure I would defer to her cardiologist for further management

## 2018-04-25 NOTE — Assessment & Plan Note (Signed)
She has not observed any bleeding hemorrhoid in the last 3 weeks since the last time I saw her I recommend close observation and dietary fiber as tolerated

## 2018-04-25 NOTE — Progress Notes (Signed)
Per patient request spoke with [name] requesting Tylenol 650 mg after the IVIG is finished and prior to going home. Patient stated that she usually brings her own IVIG but forgot today because she feels bad. [name] put in order for Tylenol 650 mg.

## 2018-04-25 NOTE — Telephone Encounter (Signed)
Per 8/7 los, no new orders.

## 2018-04-25 NOTE — Assessment & Plan Note (Signed)
The patient has recurrent ITP, with poor tolerance to steroid treatment, subsequently had near complete response to IVIG For now, the plan would be monthly IVIG Her platelet count looks a little reduced but overall stable I plan to see her back again at the end of the month for further treatment

## 2018-04-25 NOTE — Patient Instructions (Signed)

## 2018-04-26 ENCOUNTER — Other Ambulatory Visit: Payer: Medicare HMO

## 2018-04-26 ENCOUNTER — Ambulatory Visit: Payer: Medicare HMO | Admitting: Hematology and Oncology

## 2018-04-26 ENCOUNTER — Telehealth: Payer: Self-pay

## 2018-04-26 NOTE — Telephone Encounter (Signed)
Please consult with [facility] Treatment is every 3-4 weeks for life

## 2018-04-26 NOTE — Telephone Encounter (Signed)
Called back and talked with Alesa, pharmacist. Given below message from Dr. Alvy Bimler regarding frequency of infusion. Given dosage amount of 55 gm's and height/weight. She verbalized understanding.

## 2018-04-26 NOTE — Telephone Encounter (Signed)
Delrae Rend, pharmacist with [facility] called and left a message she is working on prior authorization for IVIG, [employer]. She needs dosage amount and frequency of infusion.

## 2018-04-27 ENCOUNTER — Ambulatory Visit: Payer: Medicare HMO

## 2018-05-17 ENCOUNTER — Other Ambulatory Visit: Payer: Self-pay | Admitting: Hematology and Oncology

## 2018-05-17 DIAGNOSIS — D693 Immune thrombocytopenic purpura: Secondary | ICD-10-CM

## 2018-05-18 ENCOUNTER — Inpatient Hospital Stay: Payer: Medicare HMO

## 2018-05-18 ENCOUNTER — Encounter: Payer: Self-pay | Admitting: Hematology and Oncology

## 2018-05-18 ENCOUNTER — Inpatient Hospital Stay (HOSPITAL_BASED_OUTPATIENT_CLINIC_OR_DEPARTMENT_OTHER): Payer: Medicare HMO | Admitting: Hematology and Oncology

## 2018-05-18 VITALS — BP 141/60 | HR 49 | Temp 98.4°F | Resp 16

## 2018-05-18 DIAGNOSIS — D693 Immune thrombocytopenic purpura: Secondary | ICD-10-CM

## 2018-05-18 DIAGNOSIS — Z95828 Presence of other vascular implants and grafts: Secondary | ICD-10-CM

## 2018-05-18 DIAGNOSIS — I421 Obstructive hypertrophic cardiomyopathy: Secondary | ICD-10-CM

## 2018-05-18 LAB — CBC WITH DIFFERENTIAL/PLATELET
BASOS ABS: 0 10*3/uL (ref 0.0–0.1)
Basophils Relative: 0 %
Eosinophils Absolute: 0 10*3/uL (ref 0.0–0.5)
Eosinophils Relative: 1 %
HEMATOCRIT: 34.3 % — AB (ref 34.8–46.6)
HEMOGLOBIN: 11.4 g/dL — AB (ref 11.6–15.9)
LYMPHS PCT: 31 %
Lymphs Abs: 1.8 10*3/uL (ref 0.9–3.3)
MCH: 29.1 pg (ref 25.1–34.0)
MCHC: 33.2 g/dL (ref 31.5–36.0)
MCV: 87.6 fL (ref 79.5–101.0)
MONO ABS: 0.6 10*3/uL (ref 0.1–0.9)
MONOS PCT: 10 %
NEUTROS ABS: 3.3 10*3/uL (ref 1.5–6.5)
NEUTROS PCT: 58 %
Platelets: 116 10*3/uL — ABNORMAL LOW (ref 145–400)
RBC: 3.92 MIL/uL (ref 3.70–5.45)
RDW: 14.6 % — AB (ref 11.2–14.5)
WBC: 5.8 10*3/uL (ref 3.9–10.3)

## 2018-05-18 MED ORDER — SODIUM CHLORIDE 0.9% FLUSH
10.0000 mL | Freq: Once | INTRAVENOUS | Status: AC
  Administered 2018-05-18: 10 mL
  Filled 2018-05-18: qty 10

## 2018-05-18 MED ORDER — ACETAMINOPHEN 325 MG PO TABS
650.0000 mg | ORAL_TABLET | Freq: Once | ORAL | Status: AC
  Administered 2018-05-18: 650 mg via ORAL

## 2018-05-18 MED ORDER — ACETAMINOPHEN 325 MG PO TABS
ORAL_TABLET | ORAL | Status: AC
  Filled 2018-05-18: qty 2

## 2018-05-18 MED ORDER — FAMOTIDINE 20 MG PO TABS
ORAL_TABLET | ORAL | Status: AC
  Filled 2018-05-18: qty 1

## 2018-05-18 MED ORDER — DEXTROSE 5 % IV SOLN
INTRAVENOUS | Status: DC
  Administered 2018-05-18: 10:00:00 via INTRAVENOUS
  Filled 2018-05-18: qty 250

## 2018-05-18 MED ORDER — HEPARIN SOD (PORK) LOCK FLUSH 100 UNIT/ML IV SOLN
500.0000 [IU] | Freq: Once | INTRAVENOUS | Status: AC
  Administered 2018-05-18: 500 [IU]
  Filled 2018-05-18: qty 5

## 2018-05-18 MED ORDER — DIPHENHYDRAMINE HCL 25 MG PO CAPS
ORAL_CAPSULE | ORAL | Status: AC
  Filled 2018-05-18: qty 1

## 2018-05-18 MED ORDER — FAMOTIDINE 20 MG PO TABS
20.0000 mg | ORAL_TABLET | Freq: Once | ORAL | Status: AC
  Administered 2018-05-18: 20 mg via ORAL

## 2018-05-18 MED ORDER — IMMUNE GLOBULIN (HUMAN) 20 GM/200ML IV SOLN
55.0000 g | Freq: Once | INTRAVENOUS | Status: AC
  Administered 2018-05-18: 55 g via INTRAVENOUS
  Filled 2018-05-18: qty 400

## 2018-05-18 MED ORDER — DIPHENHYDRAMINE HCL 25 MG PO TABS
25.0000 mg | ORAL_TABLET | Freq: Once | ORAL | Status: AC
  Administered 2018-05-18: 25 mg via ORAL
  Filled 2018-05-18: qty 1

## 2018-05-18 NOTE — Progress Notes (Signed)
[facility] OFFICE PROGRESS NOTE  Adrienne Ada, MD  ASSESSMENT & PLAN:  Idiopathic thrombocytopenic purpura ([place]) The patient has recurrent ITP, with poor tolerance to steroid treatment, subsequently had near complete response to IVIG For now, the plan would be monthly IVIG She does not have serum sickness Her platelet count looks a little reduced but overall stable I plan to see her back  Next month for further treatment   Hypertrophic obstructive cardiomyopathy ([place]) Examination today is satisfactory without any signs of congestive heart failure I would defer to her cardiologist for further management   No orders of the defined types were placed in this encounter.   INTERVAL HISTORY: [name] 84 y.o. female returns for IVIG treatment and follow-up She had rare occasional hemorrhoidal bleeding Denies recent serum sickness or highs from treatment  SUMMARY OF HEMATOLOGIC HISTORY:  This is a pleasant lady with chronic thrombocytopenia. The patient was discovered to have low platelet count when she was placed on Plavix for coronary artery disease. 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 She is being observed. On 01/04/2016, she was started on prednisone therapy for ITP when her platelet count dip under  50,000 May 2017, she tolerated prednisone poorly and prednisone is slowly tapered off 05/05/2017, she had relapsed ITP and started on IVIG with complete response to Rx but with platelet count trending down after 4 weeks. She is then scheduled for IVIG every 3-4 weeks  I have reviewed the past medical history, past surgical history, social history and family history with the patient and they are unchanged from previous note.  ALLERGIES:  is allergic to bee venom; iodides; lyrica  [pregabalin]; codeine; demerol [meperidine]; ambien [zolpidem tartrate]; aspirin; celebrex [celecoxib]; darvocet [propoxyphene n-acetaminophen]; fosamax [alendronate sodium]; lipitor [atorvastatin]; morphine and related; valium [diazepam]; and iodine.  MEDICATIONS:  Current Outpatient Medications  Medication Sig Dispense Refill  . amLODipine (NORVASC) 5 MG tablet Take 1 tablet (5 mg total) by mouth daily. 90 tablet 3  . calcium citrate (CALCITRATE - DOSED IN MG ELEMENTAL CALCIUM) 950 MG tablet Take 1 tablet by mouth daily.    . cetirizine (ZYRTEC) 10 MG tablet Take 10 mg by mouth daily.    . Cholecalciferol (VITAMIN D-3 PO) Take 1 tablet by mouth daily.    Marland Kitchen EPINEPHrine (ADRENALIN) 1 MG/ML injection Inject 1 mg as directed as needed for anaphylaxis. Reported on 02/01/2016    . ezetimibe (ZETIA) 10 MG tablet Take 1 tablet (10 mg total) by mouth daily. 30 tablet 6  . lidocaine-prilocaine (EMLA) cream Apply 1 application topically as needed. 30 g 6  . Lysine 500 MG CAPS Take 1 capsule by mouth 2 (two) times daily as needed (fever blisters).     . meclizine (ANTIVERT) 25 MG tablet Take 25 mg by mouth daily as needed for dizziness.     . Multiple Vitamins-Minerals (CENTRUM SILVER 50+WOMEN PO) Take 1 tablet by mouth daily.    . nitroGLYCERIN (NITROSTAT) 0.4 MG SL tablet Place 0.4 mg under the tongue every 5 (five) minutes as needed for chest pain. Reported on 02/01/2016    . Omega 3 1000 MG CAPS Take 1 each by mouth daily.     Marland Kitchen omeprazole (PRILOSEC) 40 MG capsule Take 40 mg by mouth daily.    . Prenatal Vit-Fe Fumarate-FA (PRENATAL VITAMIN PO) Take 1 tablet by  mouth 2 (two) times daily.    . psyllium (METAMUCIL) 58.6 % powder Take 1 packet by mouth daily.    . rosuvastatin (CRESTOR) 10 MG tablet Take 10 mg by mouth daily.  3  . sotalol (BETAPACE) 160 MG tablet TAKE 1 TABLET (160 MG TOTAL) BY MOUTH 2 (TWO) TIMES DAILY. 60 tablet 10   No current facility-administered medications for this visit.     Facility-Administered Medications Ordered in Other Visits  Medication Dose Route Frequency Provider Last Rate Last Dose  . sodium chloride flush (NS) 0.9 % injection 10 mL  10 mL Intravenous PRN Alvy Bimler, Toma Erichsen, MD   10 mL at 09/22/17 1322     REVIEW OF SYSTEMS:   Constitutional: Denies fevers, chills or night sweats Eyes: Denies blurriness of vision Ears, nose, mouth, throat, and face: Denies mucositis or sore throat Respiratory: Denies cough, dyspnea or wheezes Cardiovascular: Denies palpitation, chest discomfort or lower extremity swelling Gastrointestinal:  Denies nausea, heartburn or change in bowel habits Skin: Denies abnormal skin rashes Lymphatics: Denies new lymphadenopathy or easy bruising Neurological:Denies numbness, tingling or new weaknesses Behavioral/Psych: Mood is stable, no new changes  All other systems were reviewed with the patient and are negative.  PHYSICAL EXAMINATION: ECOG PERFORMANCE STATUS: 1 - Symptomatic but completely ambulatory  Vitals:   05/18/18 0918  BP: (!) 127/52  Pulse: (!) 52  Resp: 16  Temp: 97.8 F (36.6 C)  SpO2: 98%   Filed Weights   05/18/18 0918  Weight: 137 lb 9.6 oz (62.4 kg)    GENERAL:alert, no distress and comfortable SKIN: skin color, texture, turgor are normal, no rashes or significant lesions EYES: normal, Conjunctiva are pink and non-injected, sclera clear OROPHARYNX:no exudate, no erythema and lips, buccal mucosa, and tongue normal  NECK: supple, thyroid normal size, non-tender, without nodularity LYMPH:  no palpable lymphadenopathy in the cervical, axillary or inguinal LUNGS: clear to auscultation and percussion with normal breathing effort HEART: regular rate & rhythm with mild left-sided murmurs and no lower extremity edema ABDOMEN:abdomen soft, non-tender and normal bowel sounds Musculoskeletal:no cyanosis of digits and no clubbing  NEURO: alert & oriented x 3 with fluent speech, no focal motor/sensory  deficits  LABORATORY DATA:  I have reviewed the data as listed     Component Value Date/Time   NA 137 07/11/2017 0933   NA 142 05/05/2017 0850   K 4.3 07/11/2017 0933   K 4.0 05/05/2017 0850   CL 104 07/11/2017 0933   CL 104 08/02/2012 1515   CO2 24 07/11/2017 0933   CO2 25 05/05/2017 0850   GLUCOSE 110 (H) 07/11/2017 0933   GLUCOSE 130 05/05/2017 0850   GLUCOSE 105 (H) 08/02/2012 1515   BUN 17 07/11/2017 0933   BUN 18.9 05/05/2017 0850   CREATININE 0.97 07/11/2017 0933   CREATININE 0.9 05/05/2017 0850   CALCIUM 9.7 07/11/2017 0933   CALCIUM 10.0 05/05/2017 0850   PROT 6.9 05/05/2017 0850   ALBUMIN 3.8 05/05/2017 0850   AST 19 05/05/2017 0850   ALT 14 05/05/2017 0850   ALKPHOS 54 05/05/2017 0850   BILITOT 0.48 05/05/2017 0850   GFRNONAA 53 (L) 07/11/2017 0933   GFRAA >60 07/11/2017 0933    No results found for: SPEP, UPEP  Lab Results  Component Value Date   WBC 5.8 05/18/2018   NEUTROABS 3.3 05/18/2018   HGB 11.4 (L) 05/18/2018   HCT 34.3 (L) 05/18/2018   MCV 87.6 05/18/2018   PLT 116 (L) 05/18/2018  Chemistry      Component Value Date/Time   NA 137 07/11/2017 0933   NA 142 05/05/2017 0850   K 4.3 07/11/2017 0933   K 4.0 05/05/2017 0850   CL 104 07/11/2017 0933   CL 104 08/02/2012 1515   CO2 24 07/11/2017 0933   CO2 25 05/05/2017 0850   BUN 17 07/11/2017 0933   BUN 18.9 05/05/2017 0850   CREATININE 0.97 07/11/2017 0933   CREATININE 0.9 05/05/2017 0850      Component Value Date/Time   CALCIUM 9.7 07/11/2017 0933   CALCIUM 10.0 05/05/2017 0850   ALKPHOS 54 05/05/2017 0850   AST 19 05/05/2017 0850   ALT 14 05/05/2017 0850   BILITOT 0.48 05/05/2017 0850       I spent 10 minutes counseling the patient face to face. The total time spent in the appointment was 15 minutes and more than 50% was on counseling.   All questions were answered. The patient knows to call the clinic with any problems, questions or concerns. No barriers to learning was  detected.    Heath Lark, MD 8/30/20199:34 AM

## 2018-05-18 NOTE — Assessment & Plan Note (Signed)
The patient has recurrent ITP, with poor tolerance to steroid treatment, subsequently had near complete response to IVIG For now, the plan would be monthly IVIG She does not have serum sickness Her platelet count looks a little reduced but overall stable I plan to see her back  Next month for further treatment

## 2018-05-18 NOTE — Progress Notes (Signed)
Okay per Sandi Mealy, PA for Tylenol 650mg  upon discharge.

## 2018-05-18 NOTE — Assessment & Plan Note (Signed)
Examination today is satisfactory without any signs of congestive heart failure I would defer to her cardiologist for further management

## 2018-05-18 NOTE — Patient Instructions (Signed)

## 2018-06-14 ENCOUNTER — Inpatient Hospital Stay: Payer: Medicare HMO

## 2018-06-14 ENCOUNTER — Encounter: Payer: Self-pay | Admitting: Hematology and Oncology

## 2018-06-14 ENCOUNTER — Inpatient Hospital Stay: Payer: Medicare HMO | Attending: Hematology and Oncology

## 2018-06-14 ENCOUNTER — Telehealth: Payer: Self-pay | Admitting: Hematology and Oncology

## 2018-06-14 ENCOUNTER — Inpatient Hospital Stay (HOSPITAL_BASED_OUTPATIENT_CLINIC_OR_DEPARTMENT_OTHER): Payer: Medicare HMO | Admitting: Hematology and Oncology

## 2018-06-14 VITALS — BP 114/55 | HR 44 | Temp 97.7°F | Resp 16

## 2018-06-14 DIAGNOSIS — Z95828 Presence of other vascular implants and grafts: Secondary | ICD-10-CM

## 2018-06-14 DIAGNOSIS — Z79899 Other long term (current) drug therapy: Secondary | ICD-10-CM | POA: Insufficient documentation

## 2018-06-14 DIAGNOSIS — D693 Immune thrombocytopenic purpura: Secondary | ICD-10-CM | POA: Diagnosis not present

## 2018-06-14 DIAGNOSIS — K649 Unspecified hemorrhoids: Secondary | ICD-10-CM | POA: Diagnosis not present

## 2018-06-14 DIAGNOSIS — I421 Obstructive hypertrophic cardiomyopathy: Secondary | ICD-10-CM | POA: Diagnosis not present

## 2018-06-14 DIAGNOSIS — I2510 Atherosclerotic heart disease of native coronary artery without angina pectoris: Secondary | ICD-10-CM | POA: Diagnosis not present

## 2018-06-14 LAB — CBC WITH DIFFERENTIAL/PLATELET
BASOS ABS: 0 10*3/uL (ref 0.0–0.1)
BASOS PCT: 0 %
EOS ABS: 0 10*3/uL (ref 0.0–0.5)
EOS PCT: 1 %
HCT: 34.7 % — ABNORMAL LOW (ref 34.8–46.6)
Hemoglobin: 11.7 g/dL (ref 11.6–15.9)
Lymphocytes Relative: 38 %
Lymphs Abs: 1.7 10*3/uL (ref 0.9–3.3)
MCH: 29.3 pg (ref 25.1–34.0)
MCHC: 33.8 g/dL (ref 31.5–36.0)
MCV: 86.6 fL (ref 79.5–101.0)
Monocytes Absolute: 0.5 10*3/uL (ref 0.1–0.9)
Monocytes Relative: 11 %
Neutro Abs: 2.2 10*3/uL (ref 1.5–6.5)
Neutrophils Relative %: 50 %
Platelets: 95 10*3/uL — ABNORMAL LOW (ref 145–400)
RBC: 4.01 MIL/uL (ref 3.70–5.45)
RDW: 14 % (ref 11.2–14.5)
WBC: 4.5 10*3/uL (ref 3.9–10.3)

## 2018-06-14 MED ORDER — FAMOTIDINE 20 MG PO TABS
20.0000 mg | ORAL_TABLET | Freq: Once | ORAL | Status: AC
  Administered 2018-06-14: 20 mg via ORAL

## 2018-06-14 MED ORDER — ACETAMINOPHEN 325 MG PO TABS
650.0000 mg | ORAL_TABLET | Freq: Once | ORAL | Status: AC
  Administered 2018-06-14: 650 mg via ORAL

## 2018-06-14 MED ORDER — SODIUM CHLORIDE 0.9% FLUSH
10.0000 mL | Freq: Once | INTRAVENOUS | Status: AC
  Administered 2018-06-14: 10 mL
  Filled 2018-06-14: qty 10

## 2018-06-14 MED ORDER — SODIUM CHLORIDE 0.9% FLUSH
10.0000 mL | Freq: Once | INTRAVENOUS | Status: DC
  Filled 2018-06-14: qty 10

## 2018-06-14 MED ORDER — IMMUNE GLOBULIN (HUMAN) 20 GM/200ML IV SOLN
55.0000 g | Freq: Once | INTRAVENOUS | Status: AC
  Administered 2018-06-14: 55 g via INTRAVENOUS
  Filled 2018-06-14: qty 50

## 2018-06-14 MED ORDER — ACETAMINOPHEN 325 MG PO TABS
ORAL_TABLET | ORAL | Status: AC
  Filled 2018-06-14: qty 2

## 2018-06-14 MED ORDER — FAMOTIDINE 20 MG PO TABS
ORAL_TABLET | ORAL | Status: AC
  Filled 2018-06-14: qty 1

## 2018-06-14 MED ORDER — DIPHENHYDRAMINE HCL 25 MG PO TABS
25.0000 mg | ORAL_TABLET | Freq: Once | ORAL | Status: AC
  Administered 2018-06-14: 25 mg via ORAL
  Filled 2018-06-14: qty 1

## 2018-06-14 MED ORDER — SODIUM CHLORIDE 0.9 % IV SOLN
750.0000 mL | Freq: Once | INTRAVENOUS | Status: AC
  Administered 2018-06-14: 250 mL via INTRAVENOUS
  Filled 2018-06-14: qty 750

## 2018-06-14 MED ORDER — DIPHENHYDRAMINE HCL 25 MG PO CAPS
ORAL_CAPSULE | ORAL | Status: AC
  Filled 2018-06-14: qty 1

## 2018-06-14 MED ORDER — HEPARIN SOD (PORK) LOCK FLUSH 100 UNIT/ML IV SOLN
500.0000 [IU] | Freq: Once | INTRAVENOUS | Status: DC
  Filled 2018-06-14: qty 5

## 2018-06-14 NOTE — Assessment & Plan Note (Signed)
She continues to have intermittent hemorrhoidal bleeding. She is asking for recommendation Previously, we discussed dietary change and increase dietary fiber as tolerated Today, we discussed potential referral to GI service or colorectal surgeon for evaluation and management. She would like to discuss this with her primary care doctor and declined referral from me today.

## 2018-06-14 NOTE — Progress Notes (Signed)
[facility] OFFICE PROGRESS NOTE  Adrienne Ada, MD  ASSESSMENT & PLAN:  Idiopathic thrombocytopenic purpura ([place]) The patient has recurrent ITP, with poor tolerance to steroid treatment, subsequently had near complete response to IVIG For now, the plan would be monthly IVIG She does not have serum sickness Her platelet count looks a little reduced but overall stable Goal is just to keep her platelet count above 50 I plan to see her back  Next month for further treatment   Bleeding hemorrhoid She continues to have intermittent hemorrhoidal bleeding. She is asking for recommendation Previously, we discussed dietary change and increase dietary fiber as tolerated Today, we discussed potential referral to GI service or colorectal surgeon for evaluation and management. She would like to discuss this with her primary care doctor and declined referral from me today.  Hypertrophic obstructive cardiomyopathy ([facility]) Examination today is satisfactory without any signs of congestive heart failure I would defer to her cardiologist for further management   No orders of the defined types were placed in this encounter.   INTERVAL HISTORY: [name] 84 y.o. female returns for her monthly IVIG infusion She feels well She continues to have intermittent hemorrhoidal bleeding over the past month She denies epistaxis, or hematuria No recent infection, fever or chills She denies infusion reaction.  No recent hives or rash.  No recent exacerbation of congestive heart failure. She has not received her influenza vaccination and declined influenza vaccination here today.  SUMMARY OF HEMATOLOGIC HISTORY:  This is a pleasant lady with chronic thrombocytopenia. The patient was discovered to have low platelet count when she was placed on Plavix for coronary artery disease. Plavix was discontinued due to thrombocytopenia and history of cerebral hemorrhage In November 2013, she had a  bone marrow aspirate and biopsy which showed abundant megakaryocytes, giving a probable diagnosis of ITP. She is being observed. On 01/04/2016, she was started on prednisone therapy for ITP when her platelet count dip under  50,000 May 2017, she tolerated prednisone poorly and prednisone is slowly tapered off 05/05/2017, she had relapsed ITP and started on IVIG with complete response to Rx but with platelet count trending down after 4 weeks. She is then scheduled for IVIG every 3-4 weeks  I have reviewed the past medical history, past surgical history, social history and family history with the patient and they are unchanged from previous note.  ALLERGIES:  is allergic to bee venom; iodides; lyrica [pregabalin]; codeine; demerol [meperidine]; ambien [zolpidem tartrate]; aspirin; celebrex [celecoxib]; darvocet [propoxyphene n-acetaminophen]; fosamax [alendronate sodium]; lipitor [atorvastatin]; morphine and related; valium [diazepam]; and iodine.  MEDICATIONS:  Current Outpatient Medications  Medication Sig Dispense Refill  . amLODipine (NORVASC) 5 MG tablet Take 1 tablet (5 mg total) by mouth daily. 90 tablet 3  . calcium citrate (CALCITRATE - DOSED IN MG ELEMENTAL CALCIUM) 950 MG tablet Take 1 tablet by mouth daily.    . cetirizine (ZYRTEC) 10 MG tablet Take 10 mg by mouth daily.    . Cholecalciferol (VITAMIN D-3 PO) Take 1 tablet by mouth daily.    Marland Kitchen EPINEPHrine (ADRENALIN) 1 MG/ML injection Inject 1 mg as directed as needed for anaphylaxis. Reported on 02/01/2016    . ezetimibe (ZETIA) 10 MG tablet Take 1 tablet (10 mg total) by mouth daily. 30 tablet 6  . lidocaine-prilocaine (EMLA) cream Apply 1 application topically as needed. 30 g 6  . Lysine 500 MG CAPS Take 1 capsule by mouth 2 (two) times daily as needed (fever blisters).     Marland Kitchen  meclizine (ANTIVERT) 25 MG tablet Take 25 mg by mouth daily as needed for dizziness.     . Multiple Vitamins-Minerals (CENTRUM SILVER 50+WOMEN PO) Take 1 tablet  by mouth daily.    . nitroGLYCERIN (NITROSTAT) 0.4 MG SL tablet Place 0.4 mg under the tongue every 5 (five) minutes as needed for chest pain. Reported on 02/01/2016    . Omega 3 1000 MG CAPS Take 1 each by mouth daily.     Marland Kitchen omeprazole (PRILOSEC) 40 MG capsule Take 40 mg by mouth daily.    . Prenatal Vit-Fe Fumarate-FA (PRENATAL VITAMIN PO) Take 1 tablet by mouth 2 (two) times daily.    . psyllium (METAMUCIL) 58.6 % powder Take 1 packet by mouth daily.    . rosuvastatin (CRESTOR) 10 MG tablet Take 10 mg by mouth daily.  3  . sotalol (BETAPACE) 160 MG tablet TAKE 1 TABLET (160 MG TOTAL) BY MOUTH 2 (TWO) TIMES DAILY. 60 tablet 10   No current facility-administered medications for this visit.    Facility-Administered Medications Ordered in Other Visits  Medication Dose Route Frequency Provider Last Rate Last Dose  . sodium chloride flush (NS) 0.9 % injection 10 mL  10 mL Intravenous PRN Alvy Bimler, Batool Majid, MD   10 mL at 09/22/17 1322     REVIEW OF SYSTEMS:   Constitutional: Denies fevers, chills or night sweats Eyes: Denies blurriness of vision Ears, nose, mouth, throat, and face: Denies mucositis or sore throat Respiratory: Denies cough, dyspnea or wheezes Cardiovascular: Denies palpitation, chest discomfort or lower extremity swelling Gastrointestinal:  Denies nausea, heartburn or change in bowel habits Skin: Denies abnormal skin rashes Lymphatics: Denies new lymphadenopathy or easy bruising Neurological:Denies numbness, tingling or new weaknesses Behavioral/Psych: Mood is stable, no new changes  All other systems were reviewed with the patient and are negative.  PHYSICAL EXAMINATION: ECOG PERFORMANCE STATUS: 1 - Symptomatic but completely ambulatory  Vitals:   06/14/18 0944  BP: (!) 124/56  Pulse: (!) 52  Resp: 18  Temp: 97.8 F (36.6 C)  SpO2: 98%   Filed Weights   06/14/18 0944  Weight: 134 lb 12.8 oz (61.1 kg)    GENERAL:alert, no distress and comfortable SKIN: skin color,  texture, turgor are normal, no rashes or significant lesions EYES: normal, Conjunctiva are pink and non-injected, sclera clear OROPHARYNX:no exudate, no erythema and lips, buccal mucosa, and tongue normal  NECK: supple, thyroid normal size, non-tender, without nodularity LYMPH:  no palpable lymphadenopathy in the cervical, axillary or inguinal LUNGS: clear to auscultation and percussion with normal breathing effort HEART: regular rate & rhythm with soft heart murmur and no lower extremity edema ABDOMEN:abdomen soft, non-tender and normal bowel sounds Musculoskeletal:no cyanosis of digits and no clubbing  NEURO: alert & oriented x 3 with fluent speech, no focal motor/sensory deficits  LABORATORY DATA:  I have reviewed the data as listed     Component Value Date/Time   NA 137 07/11/2017 0933   NA 142 05/05/2017 0850   K 4.3 07/11/2017 0933   K 4.0 05/05/2017 0850   CL 104 07/11/2017 0933   CL 104 08/02/2012 1515   CO2 24 07/11/2017 0933   CO2 25 05/05/2017 0850   GLUCOSE 110 (H) 07/11/2017 0933   GLUCOSE 130 05/05/2017 0850   GLUCOSE 105 (H) 08/02/2012 1515   BUN 17 07/11/2017 0933   BUN 18.9 05/05/2017 0850   CREATININE 0.97 07/11/2017 0933   CREATININE 0.9 05/05/2017 0850   CALCIUM 9.7 07/11/2017 0933   CALCIUM 10.0  05/05/2017 0850   PROT 6.9 05/05/2017 0850   ALBUMIN 3.8 05/05/2017 0850   AST 19 05/05/2017 0850   ALT 14 05/05/2017 0850   ALKPHOS 54 05/05/2017 0850   BILITOT 0.48 05/05/2017 0850   GFRNONAA 53 (L) 07/11/2017 0933   GFRAA >60 07/11/2017 0933    No results found for: SPEP, UPEP  Lab Results  Component Value Date   WBC 4.5 06/14/2018   NEUTROABS 2.2 06/14/2018   HGB 11.7 06/14/2018   HCT 34.7 (L) 06/14/2018   MCV 86.6 06/14/2018   PLT 95 (L) 06/14/2018      Chemistry      Component Value Date/Time   NA 137 07/11/2017 0933   NA 142 05/05/2017 0850   K 4.3 07/11/2017 0933   K 4.0 05/05/2017 0850   CL 104 07/11/2017 0933   CL 104 08/02/2012  1515   CO2 24 07/11/2017 0933   CO2 25 05/05/2017 0850   BUN 17 07/11/2017 0933   BUN 18.9 05/05/2017 0850   CREATININE 0.97 07/11/2017 0933   CREATININE 0.9 05/05/2017 0850      Component Value Date/Time   CALCIUM 9.7 07/11/2017 0933   CALCIUM 10.0 05/05/2017 0850   ALKPHOS 54 05/05/2017 0850   AST 19 05/05/2017 0850   ALT 14 05/05/2017 0850   BILITOT 0.48 05/05/2017 0850       I spent 15 minutes counseling the patient face to face. The total time spent in the appointment was 20 minutes and more than 50% was on counseling.   All questions were answered. The patient knows to call the clinic with any problems, questions or concerns. No barriers to learning was detected.    Heath Lark, MD 9/26/20199:57 AM

## 2018-06-14 NOTE — Assessment & Plan Note (Signed)
Examination today is satisfactory without any signs of congestive heart failure I would defer to her cardiologist for further management

## 2018-06-14 NOTE — Assessment & Plan Note (Signed)
The patient has recurrent ITP, with poor tolerance to steroid treatment, subsequently had near complete response to IVIG For now, the plan would be monthly IVIG She does not have serum sickness Her platelet count looks a little reduced but overall stable Goal is just to keep her platelet count above 50 I plan to see her back  Next month for further treatment

## 2018-06-14 NOTE — Patient Instructions (Signed)

## 2018-06-14 NOTE — Telephone Encounter (Signed)
Gave pt avs and calendar with appts per 9/26 los.  °

## 2018-06-19 ENCOUNTER — Ambulatory Visit (HOSPITAL_COMMUNITY): Payer: Medicare HMO | Admitting: Nurse Practitioner

## 2018-07-11 DIAGNOSIS — I6789 Other cerebrovascular disease: Secondary | ICD-10-CM | POA: Diagnosis not present

## 2018-07-11 DIAGNOSIS — D693 Immune thrombocytopenic purpura: Secondary | ICD-10-CM | POA: Diagnosis not present

## 2018-07-11 DIAGNOSIS — Z23 Encounter for immunization: Secondary | ICD-10-CM | POA: Diagnosis not present

## 2018-07-11 DIAGNOSIS — I10 Essential (primary) hypertension: Secondary | ICD-10-CM | POA: Diagnosis not present

## 2018-07-11 DIAGNOSIS — E782 Mixed hyperlipidemia: Secondary | ICD-10-CM | POA: Diagnosis not present

## 2018-07-11 DIAGNOSIS — L989 Disorder of the skin and subcutaneous tissue, unspecified: Secondary | ICD-10-CM | POA: Diagnosis not present

## 2018-07-11 DIAGNOSIS — Z0000 Encounter for general adult medical examination without abnormal findings: Secondary | ICD-10-CM | POA: Diagnosis not present

## 2018-07-11 DIAGNOSIS — R69 Illness, unspecified: Secondary | ICD-10-CM | POA: Diagnosis not present

## 2018-07-11 DIAGNOSIS — Z1389 Encounter for screening for other disorder: Secondary | ICD-10-CM | POA: Diagnosis not present

## 2018-07-11 DIAGNOSIS — K219 Gastro-esophageal reflux disease without esophagitis: Secondary | ICD-10-CM | POA: Diagnosis not present

## 2018-07-12 ENCOUNTER — Inpatient Hospital Stay: Payer: Medicare HMO

## 2018-07-12 ENCOUNTER — Inpatient Hospital Stay: Payer: Medicare HMO | Attending: Hematology and Oncology | Admitting: Hematology and Oncology

## 2018-07-12 ENCOUNTER — Encounter: Payer: Self-pay | Admitting: Hematology and Oncology

## 2018-07-12 VITALS — BP 108/57 | HR 44 | Temp 97.7°F | Resp 16

## 2018-07-12 DIAGNOSIS — J302 Other seasonal allergic rhinitis: Secondary | ICD-10-CM | POA: Diagnosis not present

## 2018-07-12 DIAGNOSIS — Z95828 Presence of other vascular implants and grafts: Secondary | ICD-10-CM

## 2018-07-12 DIAGNOSIS — I2510 Atherosclerotic heart disease of native coronary artery without angina pectoris: Secondary | ICD-10-CM | POA: Diagnosis not present

## 2018-07-12 DIAGNOSIS — Z79899 Other long term (current) drug therapy: Secondary | ICD-10-CM | POA: Diagnosis not present

## 2018-07-12 DIAGNOSIS — D693 Immune thrombocytopenic purpura: Secondary | ICD-10-CM | POA: Insufficient documentation

## 2018-07-12 LAB — CBC WITH DIFFERENTIAL/PLATELET
Abs Immature Granulocytes: 0.01 10*3/uL (ref 0.00–0.07)
BASOS ABS: 0 10*3/uL (ref 0.0–0.1)
BASOS PCT: 0 %
EOS ABS: 0.1 10*3/uL (ref 0.0–0.5)
EOS PCT: 1 %
HCT: 35.5 % — ABNORMAL LOW (ref 36.0–46.0)
Hemoglobin: 11.8 g/dL — ABNORMAL LOW (ref 12.0–15.0)
Immature Granulocytes: 0 %
Lymphocytes Relative: 32 %
Lymphs Abs: 1.7 10*3/uL (ref 0.7–4.0)
MCH: 29.6 pg (ref 26.0–34.0)
MCHC: 33.2 g/dL (ref 30.0–36.0)
MCV: 89 fL (ref 80.0–100.0)
Monocytes Absolute: 0.7 10*3/uL (ref 0.1–1.0)
Monocytes Relative: 12 %
NRBC: 0 % (ref 0.0–0.2)
Neutro Abs: 3 10*3/uL (ref 1.7–7.7)
Neutrophils Relative %: 55 %
Platelets: 68 10*3/uL — ABNORMAL LOW (ref 150–400)
RBC: 3.99 MIL/uL (ref 3.87–5.11)
RDW: 13.4 % (ref 11.5–15.5)
WBC: 5.4 10*3/uL (ref 4.0–10.5)

## 2018-07-12 MED ORDER — DIPHENHYDRAMINE HCL 25 MG PO CAPS
ORAL_CAPSULE | ORAL | Status: AC
  Filled 2018-07-12: qty 1

## 2018-07-12 MED ORDER — IMMUNE GLOBULIN (HUMAN) 20 GM/200ML IV SOLN
55.0000 g | Freq: Once | INTRAVENOUS | Status: AC
  Administered 2018-07-12: 55 g via INTRAVENOUS
  Filled 2018-07-12: qty 400

## 2018-07-12 MED ORDER — TRIAMCINOLONE ACETONIDE 55 MCG/ACT NA AERO: 2.0000 | INHALATION_SPRAY | Freq: Two times a day (BID) | NASAL | 12 refills | Status: DC

## 2018-07-12 MED ORDER — DIPHENHYDRAMINE HCL 25 MG PO TABS
25.0000 mg | ORAL_TABLET | Freq: Once | ORAL | Status: AC
  Administered 2018-07-12: 25 mg via ORAL
  Filled 2018-07-12: qty 1

## 2018-07-12 MED ORDER — FAMOTIDINE 20 MG PO TABS
ORAL_TABLET | ORAL | Status: AC
  Filled 2018-07-12: qty 1

## 2018-07-12 MED ORDER — ACETAMINOPHEN 325 MG PO TABS
650.0000 mg | ORAL_TABLET | Freq: Once | ORAL | Status: AC
  Administered 2018-07-12: 650 mg via ORAL

## 2018-07-12 MED ORDER — FAMOTIDINE 20 MG PO TABS
20.0000 mg | ORAL_TABLET | Freq: Once | ORAL | Status: AC
  Administered 2018-07-12: 20 mg via ORAL

## 2018-07-12 MED ORDER — SODIUM CHLORIDE 0.9% FLUSH
10.0000 mL | Freq: Once | INTRAVENOUS | Status: AC
  Administered 2018-07-12: 10 mL
  Filled 2018-07-12: qty 10

## 2018-07-12 MED ORDER — ACETAMINOPHEN 325 MG PO TABS
ORAL_TABLET | ORAL | Status: AC
  Filled 2018-07-12: qty 2

## 2018-07-12 NOTE — Assessment & Plan Note (Signed)
The patient has recurrent ITP, with poor tolerance to steroid treatment, subsequently had near complete response to IVIG For now, the plan would be monthly IVIG She does not have serum sickness Her platelet count looks a little reduced but overall stable Goal is just to keep her platelet count above 50  

## 2018-07-12 NOTE — Assessment & Plan Note (Signed)
She has seasonal allergies with congestive symptoms I think this is exacerbating her significant thrombocytopenia I recommend Nasacort.

## 2018-07-12 NOTE — Patient Instructions (Signed)

## 2018-07-13 ENCOUNTER — Encounter: Payer: Self-pay | Admitting: Hematology and Oncology

## 2018-07-13 NOTE — Progress Notes (Signed)
[name]  Adrienne Ada, MD  ASSESSMENT & PLAN:  Idiopathic thrombocytopenic purpura ([place]) The patient has recurrent ITP, with poor tolerance to steroid treatment, subsequently had near complete response to IVIG For now, the plan would be monthly IVIG She does not have serum sickness Her platelet count looks a little reduced but overall stable Goal is just to keep her platelet count above 50   Seasonal allergies She has seasonal allergies with congestive symptoms I think this is exacerbating her significant thrombocytopenia I recommend Nasacort.   No orders of the defined types were placed in this encounter.   INTERVAL HISTORY: [name] 84 y.o. female returns for further follow-up She complained of some nasal congestion but denies fever or chills She denies excessive rectal bleeding recently  SUMMARY OF HEMATOLOGIC HISTORY:  This is a pleasant lady with chronic thrombocytopenia. The patient was discovered to have low platelet count when she was placed on Plavix for coronary artery disease. 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 She is being observed. On 01/04/2016, she was started on prednisone therapy for ITP when her platelet count dip under  50,000 May 2017, she tolerated prednisone poorly and prednisone is slowly tapered off 05/05/2017, she had relapsed ITP and started on IVIG with complete response to Rx but with platelet count trending down after 4 weeks. She is then scheduled for IVIG every 3-4 weeks  I have reviewed the past medical history, past surgical history, social history and family history with the patient and they are unchanged from previous [name].  ALLERGIES:  is allergic to bee venom; iodides; lyrica [pregabalin]; codeine; demerol  [meperidine]; ambien [zolpidem tartrate]; aspirin; celebrex [celecoxib]; darvocet [propoxyphene n-acetaminophen]; fosamax [alendronate sodium]; lipitor [atorvastatin]; morphine and related; valium [diazepam]; and iodine.  MEDICATIONS:  Current Outpatient Medications  Medication Sig Dispense Refill  . amLODipine (NORVASC) 5 MG tablet Take 1 tablet (5 mg total) by mouth daily. 90 tablet 3  . calcium citrate (CALCITRATE - DOSED IN MG ELEMENTAL CALCIUM) 950 MG tablet Take 1 tablet by mouth daily.    . cetirizine (ZYRTEC) 10 MG tablet Take 10 mg by mouth daily.    . Cholecalciferol (VITAMIN D-3 PO) Take 1 tablet by mouth daily.    Marland Kitchen EPINEPHrine (ADRENALIN) 1 MG/ML injection Inject 1 mg as directed as needed for anaphylaxis. Reported on 02/01/2016    . ezetimibe (ZETIA) 10 MG tablet Take 1 tablet (10 mg total) by mouth daily. 30 tablet 6  . lidocaine-prilocaine (EMLA) cream Apply 1 application topically as needed. 30 g 6  . Lysine 500 MG CAPS Take 1 capsule by mouth 2 (two) times daily as needed (fever blisters).     . meclizine (ANTIVERT) 25 MG tablet Take 25 mg by mouth daily as needed for dizziness.     . Multiple Vitamins-Minerals (CENTRUM SILVER 50+WOMEN PO) Take 1 tablet by mouth daily.    . nitroGLYCERIN (NITROSTAT) 0.4 MG SL tablet Place 0.4 mg under the tongue every 5 (five) minutes as needed for chest pain. Reported on 02/01/2016    . Omega 3 1000 MG CAPS Take 1 each by mouth daily.     Marland Kitchen omeprazole (PRILOSEC) 40 MG capsule Take 40 mg by mouth daily.    . Prenatal Vit-Fe Fumarate-FA (PRENATAL VITAMIN PO) Take 1 tablet by mouth 2 (two) times daily.    Marland Kitchen  psyllium (METAMUCIL) 58.6 % powder Take 1 packet by mouth daily.    . rosuvastatin (CRESTOR) 10 MG tablet Take 10 mg by mouth daily.  3  . sotalol (BETAPACE) 160 MG tablet TAKE 1 TABLET (160 MG TOTAL) BY MOUTH 2 (TWO) TIMES DAILY. 60 tablet 10  . triamcinolone (NASACORT) 55 MCG/ACT AERO nasal inhaler Place 2 sprays into the nose 2 (two) times  daily. 1 Inhaler 12   No current facility-administered medications for this visit.    Facility-Administered Medications Ordered in Other Visits  Medication Dose Route Frequency Provider Last Rate Last Dose  . sodium chloride flush (NS) 0.9 % injection 10 mL  10 mL Intravenous PRN Alvy Bimler, Nikolai Wilczak, MD   10 mL at 09/22/17 1322     REVIEW OF SYSTEMS:   Constitutional: Denies fevers, chills or night sweats Eyes: Denies blurriness of vision Ears, nose, mouth, throat, and face: Denies mucositis or sore throat Respiratory: Denies cough, dyspnea or wheezes Cardiovascular: Denies palpitation, chest discomfort or lower extremity swelling Gastrointestinal:  Denies nausea, heartburn or change in bowel habits Skin: Denies abnormal skin rashes Lymphatics: Denies new lymphadenopathy or easy bruising Neurological:Denies numbness, tingling or new weaknesses Behavioral/Psych: Mood is stable, no new changes  All other systems were reviewed with the patient and are negative.  PHYSICAL EXAMINATION: ECOG PERFORMANCE STATUS: 1 - Symptomatic but completely ambulatory  Vitals:   07/12/18 0857  BP: (!) 140/48  Pulse: (!) 51  Resp: 18  Temp: 97.6 F (36.4 C)  SpO2: 100%   Filed Weights   07/12/18 0857  Weight: 133 lb 6.4 oz (60.5 kg)    GENERAL:alert, no distress and comfortable SKIN: skin color, texture, turgor are normal, no rashes or significant lesions EYES: normal, Conjunctiva are pink and non-injected, sclera clear OROPHARYNX:no exudate, no erythema and lips, buccal mucosa, and tongue normal  NECK: supple, thyroid normal size, non-tender, without nodularity LYMPH:  no palpable lymphadenopathy in the cervical, axillary or inguinal LUNGS: clear to auscultation and percussion with normal breathing effort HEART: regular rate & rhythm and no murmurs and no lower extremity edema ABDOMEN:abdomen soft, non-tender and normal bowel sounds Musculoskeletal:no cyanosis of digits and no clubbing  NEURO:  alert & oriented x 3 with fluent speech, no focal motor/sensory deficits  LABORATORY DATA:  I have reviewed the data as listed     Component Value Date/Time   NA 137 07/11/2017 0933   NA 142 05/05/2017 0850   K 4.3 07/11/2017 0933   K 4.0 05/05/2017 0850   CL 104 07/11/2017 0933   CL 104 08/02/2012 1515   CO2 24 07/11/2017 0933   CO2 25 05/05/2017 0850   GLUCOSE 110 (H) 07/11/2017 0933   GLUCOSE 130 05/05/2017 0850   GLUCOSE 105 (H) 08/02/2012 1515   BUN 17 07/11/2017 0933   BUN 18.9 05/05/2017 0850   CREATININE 0.97 07/11/2017 0933   CREATININE 0.9 05/05/2017 0850   CALCIUM 9.7 07/11/2017 0933   CALCIUM 10.0 05/05/2017 0850   PROT 6.9 05/05/2017 0850   ALBUMIN 3.8 05/05/2017 0850   AST 19 05/05/2017 0850   ALT 14 05/05/2017 0850   ALKPHOS 54 05/05/2017 0850   BILITOT 0.48 05/05/2017 0850   GFRNONAA 53 (L) 07/11/2017 0933   GFRAA >60 07/11/2017 0933    No results found for: SPEP, UPEP  Lab Results  Component Value Date   WBC 5.4 07/12/2018   NEUTROABS 3.0 07/12/2018   HGB 11.8 (L) 07/12/2018   HCT 35.5 (L) 07/12/2018   MCV 89.0  07/12/2018   PLT 68 (L) 07/12/2018      Chemistry      Component Value Date/Time   NA 137 07/11/2017 0933   NA 142 05/05/2017 0850   K 4.3 07/11/2017 0933   K 4.0 05/05/2017 0850   CL 104 07/11/2017 0933   CL 104 08/02/2012 1515   CO2 24 07/11/2017 0933   CO2 25 05/05/2017 0850   BUN 17 07/11/2017 0933   BUN 18.9 05/05/2017 0850   CREATININE 0.97 07/11/2017 0933   CREATININE 0.9 05/05/2017 0850      Component Value Date/Time   CALCIUM 9.7 07/11/2017 0933   CALCIUM 10.0 05/05/2017 0850   ALKPHOS 54 05/05/2017 0850   AST 19 05/05/2017 0850   ALT 14 05/05/2017 0850   BILITOT 0.48 05/05/2017 0850       I spent 10 minutes counseling the patient face to face. The total time spent in the appointment was 15 minutes and more than 50% was on counseling.   All questions were answered. The patient knows to call the clinic with any  problems, questions or concerns. No barriers to learning was detected.    Heath Lark, MD 10/25/20193:01 PM

## 2018-07-31 ENCOUNTER — Telehealth: Payer: Self-pay | Admitting: Hematology and Oncology

## 2018-07-31 NOTE — Telephone Encounter (Signed)
NG out of office 10/30 thru 12/2 - moved 11/21 f/u to [place]. Spoke with patient/spouse re change.

## 2018-08-01 DIAGNOSIS — D696 Thrombocytopenia, unspecified: Secondary | ICD-10-CM | POA: Diagnosis not present

## 2018-08-01 DIAGNOSIS — K625 Hemorrhage of anus and rectum: Secondary | ICD-10-CM | POA: Diagnosis not present

## 2018-08-03 ENCOUNTER — Other Ambulatory Visit: Payer: Self-pay | Admitting: Gastroenterology

## 2018-08-03 DIAGNOSIS — Z86010 Personal history of colonic polyps: Secondary | ICD-10-CM

## 2018-08-03 DIAGNOSIS — I69359 Hemiplegia and hemiparesis following cerebral infarction affecting unspecified side: Secondary | ICD-10-CM

## 2018-08-07 NOTE — Progress Notes (Signed)
Patient Care Team: [name], MD as PCP - General (Family Medicine) [name], MD as Attending Physician (Family Medicine) Belva Crome, MD as Attending Physician (Cardiology) Garlan Fair, MD (Gastroenterology)  DIAGNOSIS: Idiopathic thrombocytopenic purpura ([place])  CHIEF COMPLIANT: Follow up of Idiopathic thrombocytopenic purpura and monthly IVIG  INTERVAL HISTORY: [name] is a 84 y.o. with above-mentioned history of Idiopathic thrombocytopenic purpura and is currently being treated with monthly IVIG with goal to keep platelet count above 50. Her last dose of IVIG was 07/12/18. She is under the care of Dr. Alvy Bimler and I am following up with them in her absence.  The patient presents to the clinic today with her husband. She notes she has been doing well with treatment but notes a reaction to the second dose of treatment for which she took Tylenol. She denies chills and rashes. Platelets have improved to 80. Her husband notes significant bleeding, 2-3 episodes, in between treatments due to hemorrhoids and will determine necessity of surgery after an upcoming CT. She was suggested a twice daily probiotic after constant gas following meals, for which she takes Tums. She had a hematoma which affects her short term memory significantly, leading her husband to be the primary historian.    REVIEW OF SYSTEMS:  Constitutional: Denies fevers, chills or abnormal weight loss Eyes: Denies blurriness of vision Ears, nose, mouth, throat, and face: Denies mucositis or sore throat Respiratory: Denies cough, dyspnea or wheezes Cardiovascular: Denies palpitation, chest discomfort Gastrointestinal:  Denies nausea, heartburn or change in bowel habits (+) gas, after meals (+) hemorrhoids, with recurrent bleeding Skin: Denies abnormal skin rashes Lymphatics: Denies new lymphadenopathy or easy bruising Neurological:Denies numbness, tingling or new weaknesses Behavioral/Psych: Mood is  stable, no new changes (+) decreased short term memory Extremities: No lower extremity edema All other systems were reviewed with the patient and are negative.  I have reviewed the past medical history, past surgical history, social history and family history with the patient and they are unchanged from previous note.  ALLERGIES:  is allergic to bee venom; iodides; lyrica [pregabalin]; codeine; demerol [meperidine]; ambien [zolpidem tartrate]; aspirin; celebrex [celecoxib]; darvocet [propoxyphene n-acetaminophen]; fosamax [alendronate sodium]; lipitor [atorvastatin]; morphine and related; valium [diazepam]; and iodine.  MEDICATIONS:  Current Outpatient Medications  Medication Sig Dispense Refill  . amLODipine (NORVASC) 5 MG tablet Take 1 tablet (5 mg total) by mouth daily. 90 tablet 3  . calcium citrate (CALCITRATE - DOSED IN MG ELEMENTAL CALCIUM) 950 MG tablet Take 1 tablet by mouth daily.    . cetirizine (ZYRTEC) 10 MG tablet Take 10 mg by mouth daily.    . Cholecalciferol (VITAMIN D-3 PO) Take 1 tablet by mouth daily.    Marland Kitchen EPINEPHrine (ADRENALIN) 1 MG/ML injection Inject 1 mg as directed as needed for anaphylaxis. Reported on 02/01/2016    . ezetimibe (ZETIA) 10 MG tablet Take 1 tablet (10 mg total) by mouth daily. 30 tablet 6  . lidocaine-prilocaine (EMLA) cream Apply 1 application topically as needed. 30 g 6  . Lysine 500 MG CAPS Take 1 capsule by mouth 2 (two) times daily as needed (fever blisters).     . meclizine (ANTIVERT) 25 MG tablet Take 25 mg by mouth daily as needed for dizziness.     . Multiple Vitamins-Minerals (CENTRUM SILVER 50+WOMEN PO) Take 1 tablet by mouth daily.    . nitroGLYCERIN (NITROSTAT) 0.4 MG SL tablet Place 0.4 mg under the tongue every 5 (five) minutes as needed for chest pain. Reported  on 02/01/2016    . Omega 3 1000 MG CAPS Take 1 each by mouth daily.     Marland Kitchen omeprazole (PRILOSEC) 40 MG capsule Take 40 mg by mouth daily.    . Prenatal Vit-Fe Fumarate-FA (PRENATAL  VITAMIN PO) Take 1 tablet by mouth 2 (two) times daily.    . psyllium (METAMUCIL) 58.6 % powder Take 1 packet by mouth daily.    . rosuvastatin (CRESTOR) 10 MG tablet Take 10 mg by mouth daily.  3  . sotalol (BETAPACE) 160 MG tablet TAKE 1 TABLET (160 MG TOTAL) BY MOUTH 2 (TWO) TIMES DAILY. 60 tablet 10  . triamcinolone (NASACORT) 55 MCG/ACT AERO nasal inhaler Place 2 sprays into the nose 2 (two) times daily. 1 Inhaler 12   No current facility-administered medications for this visit.    Facility-Administered Medications Ordered in Other Visits  Medication Dose Route Frequency Provider Last Rate Last Dose  . sodium chloride flush (NS) 0.9 % injection 10 mL  10 mL Intravenous PRN Alvy Bimler, Ni, MD   10 mL at 09/22/17 1322    PHYSICAL EXAMINATION: ECOG PERFORMANCE STATUS: 1 - Symptomatic but completely ambulatory  Vitals:   08/09/18 0902  BP: (!) 142/65  Pulse: (!) 52  Resp: 16  Temp: (!) 97.5 F (36.4 C)  SpO2: 100%   Filed Weights   08/09/18 0902  Weight: 135 lb 8 oz (61.5 kg)    GENERAL:alert, no distress and comfortable SKIN: skin color, texture, turgor are normal, no rashes or significant lesions EYES: normal, Conjunctiva are pink and non-injected, sclera clear OROPHARYNX:no exudate, no erythema and lips, buccal mucosa, and tongue normal  NECK: supple, thyroid normal size, non-tender, without nodularity LYMPH:  no palpable lymphadenopathy in the cervical, axillary or inguinal LUNGS: clear to auscultation and percussion with normal breathing effort HEART: regular rate & rhythm and no murmurs and no lower extremity edema ABDOMEN:abdomen soft, non-tender and normal bowel sounds MUSCULOSKELETAL:no cyanosis of digits and no clubbing  NEURO: alert & oriented x 3 with fluent speech, no focal motor/sensory deficits EXTREMITIES: No lower extremity edema  LABORATORY DATA:  I have reviewed the data as listed CMP Latest Ref Rng & Units 07/11/2017 05/05/2017 09/29/2016  Glucose 65 - 99  mg/dL 110(H) 130 115(H)  BUN 6 - 20 mg/dL 17 18.9 20  Creatinine 0.44 - 1.00 mg/dL 0.97 0.9 0.94  Sodium 135 - 145 mmol/L 137 142 139  Potassium 3.5 - 5.1 mmol/L 4.3 4.0 4.6  Chloride 101 - 111 mmol/L 104 - 99  CO2 22 - 32 mmol/L 24 25 24   Calcium 8.9 - 10.3 mg/dL 9.7 10.0 9.6  Total Protein 6.4 - 8.3 g/dL - 6.9 7.1  Total Bilirubin 0.20 - 1.20 mg/dL - 0.48 0.3  Alkaline Phos 40 - 150 U/L - 54 65  AST 5 - 34 U/L - 19 22  ALT 0 - 55 U/L - 14 19    Lab Results  Component Value Date   WBC 6.2 08/09/2018   HGB 12.0 08/09/2018   HCT 36.3 08/09/2018   MCV 88.1 08/09/2018   PLT 80 (L) 08/09/2018   NEUTROABS 2.9 08/09/2018    ASSESSMENT & PLAN:  Idiopathic thrombocytopenic purpura ([facility]) recurrent ITP, with poor tolerance to steroid treatment, subsequently had near complete response to IVIG  Current treatment: Monthly IVIG IVIG side effects: No toxicities or adverse effects Goal is just to keep her platelet count above 50 With recurrent hemorrhoids: Patient is going to undergo virtual colonoscopy.  After that we  may consider doing surgery on the hemorrhoids. Severe memory loss: Due to prior intracranial bleed  Return to clinic monthly for IVIG treatments No orders of the defined types were placed in this encounter.  The patient has a good understanding of the overall plan. she agrees with it. she will call with any problems that may develop before the next visit here.   [name] 08/09/2018    I, [name], am acting as Education administrator for Nicholas Lose, MD.  I have reviewed the above documentation for accuracy and completeness, and I agree with the above.

## 2018-08-09 ENCOUNTER — Other Ambulatory Visit: Payer: Medicare HMO

## 2018-08-09 ENCOUNTER — Inpatient Hospital Stay: Payer: Medicare HMO

## 2018-08-09 ENCOUNTER — Telehealth: Payer: Self-pay | Admitting: Hematology and Oncology

## 2018-08-09 ENCOUNTER — Inpatient Hospital Stay: Payer: Medicare HMO | Attending: Hematology and Oncology

## 2018-08-09 ENCOUNTER — Ambulatory Visit: Payer: Medicare HMO | Admitting: Hematology and Oncology

## 2018-08-09 ENCOUNTER — Inpatient Hospital Stay (HOSPITAL_BASED_OUTPATIENT_CLINIC_OR_DEPARTMENT_OTHER): Payer: Medicare HMO | Admitting: Hematology and Oncology

## 2018-08-09 VITALS — BP 132/80 | HR 52 | Temp 98.1°F | Resp 18

## 2018-08-09 DIAGNOSIS — Z79899 Other long term (current) drug therapy: Secondary | ICD-10-CM | POA: Diagnosis not present

## 2018-08-09 DIAGNOSIS — D693 Immune thrombocytopenic purpura: Secondary | ICD-10-CM

## 2018-08-09 DIAGNOSIS — Z95828 Presence of other vascular implants and grafts: Secondary | ICD-10-CM

## 2018-08-09 LAB — CBC WITH DIFFERENTIAL/PLATELET
Abs Immature Granulocytes: 0.02 10*3/uL (ref 0.00–0.07)
Basophils Absolute: 0 10*3/uL (ref 0.0–0.1)
Basophils Relative: 1 %
EOS PCT: 1 %
Eosinophils Absolute: 0.1 10*3/uL (ref 0.0–0.5)
HEMATOCRIT: 36.3 % (ref 36.0–46.0)
Hemoglobin: 12 g/dL (ref 12.0–15.0)
Immature Granulocytes: 0 %
LYMPHS ABS: 2.5 10*3/uL (ref 0.7–4.0)
Lymphocytes Relative: 40 %
MCH: 29.1 pg (ref 26.0–34.0)
MCHC: 33.1 g/dL (ref 30.0–36.0)
MCV: 88.1 fL (ref 80.0–100.0)
MONO ABS: 0.6 10*3/uL (ref 0.1–1.0)
MONOS PCT: 10 %
Neutro Abs: 2.9 10*3/uL (ref 1.7–7.7)
Neutrophils Relative %: 48 %
Platelets: 80 10*3/uL — ABNORMAL LOW (ref 150–400)
RBC: 4.12 MIL/uL (ref 3.87–5.11)
RDW: 13.5 % (ref 11.5–15.5)
WBC: 6.2 10*3/uL (ref 4.0–10.5)
nRBC: 0 % (ref 0.0–0.2)

## 2018-08-09 MED ORDER — ACETAMINOPHEN 325 MG PO TABS
ORAL_TABLET | ORAL | Status: AC
  Filled 2018-08-09: qty 2

## 2018-08-09 MED ORDER — ACETAMINOPHEN 325 MG PO TABS
650.0000 mg | ORAL_TABLET | Freq: Once | ORAL | Status: AC
  Administered 2018-08-09: 650 mg via ORAL

## 2018-08-09 MED ORDER — IMMUNE GLOBULIN (HUMAN) 20 GM/200ML IV SOLN
55.0000 g | Freq: Once | INTRAVENOUS | Status: AC
  Administered 2018-08-09: 55 g via INTRAVENOUS
  Filled 2018-08-09: qty 50

## 2018-08-09 MED ORDER — FAMOTIDINE 20 MG PO TABS
20.0000 mg | ORAL_TABLET | Freq: Once | ORAL | Status: AC
  Administered 2018-08-09: 20 mg via ORAL

## 2018-08-09 MED ORDER — FAMOTIDINE 20 MG PO TABS
ORAL_TABLET | ORAL | Status: AC
  Filled 2018-08-09: qty 1

## 2018-08-09 MED ORDER — ALIGN PREBIOTIC-PROBIOTIC 5-1.25 MG-GM PO CHEW: 1.0000 | CHEWABLE_TABLET | Freq: Two times a day (BID) | ORAL | Status: DC

## 2018-08-09 MED ORDER — HEPARIN SOD (PORK) LOCK FLUSH 100 UNIT/ML IV SOLN
500.0000 [IU] | Freq: Once | INTRAVENOUS | Status: AC
  Administered 2018-08-09: 500 [IU]
  Filled 2018-08-09: qty 5

## 2018-08-09 MED ORDER — SODIUM CHLORIDE 0.9% FLUSH
10.0000 mL | Freq: Once | INTRAVENOUS | Status: AC
  Administered 2018-08-09: 10 mL
  Filled 2018-08-09: qty 10

## 2018-08-09 MED ORDER — DIPHENHYDRAMINE HCL 25 MG PO CAPS
ORAL_CAPSULE | ORAL | Status: AC
  Filled 2018-08-09: qty 1

## 2018-08-09 MED ORDER — DIPHENHYDRAMINE HCL 25 MG PO TABS
25.0000 mg | ORAL_TABLET | Freq: Once | ORAL | Status: AC
  Administered 2018-08-09: 25 mg via ORAL
  Filled 2018-08-09: qty 1

## 2018-08-09 MED ORDER — SODIUM CHLORIDE 0.9 % IV SOLN
INTRAVENOUS | Status: DC
  Administered 2018-08-09: 10:00:00 via INTRAVENOUS
  Filled 2018-08-09: qty 250

## 2018-08-09 NOTE — Telephone Encounter (Signed)
No 11/21 los or referrals.

## 2018-08-09 NOTE — Assessment & Plan Note (Signed)
recurrent ITP, with poor tolerance to steroid treatment, subsequently had near complete response to IVIG  Current treatment: Monthly IVIG Goal is just to keep her platelet count above 50

## 2018-08-09 NOTE — Progress Notes (Signed)
Vital signs obtained after Priv[employer] infused, she and husband refused to stay for 30 minute observation after completion of IVIG.

## 2018-08-09 NOTE — Patient Instructions (Signed)

## 2018-08-20 NOTE — Progress Notes (Signed)
Cardiology Office Note:    Date:  08/21/2018   ID:  [name], DOB July 31, 1933, MRN [id number]  PCP:  Adrienne Ada, MD  Cardiologist:  Adrienne Grooms, MD   Referring MD: Adrienne Ada, MD   Chief Complaint  Patient presents with  . Cardiac Valve Problem  . Hypertension  . Atrial Fibrillation    History of Present Illness:    [name] is a 84 y.o. female with a hx of HOCM, septal myectomy 2010, CABG 2010, intracranial hemorrhage with resultant memory loss, MV repair 2010, h/o PAF and "no anticoagulation due to prior intracranial hemorrhage".   She remembers who I am on today's visit.  She denies cardiac complaints.  Husband verifies this assertion.  She specifically has not had syncope, palpitations, edema, orthopnea, PND, or chest pain.  She does have history of thrombocytopenia and has previously had intracranial hemorrhage.  Not on anticoagulation therapy.  Occasional bright red blood per rectum due to hemorrhoids.  She has not had significant heart racing or pounding, transient neurological symptoms, or noticed any dramatic change in exertional tolerance.  She has not had any of her medications yet this morning.  They have facility to measure blood pressure at home.  They have not done this recently.  We discussed low-salt diet.  Past Medical History:  Diagnosis Date  . Bilateral hearing loss   . CAD (coronary artery disease) 2003   on Plavix.   . CVA (cerebral vascular accident) ([place]) 2010  . Fatigue   . Gross hematuria 07/03/2015  . Hemorrhagic stroke ([place]) 2010  . Hiatal hernia   . Hypercholesteremia   . ITP (idiopathic thrombocytopenic purpura) 01/04/2016  . Kidney stones   . Osteoporosis   . Pancreatitis 1970  . Pancreatitis   . Thrombocytopenia ([place])   . Unspecified deficiency anemia     Past Surgical History:  Procedure Laterality Date  . APPENDECTOMY    . CARDIAC VALVE SURGERY  2010  . CATARACT EXTRACTION  2017   x 2  .  COLONOSCOPY     [facility], Dr. Wynetta Emery  . COLONOSCOPY WITH PROPOFOL N/A 04/09/2013   Procedure: COLONOSCOPY WITH PROPOFOL;  Surgeon: Garlan Fair, MD;  Location: [facility];  Service: Endoscopy;  Laterality: N/A;  . CORONARY ARTERY BYPASS GRAFT  06/2009  . FLEXIBLE SIGMOIDOSCOPY N/A 08/03/2015   Procedure: FLEXIBLE SIGMOIDOSCOPY;  Surgeon: Garlan Fair, MD;  Location: [facility];  Service: Endoscopy;  Laterality: N/A;  . IR FLUORO GUIDE PORT INSERTION RIGHT  07/11/2017  . IR US GUIDE VASC ACCESS RIGHT  07/11/2017  . TUBAL LIGATION      Current Medications: Current Meds  Medication Sig  . amLODipine (NORVASC) 5 MG tablet Take 1 tablet (5 mg total) by mouth daily.  . Bacillus Coagulans-Inulin (ALIGN PREBIOTIC-PROBIOTIC) 5-1.25 MG-GM CHEW Chew 1 tablet by mouth 2 (two) times daily.  . calcium citrate (CALCITRATE - DOSED IN MG ELEMENTAL CALCIUM) 950 MG tablet Take 1 tablet by mouth daily.  . cetirizine (ZYRTEC) 10 MG tablet Take 10 mg by mouth daily.  . Cholecalciferol (VITAMIN D-3 PO) Take 1 tablet by mouth daily.  Marland Kitchen EPINEPHrine (ADRENALIN) 1 MG/ML injection Inject 1 mg as directed as needed for anaphylaxis. Reported on 02/01/2016  . ezetimibe (ZETIA) 10 MG tablet Take 1 tablet (10 mg total) by mouth daily.  Marland Kitchen lidocaine-prilocaine (EMLA) cream Apply 1 application topically as needed.  Marland Kitchen Lysine 500 MG CAPS Take 1 capsule by mouth 2 (two) times  daily as needed (fever blisters).   . meclizine (ANTIVERT) 25 MG tablet Take 25 mg by mouth daily as needed for dizziness.   . Multiple Vitamins-Minerals (CENTRUM SILVER 50+WOMEN PO) Take 1 tablet by mouth daily.  . nitroGLYCERIN (NITROSTAT) 0.4 MG SL tablet Place 0.4 mg under the tongue every 5 (five) minutes as needed for chest pain. Reported on 02/01/2016  . Omega 3 1000 MG CAPS Take 1 each by mouth daily.   Marland Kitchen omeprazole (PRILOSEC) 40 MG capsule Take 40 mg by mouth daily.  . Prenatal Vit-Fe Fumarate-FA (PRENATAL VITAMIN PO) Take 1 tablet by  mouth 2 (two) times daily.  . psyllium (METAMUCIL) 58.6 % powder Take 1 packet by mouth daily.  . rosuvastatin (CRESTOR) 10 MG tablet Take 10 mg by mouth daily.  . sotalol (BETAPACE) 160 MG tablet TAKE 1 TABLET (160 MG TOTAL) BY MOUTH 2 (TWO) TIMES DAILY.  Marland Kitchen triamcinolone (NASACORT) 55 MCG/ACT AERO nasal inhaler Place 2 sprays into the nose 2 (two) times daily.     Allergies:   Bee venom; Iodides; Lyrica [pregabalin]; Codeine; Demerol [meperidine]; Ambien [zolpidem tartrate]; Aspirin; Celebrex [celecoxib]; Darvocet [propoxyphene n-acetaminophen]; Fosamax [alendronate sodium]; Lipitor [atorvastatin]; Morphine and related; Valium [diazepam]; and Iodine   Social History   Socioeconomic History  . Marital status: Married    Spouse name: [name]  . Number of children: 2  . Years of education: College  . Highest education level: Not on file  Occupational History    Comment: retired Estate manager/land agent  Social Needs  . Financial resource strain: Not on file  . Food insecurity:    Worry: Not on file    Inability: Not on file  . Transportation needs:    Medical: Not on file    Non-medical: Not on file  Tobacco Use  . Smoking status: Never Smoker  . Smokeless tobacco: Never Used  Substance and Sexual Activity  . Alcohol use: No  . Drug use: No  . Sexual activity: Not on file  Lifestyle  . Physical activity:    Days per week: Not on file    Minutes per session: Not on file  . Stress: Not on file  Relationships  . Social connections:    Talks on phone: Not on file    Gets together: Not on file    Attends religious service: Not on file    Active member of club or organization: Not on file    Attends meetings of clubs or organizations: Not on file    Relationship status: Not on file  Other Topics Concern  . Not on file  Social History Narrative   Pt lives at home with spouse.   Caffeine Use: Rarely     Family History: The patient's family history includes Cancer in her brother and  sister; Cancer (age of onset: 36) in her mother; Dementia in her brother; Heart attack in her father; Memory loss in her sister; Stroke in her sister.  ROS:   Please see the history of present illness.    Memory is getting worse.  They do not add salt to the diet.  All other systems reviewed and are negative.  EKGs/Labs/Other Studies Reviewed:    The following studies were reviewed today: 24-hour Holter monitor: Performed 08/24/2017 Study Highlights     Normal sinus rhythm and sinus bradycardia. No heart rate greater than 85 bpm.  No symptoms reported  Rare PVCs.  No tackyarrhythmias OR pauses greater than 3 seconds.   Sinus rhythm and sinus  bradycardia raising concern for chronotropic incompetence.  HEART RATE EPISODES Minimum HR: 43 BPM at 4:35:15 AM Maximum HR: 82 BPM at 12:43:45 AM Average HR: 55 BPM      EKG:  EKG is performed on 08/21/2018, and demonstrates sinus bradycardia poor R wave progression V1 through V4, left anterior hemiblock, and when compared to prior tracings, from 08/23/2017 demonstrates no significant change.  Recent Labs: 08/09/2018: Hemoglobin 12.0; Platelets 80  Recent Lipid Panel    Component Value Date/Time   CHOL 125 09/29/2016 0915   TRIG 143 09/29/2016 0915   HDL 46 09/29/2016 0915   CHOLHDL 2.7 09/29/2016 0915   CHOLHDL 3.9 07/22/2009 0945   VLDL 16 07/22/2009 0945   LDLCALC 50 09/29/2016 0915    Physical Exam:    VS:  BP (!) 148/76   Pulse (!) 51   Ht 5\' 2"  (1.575 m)   Wt 136 lb (61.7 kg)   SpO2 99%   BMI 24.87 kg/m     Wt Readings from Last 3 Encounters:  08/21/18 136 lb (61.7 kg)  08/09/18 135 lb 8 oz (61.5 kg)  07/12/18 133 lb 6.4 oz (60.5 kg)     GEN: Healthy appearing.. No acute distress HEENT: Normal NECK: No JVD. LYMPHATICS: No lymphadenopathy CARDIAC: Bradycardia RRR.  Scratchy soft left lower sternal systolic murmur.  There is no gallop or edema. VASCULAR: Pulses demonstrate symmetrical radial and carotid 2+  pulsation., Bruits absent in the carotids. RESPIRATORY:  Clear to auscultation without rales, wheezing or rhonchi  ABDOMEN: Soft, non-tender, non-distended, No pulsatile mass, MUSCULOSKELETAL: No deformity  SKIN: Warm and dry NEUROLOGIC:  Alert and oriented x 3 PSYCHIATRIC:  Normal affect   ASSESSMENT:    1. Hypertrophic obstructive cardiomyopathy (HCC)   2. Paroxysmal atrial fibrillation ([place])   3. S/P mitral valve repair   4. Essential hypertension   5. Intracranial hemorrhage ([place])   6. Chronic ITP (idiopathic thrombocytopenia) (HCC)    PLAN:    In order of problems listed above:  1. Stable without obvious symptom and no significant murmur. 2. No clinical evidence of paroxysmal A. fib.  Holter monitor done 1 year ago demonstrates chronotropic incompetence with heart rates remaining less than 80 bpm and above 43 bpm.  No atrial fibrillation was identified.  Not on anticoagulation because of prior intracranial hemorrhage. 3. No significant mitral regurgitation is noted on exam. 4. Systolic blood pressure is elevated.  Amlodipine is not on board this morning.  Acceptable blood pressure less than 161 systolic and ideally 096/04 mmHg.  They encouraged to check the blood pressure from time to time and notify us if remaining above 540 systolic. 5. No new data 6. Receiving therapy once per month in terms of an infusion. 7. She is on ezetimibe and rosuvastatin with most recent LDL 61 in October. 8. No evidence of sotalol toxicity based upon EKG.  Overall education and awareness concerning primary/secondary risk prevention was discussed in detail: LDL less than 70, hemoglobin A1c less than 7, blood pressure target less than 130/80 mmHg, >150 minutes of moderate aerobic activity per week, avoidance of smoking, weight control (via diet and exercise), and continued surveillance/management of/for obstructive sleep apnea.  Greater than 50% of the time during this office visit was spent in  education, counseling, and coordination of care related to underlying disease process and testing as outlined.      Medication Adjustments/Labs and Tests Ordered: Current medicines are reviewed at length with the patient today.  Concerns regarding medicines are  outlined above.  No orders of the defined types were placed in this encounter.  No orders of the defined types were placed in this encounter.   There are no Patient Instructions on file for this visit.   Signed, Adrienne Grooms, MD  08/21/2018 8:26 AM    [facility]

## 2018-08-21 ENCOUNTER — Encounter: Payer: Self-pay | Admitting: Interventional Cardiology

## 2018-08-21 ENCOUNTER — Ambulatory Visit: Payer: Medicare HMO | Admitting: Interventional Cardiology

## 2018-08-21 VITALS — BP 148/76 | HR 51 | Ht 62.0 in | Wt 136.0 lb

## 2018-08-21 DIAGNOSIS — I421 Obstructive hypertrophic cardiomyopathy: Secondary | ICD-10-CM | POA: Diagnosis not present

## 2018-08-21 DIAGNOSIS — I480 Paroxysmal atrial fibrillation: Secondary | ICD-10-CM

## 2018-08-21 DIAGNOSIS — I629 Nontraumatic intracranial hemorrhage, unspecified: Secondary | ICD-10-CM | POA: Diagnosis not present

## 2018-08-21 DIAGNOSIS — E782 Mixed hyperlipidemia: Secondary | ICD-10-CM

## 2018-08-21 DIAGNOSIS — Z5181 Encounter for therapeutic drug level monitoring: Secondary | ICD-10-CM

## 2018-08-21 DIAGNOSIS — D693 Immune thrombocytopenic purpura: Secondary | ICD-10-CM

## 2018-08-21 DIAGNOSIS — I10 Essential (primary) hypertension: Secondary | ICD-10-CM | POA: Diagnosis not present

## 2018-08-21 DIAGNOSIS — Z9889 Other specified postprocedural states: Secondary | ICD-10-CM

## 2018-08-21 NOTE — Patient Instructions (Signed)
Medication Instructions:  Your physician recommends that you continue on your current medications as directed. Please refer to the Current Medication list given to you today.  If you need a refill on your cardiac medications before your next appointment, please call your pharmacy.   Lab work: None If you have labs (blood work) drawn today and your tests are completely normal, you will receive your results only by: Marland Kitchen MyChart Message (if you have MyChart) OR . A paper copy in the mail If you have any lab test that is abnormal or we need to change your treatment, we will call you to review the results.  Testing/Procedures: None  Follow-Up: At [facility], you and your health needs are our priority.  As part of our continuing mission to provide you with exceptional heart care, we have created designated Provider Care Teams.  These Care Teams include your primary Cardiologist (physician) and Advanced Practice Providers (APPs -  Physician Assistants and Nurse Practitioners) who all work together to provide you with the care you need, when you need it. You will need a follow up appointment in 1 year.  Please call our office 2 months in advance to schedule this appointment.  You may see Sinclair Grooms, MD or one of the following Advanced Practice Providers on your designated Care Team:   Truitt Merle, NP Cecilie Kicks, NP . Kathyrn Drown, NP  Any Other Special Instructions Will Be Listed Below (If Applicable).    Low-Sodium Eating Plan Sodium, which is an element that makes up salt, helps you maintain a healthy balance of fluids in your body. Too much sodium can increase your blood pressure and cause fluid and waste to be held in your body. Your health care provider or dietitian may recommend following this plan if you have high blood pressure (hypertension), kidney disease, liver disease, or heart failure. Eating less sodium can help lower your blood pressure, reduce swelling, and protect  your heart, liver, and kidneys. What are tips for following this plan? General guidelines  Most people on this plan should limit their sodium intake to 1,500-2,000 mg (milligrams) of sodium each day. Reading food labels  The Nutrition Facts label lists the amount of sodium in one serving of the food. If you eat more than one serving, you must multiply the listed amount of sodium by the number of servings.  Choose foods with less than 140 mg of sodium per serving.  Avoid foods with 300 mg of sodium or more per serving. Shopping  Look for lower-sodium products, often labeled as "low-sodium" or "no salt added."  Always check the sodium content even if foods are labeled as "unsalted" or "no salt added".  Buy fresh foods. ? Avoid canned foods and premade or frozen meals. ? Avoid canned, cured, or processed meats  Buy breads that have less than 80 mg of sodium per slice. Cooking  Eat more home-cooked food and less restaurant, buffet, and fast food.  Avoid adding salt when cooking. Use salt-free seasonings or herbs instead of table salt or sea salt. Check with your health care provider or pharmacist before using salt substitutes.  Cook with plant-based oils, such as canola, sunflower, or olive oil. Meal planning  When eating at a restaurant, ask that your food be prepared with less salt or no salt, if possible.  Avoid foods that contain MSG (monosodium glutamate). MSG is sometimes added to [place] food, bouillon, and some canned foods. What foods are recommended? The items listed may  not be a complete list. Talk with your dietitian about what dietary choices are best for you. Grains Low-sodium cereals, including oats, puffed wheat and rice, and shredded wheat. Low-sodium crackers. Unsalted rice. Unsalted pasta. Low-sodium bread. Whole-grain breads and whole-grain pasta. Vegetables Fresh or frozen vegetables. "No salt added" canned vegetables. "No salt added" tomato sauce and paste.  Low-sodium or reduced-sodium tomato and vegetable juice. Fruits Fresh, frozen, or canned fruit. Fruit juice. Meats and other protein foods Fresh or frozen (no salt added) meat, poultry, seafood, and fish. Low-sodium canned tuna and salmon. Unsalted nuts. Dried peas, beans, and lentils without added salt. Unsalted canned beans. Eggs. Unsalted nut butters. Dairy Milk. Soy milk. Cheese that is naturally low in sodium, such as ricotta cheese, fresh mozzarella, or Swiss cheese Low-sodium or reduced-sodium cheese. Cream cheese. Yogurt. Fats and oils Unsalted butter. Unsalted margarine with no trans fat. Vegetable oils such as canola or olive oils. Seasonings and other foods Fresh and dried herbs and spices. Salt-free seasonings. Low-sodium mustard and ketchup. Sodium-free salad dressing. Sodium-free light mayonnaise. Fresh or refrigerated horseradish. Lemon juice. Vinegar. Homemade, reduced-sodium, or low-sodium soups. Unsalted popcorn and pretzels. Low-salt or salt-free chips. What foods are not recommended? The items listed may not be a complete list. Talk with your dietitian about what dietary choices are best for you. Grains Instant hot cereals. Bread stuffing, pancake, and biscuit mixes. Croutons. Seasoned rice or pasta mixes. Noodle soup cups. Boxed or frozen macaroni and cheese. Regular salted crackers. Self-rising flour. Vegetables Sauerkraut, pickled vegetables, and relishes. Olives. [place] fries. Onion rings. Regular canned vegetables (not low-sodium or reduced-sodium). Regular canned tomato sauce and paste (not low-sodium or reduced-sodium). Regular tomato and vegetable juice (not low-sodium or reduced-sodium). Frozen vegetables in sauces. Meats and other protein foods Meat or fish that is salted, canned, smoked, spiced, or pickled. Bacon, ham, sausage, hotdogs, corned beef, chipped beef, packaged lunch meats, salt pork, jerky, pickled herring, anchovies, regular canned tuna, sardines, salted  nuts. Dairy Processed cheese and cheese spreads. Cheese curds. Blue cheese. Feta cheese. String cheese. Regular cottage cheese. Buttermilk. Canned milk. Fats and oils Salted butter. Regular margarine. Ghee. Bacon fat. Seasonings and other foods Onion salt, garlic salt, seasoned salt, table salt, and sea salt. Canned and packaged gravies. Worcestershire sauce. Tartar sauce. Barbecue sauce. Teriyaki sauce. Soy sauce, including reduced-sodium. Steak sauce. Fish sauce. Oyster sauce. Cocktail sauce. Horseradish that you find on the shelf. Regular ketchup and mustard. Meat flavorings and tenderizers. Bouillon cubes. Hot sauce and Tabasco sauce. Premade or packaged marinades. Premade or packaged taco seasonings. Relishes. Regular salad dressings. Salsa. Potato and tortilla chips. Corn chips and puffs. Salted popcorn and pretzels. Canned or dried soups. Pizza. Frozen entrees and pot pies. Summary  Eating less sodium can help lower your blood pressure, reduce swelling, and protect your heart, liver, and kidneys.  Most people on this plan should limit their sodium intake to 1,500-2,000 mg (milligrams) of sodium each day.  Canned, boxed, and frozen foods are high in sodium. Restaurant foods, fast foods, and pizza are also very high in sodium. You also get sodium by adding salt to food.  Try to cook at home, eat more fresh fruits and vegetables, and eat less fast food, canned, processed, or prepared foods. This information is not intended to replace advice given to you by your health care provider. Make sure you discuss any questions you have with your health care provider. Document Released: 02/25/2002 Document Revised: 08/29/2016 Document Reviewed: 08/29/2016 Elsevier Interactive Patient Education  2018 [employer]  

## 2018-08-23 DIAGNOSIS — L821 Other seborrheic keratosis: Secondary | ICD-10-CM | POA: Diagnosis not present

## 2018-08-23 DIAGNOSIS — D485 Neoplasm of uncertain behavior of skin: Secondary | ICD-10-CM | POA: Diagnosis not present

## 2018-08-30 ENCOUNTER — Ambulatory Visit: Payer: Medicare HMO | Admitting: Internal Medicine

## 2018-09-06 ENCOUNTER — Encounter: Payer: Self-pay | Admitting: Hematology and Oncology

## 2018-09-06 ENCOUNTER — Inpatient Hospital Stay: Payer: Medicare HMO

## 2018-09-06 ENCOUNTER — Inpatient Hospital Stay: Payer: Medicare HMO | Attending: Hematology and Oncology | Admitting: Hematology and Oncology

## 2018-09-06 VITALS — BP 149/62 | HR 48 | Temp 97.9°F | Resp 18

## 2018-09-06 DIAGNOSIS — D693 Immune thrombocytopenic purpura: Secondary | ICD-10-CM

## 2018-09-06 DIAGNOSIS — Z79899 Other long term (current) drug therapy: Secondary | ICD-10-CM | POA: Insufficient documentation

## 2018-09-06 DIAGNOSIS — Z95828 Presence of other vascular implants and grafts: Secondary | ICD-10-CM

## 2018-09-06 DIAGNOSIS — K649 Unspecified hemorrhoids: Secondary | ICD-10-CM | POA: Diagnosis not present

## 2018-09-06 DIAGNOSIS — I421 Obstructive hypertrophic cardiomyopathy: Secondary | ICD-10-CM | POA: Diagnosis not present

## 2018-09-06 LAB — CBC WITH DIFFERENTIAL/PLATELET
ABS IMMATURE GRANULOCYTES: 0.03 10*3/uL (ref 0.00–0.07)
Basophils Absolute: 0 10*3/uL (ref 0.0–0.1)
Basophils Relative: 0 %
Eosinophils Absolute: 0.1 10*3/uL (ref 0.0–0.5)
Eosinophils Relative: 1 %
HCT: 37.1 % (ref 36.0–46.0)
Hemoglobin: 12 g/dL (ref 12.0–15.0)
Immature Granulocytes: 0 %
Lymphocytes Relative: 31 %
Lymphs Abs: 2.2 10*3/uL (ref 0.7–4.0)
MCH: 28.6 pg (ref 26.0–34.0)
MCHC: 32.3 g/dL (ref 30.0–36.0)
MCV: 88.3 fL (ref 80.0–100.0)
Monocytes Absolute: 0.8 10*3/uL (ref 0.1–1.0)
Monocytes Relative: 11 %
Neutro Abs: 4.2 10*3/uL (ref 1.7–7.7)
Neutrophils Relative %: 57 %
Platelets: 83 10*3/uL — ABNORMAL LOW (ref 150–400)
RBC: 4.2 MIL/uL (ref 3.87–5.11)
RDW: 13.8 % (ref 11.5–15.5)
WBC: 7.3 10*3/uL (ref 4.0–10.5)
nRBC: 0 % (ref 0.0–0.2)

## 2018-09-06 MED ORDER — IMMUNE GLOBULIN (HUMAN) 20 GM/200ML IV SOLN
0.9000 g/kg | Freq: Once | INTRAVENOUS | Status: DC
  Filled 2018-09-06: qty 550

## 2018-09-06 MED ORDER — ACETAMINOPHEN 325 MG PO TABS
650.0000 mg | ORAL_TABLET | Freq: Once | ORAL | Status: AC
  Administered 2018-09-06: 650 mg via ORAL

## 2018-09-06 MED ORDER — DIPHENHYDRAMINE HCL 25 MG PO TABS
25.0000 mg | ORAL_TABLET | Freq: Once | ORAL | Status: AC
  Administered 2018-09-06: 25 mg via ORAL
  Filled 2018-09-06: qty 1

## 2018-09-06 MED ORDER — ACETAMINOPHEN 325 MG PO TABS
ORAL_TABLET | ORAL | Status: AC
  Filled 2018-09-06: qty 2

## 2018-09-06 MED ORDER — DEXTROSE 5 % IV SOLN
INTRAVENOUS | Status: DC
  Administered 2018-09-06: 09:00:00 via INTRAVENOUS
  Filled 2018-09-06: qty 250

## 2018-09-06 MED ORDER — SODIUM CHLORIDE 0.9% FLUSH
10.0000 mL | Freq: Once | INTRAVENOUS | Status: AC
  Administered 2018-09-06: 10 mL via INTRAVENOUS
  Filled 2018-09-06: qty 10

## 2018-09-06 MED ORDER — SODIUM CHLORIDE 0.9% FLUSH
10.0000 mL | Freq: Once | INTRAVENOUS | Status: AC
  Administered 2018-09-06: 10 mL
  Filled 2018-09-06: qty 10

## 2018-09-06 MED ORDER — IMMUNE GLOBULIN (HUMAN) 20 GM/200ML IV SOLN
50.0000 g | Freq: Once | INTRAVENOUS | Status: AC
  Administered 2018-09-06: 50 g via INTRAVENOUS
  Filled 2018-09-06: qty 100

## 2018-09-06 MED ORDER — HEPARIN SOD (PORK) LOCK FLUSH 100 UNIT/ML IV SOLN
500.0000 [IU] | Freq: Once | INTRAVENOUS | Status: AC
  Administered 2018-09-06: 500 [IU] via INTRAVENOUS
  Filled 2018-09-06: qty 5

## 2018-09-06 MED ORDER — FAMOTIDINE 20 MG PO TABS
ORAL_TABLET | ORAL | Status: AC
  Filled 2018-09-06: qty 1

## 2018-09-06 MED ORDER — FAMOTIDINE 20 MG PO TABS
20.0000 mg | ORAL_TABLET | Freq: Once | ORAL | Status: AC
  Administered 2018-09-06: 20 mg via ORAL

## 2018-09-06 MED ORDER — DIPHENHYDRAMINE HCL 25 MG PO CAPS
ORAL_CAPSULE | ORAL | Status: AC
  Filled 2018-09-06: qty 1

## 2018-09-06 NOTE — Assessment & Plan Note (Signed)
She has recent evaluation and follow-up at cardiologist office with no changes on her medical management.  Her blood pressure is mildly elevated today but at home, her blood pressure control has been satisfactory.

## 2018-09-06 NOTE — Assessment & Plan Note (Signed)
She has intermittent bleeding hemorrhoid, self-limiting. She was seen by GI service recently with plan for virtual colonoscopy for evaluation Currently, she is still taking high fiber intake

## 2018-09-06 NOTE — Patient Instructions (Signed)

## 2018-09-06 NOTE — Progress Notes (Signed)
[facility] OFFICE PROGRESS NOTE  [name], MD  ASSESSMENT & PLAN:  Idiopathic thrombocytopenic purpura ([place]) The patient has recurrent ITP, with poor tolerance to steroid treatment, subsequently had near complete response to IVIG For now, the plan would be monthly IVIG She does not have serum sickness Her platelet count looks a little reduced but overall stable Goal is just to keep her platelet count above 50   Bleeding hemorrhoid She has intermittent bleeding hemorrhoid, self-limiting. She was seen by GI service recently with plan for virtual colonoscopy for evaluation Currently, she is still taking high fiber intake  Hypertrophic obstructive cardiomyopathy ([place]) She has recent evaluation and follow-up at cardiologist office with no changes on her medical management.  Her blood pressure is mildly elevated today but at home, her blood pressure control has been satisfactory.   No orders of the defined types were placed in this encounter.   INTERVAL HISTORY: [name] 84 y.o. female returns for further follow-up. She continues to have intermittent bleeding hemorrhoid.  She has seen GI service recently and awaiting CT imaging She continues to bleed on average twice a week, self-limiting She had recent cardiology service visit with no changes on her medication No recent infection, fever or chills Denies recent infusion reactions   SUMMARY OF HEMATOLOGIC HISTORY:  This is a pleasant lady with chronic thrombocytopenia. The patient was discovered to have low platelet count when she was placed on Plavix for coronary artery disease. 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 She is being observed. On 01/04/2016, she was started on prednisone therapy for ITP when her platelet count dip  under  50,000 May 2017, she tolerated prednisone poorly and prednisone is slowly tapered off 05/05/2017, she had relapsed ITP and started on IVIG with complete response to Rx but with platelet count trending down after 4 weeks. She is then scheduled for IVIG every 3-4 weeks I have reviewed the past medical history, past surgical history, social history and family history with the patient and they are unchanged from previous note.  ALLERGIES:  is allergic to bee venom; iodides; lyrica [pregabalin]; codeine; demerol [meperidine]; ambien [zolpidem tartrate]; aspirin; celebrex [celecoxib]; darvocet [propoxyphene n-acetaminophen]; fosamax [alendronate sodium]; lipitor [atorvastatin]; morphine and related; valium [diazepam]; and iodine.  MEDICATIONS:  Current Outpatient Medications  Medication Sig Dispense Refill  . amLODipine (NORVASC) 5 MG tablet Take 1 tablet (5 mg total) by mouth daily. 90 tablet 3  . Bacillus Coagulans-Inulin (ALIGN PREBIOTIC-PROBIOTIC) 5-1.25 MG-GM CHEW Chew 1 tablet by mouth 2 (two) times daily.    . calcium citrate (CALCITRATE - DOSED IN MG ELEMENTAL CALCIUM) 950 MG tablet Take 1 tablet by mouth daily.    . cetirizine (ZYRTEC) 10 MG tablet Take 10 mg by mouth daily.    . Cholecalciferol (VITAMIN D-3 PO) Take 1 tablet by mouth daily.    Marland Kitchen EPINEPHrine (ADRENALIN) 1 MG/ML injection Inject 1 mg as directed as needed for anaphylaxis. Reported on 02/01/2016    . ezetimibe (ZETIA) 10 MG tablet Take 1 tablet (10 mg total) by mouth daily. 30 tablet 6  . lidocaine-prilocaine (EMLA) cream Apply 1 application topically as needed. 30 g 6  . Lysine 500 MG CAPS Take 1 capsule by mouth 2 (two) times daily as needed (fever blisters).     . meclizine (ANTIVERT) 25 MG tablet Take 25 mg  by mouth daily as needed for dizziness.     . Multiple Vitamins-Minerals (CENTRUM SILVER 50+WOMEN PO) Take 1 tablet by mouth daily.    . nitroGLYCERIN (NITROSTAT) 0.4 MG SL tablet Place 0.4 mg under the tongue every  5 (five) minutes as needed for chest pain. Reported on 02/01/2016    . Omega 3 1000 MG CAPS Take 1 each by mouth daily.     Marland Kitchen omeprazole (PRILOSEC) 40 MG capsule Take 40 mg by mouth daily.    . Prenatal Vit-Fe Fumarate-FA (PRENATAL VITAMIN PO) Take 1 tablet by mouth 2 (two) times daily.    . psyllium (METAMUCIL) 58.6 % powder Take 1 packet by mouth daily.    . rosuvastatin (CRESTOR) 10 MG tablet Take 10 mg by mouth daily.  3  . sotalol (BETAPACE) 160 MG tablet TAKE 1 TABLET (160 MG TOTAL) BY MOUTH 2 (TWO) TIMES DAILY. 60 tablet 10  . triamcinolone (NASACORT) 55 MCG/ACT AERO nasal inhaler Place 2 sprays into the nose 2 (two) times daily. 1 Inhaler 12   No current facility-administered medications for this visit.    Facility-Administered Medications Ordered in Other Visits  Medication Dose Route Frequency Provider Last Rate Last Dose  . sodium chloride flush (NS) 0.9 % injection 10 mL  10 mL Intravenous PRN Alvy Bimler, Annelyse Rey, MD   10 mL at 09/22/17 1322     REVIEW OF SYSTEMS:   Constitutional: Denies fevers, chills or night sweats Eyes: Denies blurriness of vision Ears, nose, mouth, throat, and face: Denies mucositis or sore throat Respiratory: Denies cough, dyspnea or wheezes Cardiovascular: Denies palpitation, chest discomfort or lower extremity swelling Gastrointestinal:  Denies nausea, heartburn or change in bowel habits Skin: Denies abnormal skin rashes Lymphatics: Denies new lymphadenopathy or easy bruising Neurological:Denies numbness, tingling or new weaknesses Behavioral/Psych: Mood is stable, no new changes  All other systems were reviewed with the patient and are negative.  PHYSICAL EXAMINATION: ECOG PERFORMANCE STATUS: 1 - Symptomatic but completely ambulatory  Vitals:   09/06/18 0847  BP: (!) 152/61  Pulse: (!) 59  Resp: 17  Temp: (!) 97.5 F (36.4 C)  SpO2: 100%   Filed Weights   09/06/18 0847  Weight: 134 lb 6.4 oz (61 kg)    GENERAL:alert, no distress and  comfortable SKIN: skin color, texture, turgor are normal, no rashes or significant lesions EYES: normal, Conjunctiva are pink and non-injected, sclera clear OROPHARYNX:no exudate, no erythema and lips, buccal mucosa, and tongue normal  NECK: supple, thyroid normal size, non-tender, without nodularity LYMPH:  no palpable lymphadenopathy in the cervical, axillary or inguinal LUNGS: clear to auscultation and percussion with normal breathing effort HEART: regular rate & rhythm and no murmurs and no lower extremity edema ABDOMEN:abdomen soft, non-tender and normal bowel sounds Musculoskeletal:no cyanosis of digits and no clubbing  NEURO: alert & oriented x 3 with fluent speech, no focal motor/sensory deficits  LABORATORY DATA:  I have reviewed the data as listed     Component Value Date/Time   NA 137 07/11/2017 0933   NA 142 05/05/2017 0850   K 4.3 07/11/2017 0933   K 4.0 05/05/2017 0850   CL 104 07/11/2017 0933   CL 104 08/02/2012 1515   CO2 24 07/11/2017 0933   CO2 25 05/05/2017 0850   GLUCOSE 110 (H) 07/11/2017 0933   GLUCOSE 130 05/05/2017 0850   GLUCOSE 105 (H) 08/02/2012 1515   BUN 17 07/11/2017 0933   BUN 18.9 05/05/2017 0850   CREATININE 0.97 07/11/2017 0933  CREATININE 0.9 05/05/2017 0850   CALCIUM 9.7 07/11/2017 0933   CALCIUM 10.0 05/05/2017 0850   PROT 6.9 05/05/2017 0850   ALBUMIN 3.8 05/05/2017 0850   AST 19 05/05/2017 0850   ALT 14 05/05/2017 0850   ALKPHOS 54 05/05/2017 0850   BILITOT 0.48 05/05/2017 0850   GFRNONAA 53 (L) 07/11/2017 0933   GFRAA >60 07/11/2017 0933    No results found for: SPEP, UPEP  Lab Results  Component Value Date   WBC 7.3 09/06/2018   NEUTROABS 4.2 09/06/2018   HGB 12.0 09/06/2018   HCT 37.1 09/06/2018   MCV 88.3 09/06/2018   PLT 83 (L) 09/06/2018      Chemistry      Component Value Date/Time   NA 137 07/11/2017 0933   NA 142 05/05/2017 0850   K 4.3 07/11/2017 0933   K 4.0 05/05/2017 0850   CL 104 07/11/2017 0933   CL  104 08/02/2012 1515   CO2 24 07/11/2017 0933   CO2 25 05/05/2017 0850   BUN 17 07/11/2017 0933   BUN 18.9 05/05/2017 0850   CREATININE 0.97 07/11/2017 0933   CREATININE 0.9 05/05/2017 0850      Component Value Date/Time   CALCIUM 9.7 07/11/2017 0933   CALCIUM 10.0 05/05/2017 0850   ALKPHOS 54 05/05/2017 0850   AST 19 05/05/2017 0850   ALT 14 05/05/2017 0850   BILITOT 0.48 05/05/2017 0850       I spent 15 minutes counseling the patient face to face. The total time spent in the appointment was 20 minutes and more than 50% was on counseling.   All questions were answered. The patient knows to call the clinic with any problems, questions or concerns. No barriers to learning was detected.    Heath Lark, MD 12/19/20199:05 AM

## 2018-09-06 NOTE — Progress Notes (Signed)
Pt refused 30 min wait post IVIG. VSS at discharge.

## 2018-09-06 NOTE — Assessment & Plan Note (Signed)
The patient has recurrent ITP, with poor tolerance to steroid treatment, subsequently had near complete response to IVIG For now, the plan would be monthly IVIG She does not have serum sickness Her platelet count looks a little reduced but overall stable Goal is just to keep her platelet count above 50

## 2018-10-05 ENCOUNTER — Ambulatory Visit: Payer: Medicare HMO | Admitting: Hematology and Oncology

## 2018-10-05 ENCOUNTER — Encounter: Payer: Self-pay | Admitting: Hematology and Oncology

## 2018-10-05 ENCOUNTER — Inpatient Hospital Stay: Payer: Medicare HMO

## 2018-10-05 ENCOUNTER — Telehealth: Payer: Self-pay | Admitting: Hematology and Oncology

## 2018-10-05 ENCOUNTER — Inpatient Hospital Stay (HOSPITAL_BASED_OUTPATIENT_CLINIC_OR_DEPARTMENT_OTHER): Payer: Medicare HMO | Admitting: Hematology and Oncology

## 2018-10-05 ENCOUNTER — Inpatient Hospital Stay: Payer: Medicare HMO | Attending: Hematology and Oncology

## 2018-10-05 ENCOUNTER — Other Ambulatory Visit: Payer: Medicare HMO

## 2018-10-05 VITALS — BP 155/63 | HR 47 | Temp 97.7°F | Resp 16

## 2018-10-05 DIAGNOSIS — R413 Other amnesia: Secondary | ICD-10-CM

## 2018-10-05 DIAGNOSIS — Z95828 Presence of other vascular implants and grafts: Secondary | ICD-10-CM

## 2018-10-05 DIAGNOSIS — D693 Immune thrombocytopenic purpura: Secondary | ICD-10-CM

## 2018-10-05 DIAGNOSIS — K649 Unspecified hemorrhoids: Secondary | ICD-10-CM | POA: Diagnosis not present

## 2018-10-05 DIAGNOSIS — R69 Illness, unspecified: Secondary | ICD-10-CM | POA: Diagnosis not present

## 2018-10-05 DIAGNOSIS — F419 Anxiety disorder, unspecified: Secondary | ICD-10-CM | POA: Diagnosis not present

## 2018-10-05 LAB — CBC WITH DIFFERENTIAL/PLATELET
ABS IMMATURE GRANULOCYTES: 0.02 10*3/uL (ref 0.00–0.07)
Basophils Absolute: 0 10*3/uL (ref 0.0–0.1)
Basophils Relative: 0 %
Eosinophils Absolute: 0.1 10*3/uL (ref 0.0–0.5)
Eosinophils Relative: 1 %
HCT: 38 % (ref 36.0–46.0)
HEMOGLOBIN: 12.8 g/dL (ref 12.0–15.0)
IMMATURE GRANULOCYTES: 0 %
Lymphocytes Relative: 37 %
Lymphs Abs: 2.4 10*3/uL (ref 0.7–4.0)
MCH: 29.5 pg (ref 26.0–34.0)
MCHC: 33.7 g/dL (ref 30.0–36.0)
MCV: 87.6 fL (ref 80.0–100.0)
Monocytes Absolute: 0.6 10*3/uL (ref 0.1–1.0)
Monocytes Relative: 10 %
NEUTROS ABS: 3.3 10*3/uL (ref 1.7–7.7)
Neutrophils Relative %: 52 %
Platelets: 78 10*3/uL — ABNORMAL LOW (ref 150–400)
RBC: 4.34 MIL/uL (ref 3.87–5.11)
RDW: 13.7 % (ref 11.5–15.5)
WBC: 6.4 10*3/uL (ref 4.0–10.5)
nRBC: 0 % (ref 0.0–0.2)

## 2018-10-05 MED ORDER — FAMOTIDINE 20 MG PO TABS
ORAL_TABLET | ORAL | Status: AC
  Filled 2018-10-05: qty 1

## 2018-10-05 MED ORDER — ACETAMINOPHEN 325 MG PO TABS
ORAL_TABLET | ORAL | Status: AC
  Filled 2018-10-05: qty 2

## 2018-10-05 MED ORDER — DIPHENHYDRAMINE HCL 25 MG PO TABS
25.0000 mg | ORAL_TABLET | Freq: Once | ORAL | Status: AC
  Administered 2018-10-05: 25 mg via ORAL
  Filled 2018-10-05: qty 1

## 2018-10-05 MED ORDER — ACETAMINOPHEN 325 MG PO TABS
650.0000 mg | ORAL_TABLET | Freq: Once | ORAL | Status: AC
  Administered 2018-10-05: 650 mg via ORAL

## 2018-10-05 MED ORDER — DIPHENHYDRAMINE HCL 25 MG PO CAPS
ORAL_CAPSULE | ORAL | Status: AC
  Filled 2018-10-05: qty 1

## 2018-10-05 MED ORDER — FAMOTIDINE 20 MG PO TABS
20.0000 mg | ORAL_TABLET | Freq: Once | ORAL | Status: AC
  Administered 2018-10-05: 20 mg via ORAL

## 2018-10-05 MED ORDER — HEPARIN SOD (PORK) LOCK FLUSH 100 UNIT/ML IV SOLN
500.0000 [IU] | Freq: Once | INTRAVENOUS | Status: AC
  Administered 2018-10-05: 500 [IU]
  Filled 2018-10-05: qty 5

## 2018-10-05 MED ORDER — SODIUM CHLORIDE 0.9% FLUSH
10.0000 mL | Freq: Once | INTRAVENOUS | Status: AC
  Administered 2018-10-05: 10 mL
  Filled 2018-10-05: qty 10

## 2018-10-05 MED ORDER — IMMUNE GLOBULIN (HUMAN) 20 GM/200ML IV SOLN
55.0000 g | Freq: Once | INTRAVENOUS | Status: AC
  Administered 2018-10-05: 55 g via INTRAVENOUS
  Filled 2018-10-05: qty 400

## 2018-10-05 NOTE — Assessment & Plan Note (Signed)
She has intermittent bleeding hemorrhoid, self-limiting. She was seen by GI service recently  Currently, she is still taking high fiber intake

## 2018-10-05 NOTE — Assessment & Plan Note (Signed)
She has poor memory and anxiety The patient was visibly upset with her experience with port access earlier this morning. I recommend her husband to follow along to all her appointments in the future

## 2018-10-05 NOTE — Progress Notes (Signed)
[place] OFFICE PROGRESS NOTE  Adrienne Ada, MD  ASSESSMENT & PLAN:  Idiopathic thrombocytopenic purpura ([place]) The patient has recurrent ITP, with poor tolerance to steroid treatment, subsequently had near complete response to IVIG For now, the plan would be monthly IVIG She does not have serum sickness Her platelet count looks a little reduced but overall stable Goal is just to keep her platelet count above 50   Bleeding hemorrhoid She has intermittent bleeding hemorrhoid, self-limiting. She was seen by GI service recently  Currently, she is still taking high fiber intake  Poor memory She has poor memory and anxiety The patient was visibly upset with her experience with port access earlier this morning. I recommend her husband to follow along to all her appointments in the future   No orders of the defined types were placed in this encounter.   INTERVAL HISTORY: [name] 85 y.o. female returns for further IVIG treatment She was upset this morning because her husband was not present with her during the port access Her symptoms are about the same She has intermittent bleeding hemorrhoid No other forms of bleeding.  No serum sickness from treatment   SUMMARY OF HEMATOLOGIC HISTORY:  This is a pleasant lady with chronic thrombocytopenia. The patient was discovered to have low platelet count when she was placed on Plavix for coronary artery disease. 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 She is being observed. On 01/04/2016, she was started on prednisone therapy for ITP when her platelet count dip under  50,000 May 2017, she tolerated prednisone poorly and prednisone is slowly tapered off 05/05/2017, she had relapsed ITP and started on IVIG with complete response to Rx but with  platelet count trending down after 4 weeks. She is then scheduled for IVIG every 3-4 weeks  I have reviewed the past medical history, past surgical history, social history and family history with the patient and they are unchanged from previous note.  ALLERGIES:  is allergic to bee venom; iodides; lyrica [pregabalin]; codeine; demerol [meperidine]; ambien [zolpidem tartrate]; aspirin; celebrex [celecoxib]; darvocet [propoxyphene n-acetaminophen]; fosamax [alendronate sodium]; lipitor [atorvastatin]; morphine and related; valium [diazepam]; and iodine.  MEDICATIONS:  Current Outpatient Medications  Medication Sig Dispense Refill  . amLODipine (NORVASC) 5 MG tablet Take 1 tablet (5 mg total) by mouth daily. 90 tablet 3  . Bacillus Coagulans-Inulin (ALIGN PREBIOTIC-PROBIOTIC) 5-1.25 MG-GM CHEW Chew 1 tablet by mouth 2 (two) times daily.    . calcium citrate (CALCITRATE - DOSED IN MG ELEMENTAL CALCIUM) 950 MG tablet Take 1 tablet by mouth daily.    . cetirizine (ZYRTEC) 10 MG tablet Take 10 mg by mouth daily.    . Cholecalciferol (VITAMIN D-3 PO) Take 1 tablet by mouth daily.    Marland Kitchen EPINEPHrine (ADRENALIN) 1 MG/ML injection Inject 1 mg as directed as needed for anaphylaxis. Reported on 02/01/2016    . ezetimibe (ZETIA) 10 MG tablet Take 1 tablet (10 mg total) by mouth daily. 30 tablet 6  . lidocaine-prilocaine (EMLA) cream Apply 1 application topically as needed. 30 g 6  . Lysine 500 MG CAPS Take 1 capsule by mouth 2 (two) times daily as needed (fever blisters).     . meclizine (ANTIVERT) 25 MG tablet Take 25 mg by mouth daily as needed for dizziness.     . Multiple Vitamins-Minerals (CENTRUM SILVER 50+WOMEN  PO) Take 1 tablet by mouth daily.    . nitroGLYCERIN (NITROSTAT) 0.4 MG SL tablet Place 0.4 mg under the tongue every 5 (five) minutes as needed for chest pain. Reported on 02/01/2016    . Omega 3 1000 MG CAPS Take 1 each by mouth daily.     Marland Kitchen omeprazole (PRILOSEC) 40 MG capsule Take 40 mg by mouth  daily.    . Prenatal Vit-Fe Fumarate-FA (PRENATAL VITAMIN PO) Take 1 tablet by mouth 2 (two) times daily.    . psyllium (METAMUCIL) 58.6 % powder Take 1 packet by mouth daily.    . rosuvastatin (CRESTOR) 10 MG tablet Take 10 mg by mouth daily.  3  . sotalol (BETAPACE) 160 MG tablet TAKE 1 TABLET (160 MG TOTAL) BY MOUTH 2 (TWO) TIMES DAILY. 60 tablet 10  . triamcinolone (NASACORT) 55 MCG/ACT AERO nasal inhaler Place 2 sprays into the nose 2 (two) times daily. 1 Inhaler 12   No current facility-administered medications for this visit.    Facility-Administered Medications Ordered in Other Visits  Medication Dose Route Frequency Provider Last Rate Last Dose  . sodium chloride flush (NS) 0.9 % injection 10 mL  10 mL Intravenous PRN Alvy Bimler, Shakita Keir, MD   10 mL at 09/22/17 1322     REVIEW OF SYSTEMS:   Constitutional: Denies fevers, chills or night sweats Eyes: Denies blurriness of vision Ears, nose, mouth, throat, and face: Denies mucositis or sore throat Respiratory: Denies cough, dyspnea or wheezes Cardiovascular: Denies palpitation, chest discomfort or lower extremity swelling Gastrointestinal:  Denies nausea, heartburn or change in bowel habits Skin: Denies abnormal skin rashes Lymphatics: Denies new lymphadenopathy or easy bruising Neurological:Denies numbness, tingling or new weaknesses Behavioral/Psych: Mood is stable, no new changes  All other systems were reviewed with the patient and are negative.  PHYSICAL EXAMINATION: ECOG PERFORMANCE STATUS: 1 - Symptomatic but completely ambulatory  Vitals:   10/05/18 0917  BP: (!) 145/60  Pulse: (!) 56  Resp: 17  SpO2: 100%   Filed Weights   10/05/18 0917  Weight: 137 lb (62.1 kg)    GENERAL:alert, no distress and comfortable SKIN: skin color, texture, turgor are normal, no rashes or significant lesions EYES: normal, Conjunctiva are pink and non-injected, sclera clear OROPHARYNX:no exudate, no erythema and lips, buccal mucosa, and  tongue normal  NECK: supple, thyroid normal size, non-tender, without nodularity LYMPH:  no palpable lymphadenopathy in the cervical, axillary or inguinal LUNGS: clear to auscultation and percussion with normal breathing effort HEART: regular rate & rhythm, with mild systolic murmur and no lower extremity edema ABDOMEN:abdomen soft, non-tender and normal bowel sounds Musculoskeletal:no cyanosis of digits and no clubbing  NEURO: alert & oriented x 3 with fluent speech, no focal motor/sensory deficits  LABORATORY DATA:  I have reviewed the data as listed     Component Value Date/Time   NA 137 07/11/2017 0933   NA 142 05/05/2017 0850   K 4.3 07/11/2017 0933   K 4.0 05/05/2017 0850   CL 104 07/11/2017 0933   CL 104 08/02/2012 1515   CO2 24 07/11/2017 0933   CO2 25 05/05/2017 0850   GLUCOSE 110 (H) 07/11/2017 0933   GLUCOSE 130 05/05/2017 0850   GLUCOSE 105 (H) 08/02/2012 1515   BUN 17 07/11/2017 0933   BUN 18.9 05/05/2017 0850   CREATININE 0.97 07/11/2017 0933   CREATININE 0.9 05/05/2017 0850   CALCIUM 9.7 07/11/2017 0933   CALCIUM 10.0 05/05/2017 0850   PROT 6.9 05/05/2017 0850   ALBUMIN  3.8 05/05/2017 0850   AST 19 05/05/2017 0850   ALT 14 05/05/2017 0850   ALKPHOS 54 05/05/2017 0850   BILITOT 0.48 05/05/2017 0850   GFRNONAA 53 (L) 07/11/2017 0933   GFRAA >60 07/11/2017 0933    No results found for: SPEP, UPEP  Lab Results  Component Value Date   WBC 6.4 10/05/2018   NEUTROABS 3.3 10/05/2018   HGB 12.8 10/05/2018   HCT 38.0 10/05/2018   MCV 87.6 10/05/2018   PLT 78 (L) 10/05/2018      Chemistry      Component Value Date/Time   NA 137 07/11/2017 0933   NA 142 05/05/2017 0850   K 4.3 07/11/2017 0933   K 4.0 05/05/2017 0850   CL 104 07/11/2017 0933   CL 104 08/02/2012 1515   CO2 24 07/11/2017 0933   CO2 25 05/05/2017 0850   BUN 17 07/11/2017 0933   BUN 18.9 05/05/2017 0850   CREATININE 0.97 07/11/2017 0933   CREATININE 0.9 05/05/2017 0850      Component  Value Date/Time   CALCIUM 9.7 07/11/2017 0933   CALCIUM 10.0 05/05/2017 0850   ALKPHOS 54 05/05/2017 0850   AST 19 05/05/2017 0850   ALT 14 05/05/2017 0850   BILITOT 0.48 05/05/2017 0850      I spent 10 minutes counseling the patient face to face. The total time spent in the appointment was 15 minutes and more than 50% was on counseling.   All questions were answered. The patient knows to call the clinic with any problems, questions or concerns. No barriers to learning was detected.    Heath Lark, MD 1/17/20205:50 PM

## 2018-10-05 NOTE — Telephone Encounter (Signed)
No los °

## 2018-10-05 NOTE — Patient Instructions (Signed)

## 2018-10-05 NOTE — Patient Instructions (Signed)

## 2018-10-05 NOTE — Assessment & Plan Note (Signed)
The patient has recurrent ITP, with poor tolerance to steroid treatment, subsequently had near complete response to IVIG For now, the plan would be monthly IVIG She does not have serum sickness Her platelet count looks a little reduced but overall stable Goal is just to keep her platelet count above 50

## 2018-10-08 ENCOUNTER — Telehealth: Payer: Self-pay | Admitting: Rehabilitative and Restorative Service Providers"

## 2018-10-08 NOTE — Telephone Encounter (Signed)
Ms. Arceneaux was evaluated at our clinic in 04/2017 and her husband called today to see what exercises she could do because she awoke with dizziness that was severe in nature.  PT reviewed prior note from evaluation and most of her symptoms has resolved before I evaluated her in 04/2017.  Due to acute nature of onset today, I recommended Mr. Golay contact primary care office to ensure no further medical assessment indicated.  Then recommend a referral to our clinic noting he could call me back to see if we could work her in as soon as possible.  Bradford Cazier, PT   Phone call time around 9:15am

## 2018-11-02 ENCOUNTER — Inpatient Hospital Stay: Payer: Medicare HMO | Attending: Hematology and Oncology

## 2018-11-02 ENCOUNTER — Encounter: Payer: Self-pay | Admitting: Hematology and Oncology

## 2018-11-02 ENCOUNTER — Inpatient Hospital Stay (HOSPITAL_BASED_OUTPATIENT_CLINIC_OR_DEPARTMENT_OTHER): Payer: Medicare HMO | Admitting: Hematology and Oncology

## 2018-11-02 ENCOUNTER — Inpatient Hospital Stay: Payer: Medicare HMO

## 2018-11-02 VITALS — BP 142/60 | HR 52 | Temp 98.2°F | Resp 16

## 2018-11-02 DIAGNOSIS — R42 Dizziness and giddiness: Secondary | ICD-10-CM | POA: Diagnosis not present

## 2018-11-02 DIAGNOSIS — D693 Immune thrombocytopenic purpura: Secondary | ICD-10-CM | POA: Diagnosis not present

## 2018-11-02 DIAGNOSIS — Z79899 Other long term (current) drug therapy: Secondary | ICD-10-CM

## 2018-11-02 DIAGNOSIS — K649 Unspecified hemorrhoids: Secondary | ICD-10-CM | POA: Insufficient documentation

## 2018-11-02 DIAGNOSIS — Z95828 Presence of other vascular implants and grafts: Secondary | ICD-10-CM

## 2018-11-02 LAB — CBC WITH DIFFERENTIAL/PLATELET
Abs Immature Granulocytes: 0.01 10*3/uL (ref 0.00–0.07)
Basophils Absolute: 0 10*3/uL (ref 0.0–0.1)
Basophils Relative: 0 %
Eosinophils Absolute: 0 10*3/uL (ref 0.0–0.5)
Eosinophils Relative: 1 %
HCT: 38.4 % (ref 36.0–46.0)
HEMOGLOBIN: 12.4 g/dL (ref 12.0–15.0)
Immature Granulocytes: 0 %
Lymphocytes Relative: 32 %
Lymphs Abs: 1.9 10*3/uL (ref 0.7–4.0)
MCH: 28.8 pg (ref 26.0–34.0)
MCHC: 32.3 g/dL (ref 30.0–36.0)
MCV: 89.1 fL (ref 80.0–100.0)
Monocytes Absolute: 0.6 10*3/uL (ref 0.1–1.0)
Monocytes Relative: 10 %
Neutro Abs: 3.3 10*3/uL (ref 1.7–7.7)
Neutrophils Relative %: 57 %
Platelets: 93 10*3/uL — ABNORMAL LOW (ref 150–400)
RBC: 4.31 MIL/uL (ref 3.87–5.11)
RDW: 13.7 % (ref 11.5–15.5)
WBC: 5.9 10*3/uL (ref 4.0–10.5)
nRBC: 0 % (ref 0.0–0.2)

## 2018-11-02 MED ORDER — FAMOTIDINE 20 MG PO TABS
20.0000 mg | ORAL_TABLET | Freq: Once | ORAL | Status: AC
  Administered 2018-11-02: 20 mg via ORAL

## 2018-11-02 MED ORDER — SODIUM CHLORIDE 0.9% FLUSH
10.0000 mL | Freq: Once | INTRAVENOUS | Status: AC
  Administered 2018-11-02: 10 mL
  Filled 2018-11-02: qty 10

## 2018-11-02 MED ORDER — HEPARIN SOD (PORK) LOCK FLUSH 100 UNIT/ML IV SOLN
500.0000 [IU] | Freq: Once | INTRAVENOUS | Status: AC
  Administered 2018-11-02: 500 [IU]
  Filled 2018-11-02: qty 5

## 2018-11-02 MED ORDER — FAMOTIDINE 20 MG PO TABS
ORAL_TABLET | ORAL | Status: AC
  Filled 2018-11-02: qty 1

## 2018-11-02 MED ORDER — DIPHENHYDRAMINE HCL 25 MG PO TABS
25.0000 mg | ORAL_TABLET | Freq: Once | ORAL | Status: AC
  Administered 2018-11-02: 25 mg via ORAL
  Filled 2018-11-02: qty 1

## 2018-11-02 MED ORDER — FAMOTIDINE IN NACL 20-0.9 MG/50ML-% IV SOLN
INTRAVENOUS | Status: AC
  Filled 2018-11-02: qty 50

## 2018-11-02 MED ORDER — DIPHENHYDRAMINE HCL 25 MG PO CAPS
ORAL_CAPSULE | ORAL | Status: AC
  Filled 2018-11-02: qty 1

## 2018-11-02 MED ORDER — ACETAMINOPHEN 325 MG PO TABS
650.0000 mg | ORAL_TABLET | Freq: Once | ORAL | Status: AC
  Administered 2018-11-02: 650 mg via ORAL

## 2018-11-02 MED ORDER — ACETAMINOPHEN 325 MG PO TABS
ORAL_TABLET | ORAL | Status: AC
  Filled 2018-11-02: qty 2

## 2018-11-02 MED ORDER — IMMUNE GLOBULIN (HUMAN) 20 GM/200ML IV SOLN
55.0000 g | Freq: Once | INTRAVENOUS | Status: AC
  Administered 2018-11-02: 55 g via INTRAVENOUS
  Filled 2018-11-02: qty 400

## 2018-11-02 NOTE — Patient Instructions (Signed)

## 2018-11-02 NOTE — Progress Notes (Signed)
[name] OFFICE PROGRESS NOTE  Adrienne Ada, MD  ASSESSMENT & PLAN:  Idiopathic thrombocytopenic purpura ([place]) The patient has recurrent ITP, with poor tolerance to steroid treatment, subsequently had near complete response to IVIG For now, the plan would be monthly IVIG She does not have serum sickness Her platelet count looks a little reduced but overall stable Goal is just to keep her platelet count above 50   Bleeding hemorrhoid She has intermittent bleeding hemorrhoid, self-limiting. She was seen by GI service recently  Currently, she is still taking high fiber intake  Recurrent vertigo She has recent recurrent vertigo and is taking meclizine We discussed physical therapy but the patient's husband felt that she does not need it for now.   No orders of the defined types were placed in this encounter.   INTERVAL HISTORY: [name] 85 y.o. female returns for monthly IVIG treatment She feels well She continues to have intermittent hemorrhoidal bleeding, self-limiting No infusion reaction She had recent recurrent vertigo but denies fall.  Meclizine is helping   SUMMARY OF HEMATOLOGIC HISTORY:  This is a pleasant lady with chronic thrombocytopenia. The patient was discovered to have low platelet count when she was placed on Plavix for coronary artery disease. 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 She is being observed. On 01/04/2016, she was started on prednisone therapy for ITP when her platelet count dip under  50,000 May 2017, she tolerated prednisone poorly and prednisone is slowly tapered off 05/05/2017, she had relapsed ITP and started on IVIG with complete response to Rx but with platelet count trending down after 4 weeks. She is then scheduled for IVIG every 3-4  weeks  I have reviewed the past medical history, past surgical history, social history and family history with the patient and they are unchanged from previous note.  ALLERGIES:  is allergic to bee venom; iodides; lyrica [pregabalin]; codeine; demerol [meperidine]; ambien [zolpidem tartrate]; aspirin; celebrex [celecoxib]; darvocet [propoxyphene n-acetaminophen]; fosamax [alendronate sodium]; lipitor [atorvastatin]; morphine and related; valium [diazepam]; and iodine.  MEDICATIONS:  Current Outpatient Medications  Medication Sig Dispense Refill  . amLODipine (NORVASC) 5 MG tablet Take 1 tablet (5 mg total) by mouth daily. 90 tablet 3  . Bacillus Coagulans-Inulin (ALIGN PREBIOTIC-PROBIOTIC) 5-1.25 MG-GM CHEW Chew 1 tablet by mouth 2 (two) times daily.    . calcium citrate (CALCITRATE - DOSED IN MG ELEMENTAL CALCIUM) 950 MG tablet Take 1 tablet by mouth daily.    . cetirizine (ZYRTEC) 10 MG tablet Take 10 mg by mouth daily.    . Cholecalciferol (VITAMIN D-3 PO) Take 1 tablet by mouth daily.    Marland Kitchen EPINEPHrine (ADRENALIN) 1 MG/ML injection Inject 1 mg as directed as needed for anaphylaxis. Reported on 02/01/2016    . ezetimibe (ZETIA) 10 MG tablet Take 1 tablet (10 mg total) by mouth daily. 30 tablet 6  . lidocaine-prilocaine (EMLA) cream Apply 1 application topically as needed. 30 g 6  . Lysine 500 MG CAPS Take 1 capsule by mouth 2 (two) times daily as needed (fever blisters).     . meclizine (ANTIVERT) 25 MG tablet Take 25 mg by mouth daily as needed for dizziness.     . Multiple Vitamins-Minerals (CENTRUM SILVER 50+WOMEN PO) Take 1 tablet by mouth daily.    . nitroGLYCERIN (NITROSTAT) 0.4 MG SL tablet Place 0.4 mg under  the tongue every 5 (five) minutes as needed for chest pain. Reported on 02/01/2016    . Omega 3 1000 MG CAPS Take 1 each by mouth daily.     Marland Kitchen omeprazole (PRILOSEC) 40 MG capsule Take 40 mg by mouth daily.    . Prenatal Vit-Fe Fumarate-FA (PRENATAL VITAMIN PO) Take 1 tablet by  mouth 2 (two) times daily.    . psyllium (METAMUCIL) 58.6 % powder Take 1 packet by mouth daily.    . rosuvastatin (CRESTOR) 10 MG tablet Take 10 mg by mouth daily.  3  . sotalol (BETAPACE) 160 MG tablet TAKE 1 TABLET (160 MG TOTAL) BY MOUTH 2 (TWO) TIMES DAILY. 60 tablet 10  . triamcinolone (NASACORT) 55 MCG/ACT AERO nasal inhaler Place 2 sprays into the nose 2 (two) times daily. 1 Inhaler 12   No current facility-administered medications for this visit.    Facility-Administered Medications Ordered in Other Visits  Medication Dose Route Frequency Provider Last Rate Last Dose  . sodium chloride flush (NS) 0.9 % injection 10 mL  10 mL Intravenous PRN Alvy Bimler, Marirose Deveney, MD   10 mL at 09/22/17 1322     REVIEW OF SYSTEMS:   Constitutional: Denies fevers, chills or night sweats Eyes: Denies blurriness of vision Ears, nose, mouth, throat, and face: Denies mucositis or sore throat Respiratory: Denies cough, dyspnea or wheezes Cardiovascular: Denies palpitation, chest discomfort or lower extremity swelling Gastrointestinal:  Denies nausea, heartburn or change in bowel habits Skin: Denies abnormal skin rashes Lymphatics: Denies new lymphadenopathy or easy bruising Neurological:Denies numbness, tingling or new weaknesses Behavioral/Psych: Mood is stable, no new changes  All other systems were reviewed with the patient and are negative.  PHYSICAL EXAMINATION: ECOG PERFORMANCE STATUS: 1 - Symptomatic but completely ambulatory  Vitals:   11/02/18 0853  BP: 128/62  Pulse: (!) 57  Resp: 17  Temp: (!) 97.3 F (36.3 C)  SpO2: 100%   Filed Weights   11/02/18 0853  Weight: 134 lb 12.8 oz (61.1 kg)    GENERAL:alert, no distress and comfortable SKIN: skin color, texture, turgor are normal, no rashes or significant lesions EYES: normal, Conjunctiva are pink and non-injected, sclera clear OROPHARYNX:no exudate, no erythema and lips, buccal mucosa, and tongue normal  NECK: supple, thyroid normal  size, non-tender, without nodularity LYMPH:  no palpable lymphadenopathy in the cervical, axillary or inguinal LUNGS: clear to auscultation and percussion with normal breathing effort HEART: regular rate & rhythm and no murmurs and no lower extremity edema ABDOMEN:abdomen soft, non-tender and normal bowel sounds Musculoskeletal:no cyanosis of digits and no clubbing  NEURO: alert & oriented x 3 with fluent speech, no focal motor/sensory deficits  LABORATORY DATA:  I have reviewed the data as listed     Component Value Date/Time   NA 137 07/11/2017 0933   NA 142 05/05/2017 0850   K 4.3 07/11/2017 0933   K 4.0 05/05/2017 0850   CL 104 07/11/2017 0933   CL 104 08/02/2012 1515   CO2 24 07/11/2017 0933   CO2 25 05/05/2017 0850   GLUCOSE 110 (H) 07/11/2017 0933   GLUCOSE 130 05/05/2017 0850   GLUCOSE 105 (H) 08/02/2012 1515   BUN 17 07/11/2017 0933   BUN 18.9 05/05/2017 0850   CREATININE 0.97 07/11/2017 0933   CREATININE 0.9 05/05/2017 0850   CALCIUM 9.7 07/11/2017 0933   CALCIUM 10.0 05/05/2017 0850   PROT 6.9 05/05/2017 0850   ALBUMIN 3.8 05/05/2017 0850   AST 19 05/05/2017 0850   ALT 14 05/05/2017  0850   ALKPHOS 54 05/05/2017 0850   BILITOT 0.48 05/05/2017 0850   GFRNONAA 53 (L) 07/11/2017 0933   GFRAA >60 07/11/2017 0933    No results found for: SPEP, UPEP  Lab Results  Component Value Date   WBC 5.9 11/02/2018   NEUTROABS 3.3 11/02/2018   HGB 12.4 11/02/2018   HCT 38.4 11/02/2018   MCV 89.1 11/02/2018   PLT 93 (L) 11/02/2018      Chemistry      Component Value Date/Time   NA 137 07/11/2017 0933   NA 142 05/05/2017 0850   K 4.3 07/11/2017 0933   K 4.0 05/05/2017 0850   CL 104 07/11/2017 0933   CL 104 08/02/2012 1515   CO2 24 07/11/2017 0933   CO2 25 05/05/2017 0850   BUN 17 07/11/2017 0933   BUN 18.9 05/05/2017 0850   CREATININE 0.97 07/11/2017 0933   CREATININE 0.9 05/05/2017 0850      Component Value Date/Time   CALCIUM 9.7 07/11/2017 0933    CALCIUM 10.0 05/05/2017 0850   ALKPHOS 54 05/05/2017 0850   AST 19 05/05/2017 0850   ALT 14 05/05/2017 0850   BILITOT 0.48 05/05/2017 0850      I spent 10 minutes counseling the patient face to face. The total time spent in the appointment was 15 minutes and more than 50% was on counseling.   All questions were answered. The patient knows to call the clinic with any problems, questions or concerns. No barriers to learning was detected.    Heath Lark, MD 2/14/20209:15 AM

## 2018-11-02 NOTE — Assessment & Plan Note (Signed)
The patient has recurrent ITP, with poor tolerance to steroid treatment, subsequently had near complete response to IVIG For now, the plan would be monthly IVIG She does not have serum sickness Her platelet count looks a little reduced but overall stable Goal is just to keep her platelet count above 50

## 2018-11-02 NOTE — Assessment & Plan Note (Signed)
She has intermittent bleeding hemorrhoid, self-limiting. She was seen by GI service recently  Currently, she is still taking high fiber intake

## 2018-11-02 NOTE — Assessment & Plan Note (Signed)
She has recent recurrent vertigo and is taking meclizine We discussed physical therapy but the patient's husband felt that she does not need it for now.

## 2018-11-30 ENCOUNTER — Inpatient Hospital Stay: Payer: Medicare HMO | Attending: Hematology and Oncology

## 2018-11-30 ENCOUNTER — Inpatient Hospital Stay (HOSPITAL_BASED_OUTPATIENT_CLINIC_OR_DEPARTMENT_OTHER): Payer: Medicare HMO | Admitting: Hematology and Oncology

## 2018-11-30 ENCOUNTER — Encounter: Payer: Self-pay | Admitting: Hematology and Oncology

## 2018-11-30 ENCOUNTER — Inpatient Hospital Stay: Payer: Medicare HMO

## 2018-11-30 ENCOUNTER — Other Ambulatory Visit: Payer: Self-pay

## 2018-11-30 VITALS — BP 144/68 | HR 55 | Temp 98.2°F | Resp 18

## 2018-11-30 DIAGNOSIS — Z79899 Other long term (current) drug therapy: Secondary | ICD-10-CM | POA: Insufficient documentation

## 2018-11-30 DIAGNOSIS — D693 Immune thrombocytopenic purpura: Secondary | ICD-10-CM | POA: Diagnosis not present

## 2018-11-30 DIAGNOSIS — Z95828 Presence of other vascular implants and grafts: Secondary | ICD-10-CM

## 2018-11-30 DIAGNOSIS — K649 Unspecified hemorrhoids: Secondary | ICD-10-CM

## 2018-11-30 LAB — CBC WITH DIFFERENTIAL/PLATELET
Abs Immature Granulocytes: 0.01 10*3/uL (ref 0.00–0.07)
Basophils Absolute: 0 10*3/uL (ref 0.0–0.1)
Basophils Relative: 0 %
Eosinophils Absolute: 0.1 10*3/uL (ref 0.0–0.5)
Eosinophils Relative: 1 %
HCT: 36.5 % (ref 36.0–46.0)
Hemoglobin: 11.9 g/dL — ABNORMAL LOW (ref 12.0–15.0)
Immature Granulocytes: 0 %
Lymphocytes Relative: 32 %
Lymphs Abs: 2.1 10*3/uL (ref 0.7–4.0)
MCH: 29.1 pg (ref 26.0–34.0)
MCHC: 32.6 g/dL (ref 30.0–36.0)
MCV: 89.2 fL (ref 80.0–100.0)
Monocytes Absolute: 0.6 10*3/uL (ref 0.1–1.0)
Monocytes Relative: 8 %
Neutro Abs: 4 10*3/uL (ref 1.7–7.7)
Neutrophils Relative %: 59 %
Platelets: 97 10*3/uL — ABNORMAL LOW (ref 150–400)
RBC: 4.09 MIL/uL (ref 3.87–5.11)
RDW: 13.8 % (ref 11.5–15.5)
WBC: 6.8 10*3/uL (ref 4.0–10.5)
nRBC: 0 % (ref 0.0–0.2)

## 2018-11-30 MED ORDER — SODIUM CHLORIDE 0.9% FLUSH
10.0000 mL | Freq: Once | INTRAVENOUS | Status: AC
  Administered 2018-11-30: 10 mL
  Filled 2018-11-30: qty 10

## 2018-11-30 MED ORDER — SODIUM CHLORIDE 0.9 % IV SOLN
INTRAVENOUS | Status: DC
  Administered 2018-11-30: 10:00:00 via INTRAVENOUS
  Filled 2018-11-30: qty 250

## 2018-11-30 MED ORDER — HEPARIN SOD (PORK) LOCK FLUSH 100 UNIT/ML IV SOLN
500.0000 [IU] | Freq: Once | INTRAVENOUS | Status: AC
  Administered 2018-11-30: 500 [IU]
  Filled 2018-11-30: qty 5

## 2018-11-30 MED ORDER — FAMOTIDINE 20 MG PO TABS
ORAL_TABLET | ORAL | Status: AC
  Filled 2018-11-30: qty 1

## 2018-11-30 MED ORDER — ACETAMINOPHEN 325 MG PO TABS
ORAL_TABLET | ORAL | Status: AC
  Filled 2018-11-30: qty 2

## 2018-11-30 MED ORDER — FAMOTIDINE 20 MG PO TABS
20.0000 mg | ORAL_TABLET | Freq: Once | ORAL | Status: AC
  Administered 2018-11-30: 20 mg via ORAL

## 2018-11-30 MED ORDER — IMMUNE GLOBULIN (HUMAN) 20 GM/200ML IV SOLN
55.0000 g | Freq: Once | INTRAVENOUS | Status: AC
  Administered 2018-11-30: 55 g via INTRAVENOUS
  Filled 2018-11-30: qty 400

## 2018-11-30 MED ORDER — DIPHENHYDRAMINE HCL 25 MG PO TABS
25.0000 mg | ORAL_TABLET | Freq: Once | ORAL | Status: AC
  Administered 2018-11-30: 25 mg via ORAL
  Filled 2018-11-30: qty 1

## 2018-11-30 MED ORDER — DIPHENHYDRAMINE HCL 25 MG PO CAPS
ORAL_CAPSULE | ORAL | Status: AC
  Filled 2018-11-30: qty 1

## 2018-11-30 MED ORDER — ACETAMINOPHEN 325 MG PO TABS
650.0000 mg | ORAL_TABLET | Freq: Once | ORAL | Status: AC
  Administered 2018-11-30: 650 mg via ORAL

## 2018-11-30 NOTE — Progress Notes (Signed)
[facility] OFFICE PROGRESS NOTE  Adrienne Ada, MD  ASSESSMENT & PLAN:  Idiopathic thrombocytopenic purpura ([place]) The patient has recurrent ITP, with poor tolerance to steroid treatment, subsequently had near complete response to IVIG She does not have serum sickness Her platelet count looks a little reduced but overall stable Goal is just to keep her platelet count above 50 Since the patient is asymptomatic, her husband has consider stopping treatment next month I will delay her next treatment by 1 week and see her back If her platelet count remains stable, we will consider holding treatment and give her treatment holiday for few months However, if her repeat CBC showed worsening thrombocytopenia, we will continue monthly IVIG treatment.   Bleeding hemorrhoid She has intermittent bleeding hemorrhoid, self-limiting. She was seen by GI service recently  Currently, she is still taking high fiber intake   No orders of the defined types were placed in this encounter.   INTERVAL HISTORY: [name] 85 y.o. female returns for further follow-up with her husband She continues to have intermittent hemorrhoidal bleeding twice a month but not significant She feels fine otherwise No infusion reaction No recent infection, fever or chills  SUMMARY OF HEMATOLOGIC HISTORY: This is a pleasant lady with chronic thrombocytopenia. The patient was discovered to have low platelet count when she was placed on Plavix for coronary artery disease. 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 She is being observed. On 01/04/2016, she was started on prednisone therapy for ITP when her platelet count dip under  50,000 May 2017, she tolerated prednisone poorly and prednisone is slowly tapered off 05/05/2017, she had  relapsed ITP and started on IVIG with complete response to Rx but with platelet count trending down after 4 weeks. She is then scheduled for IVIG every 3-4 weeks  I have reviewed the past medical history, past surgical history, social history and family history with the patient and they are unchanged from previous note.  ALLERGIES:  is allergic to bee venom; iodides; lyrica [pregabalin]; codeine; demerol [meperidine]; ambien [zolpidem tartrate]; aspirin; celebrex [celecoxib]; darvocet [propoxyphene n-acetaminophen]; fosamax [alendronate sodium]; lipitor [atorvastatin]; morphine and related; valium [diazepam]; and iodine.  MEDICATIONS:  Current Outpatient Medications  Medication Sig Dispense Refill  . amLODipine (NORVASC) 5 MG tablet Take 1 tablet (5 mg total) by mouth daily. 90 tablet 3  . Bacillus Coagulans-Inulin (ALIGN PREBIOTIC-PROBIOTIC) 5-1.25 MG-GM CHEW Chew 1 tablet by mouth 2 (two) times daily.    . calcium citrate (CALCITRATE - DOSED IN MG ELEMENTAL CALCIUM) 950 MG tablet Take 1 tablet by mouth daily.    . cetirizine (ZYRTEC) 10 MG tablet Take 10 mg by mouth daily.    . Cholecalciferol (VITAMIN D-3 PO) Take 1 tablet by mouth daily.    Marland Kitchen EPINEPHrine (ADRENALIN) 1 MG/ML injection Inject 1 mg as directed as needed for anaphylaxis. Reported on 02/01/2016    . ezetimibe (ZETIA) 10 MG tablet Take 1 tablet (10 mg total) by mouth daily. 30 tablet 6  . lidocaine-prilocaine (EMLA) cream Apply 1 application topically as needed. 30 g 6  . Lysine 500 MG CAPS Take 1 capsule by mouth 2 (two) times daily as needed (fever blisters).     . meclizine (ANTIVERT) 25 MG tablet Take 25 mg by mouth daily as needed for dizziness.     . Multiple Vitamins-Minerals (CENTRUM SILVER  50+WOMEN PO) Take 1 tablet by mouth daily.    . nitroGLYCERIN (NITROSTAT) 0.4 MG SL tablet Place 0.4 mg under the tongue every 5 (five) minutes as needed for chest pain. Reported on 02/01/2016    . Omega 3 1000 MG CAPS Take 1 each by  mouth daily.     Marland Kitchen omeprazole (PRILOSEC) 40 MG capsule Take 40 mg by mouth daily.    . Prenatal Vit-Fe Fumarate-FA (PRENATAL VITAMIN PO) Take 1 tablet by mouth 2 (two) times daily.    . psyllium (METAMUCIL) 58.6 % powder Take 1 packet by mouth daily.    . rosuvastatin (CRESTOR) 10 MG tablet Take 10 mg by mouth daily.  3  . sotalol (BETAPACE) 160 MG tablet TAKE 1 TABLET (160 MG TOTAL) BY MOUTH 2 (TWO) TIMES DAILY. 60 tablet 10  . triamcinolone (NASACORT) 55 MCG/ACT AERO nasal inhaler Place 2 sprays into the nose 2 (two) times daily. 1 Inhaler 12   No current facility-administered medications for this visit.    Facility-Administered Medications Ordered in Other Visits  Medication Dose Route Frequency Provider Last Rate Last Dose  . 0.9 %  sodium chloride infusion   Intravenous Continuous Heath Lark, MD   Stopped at 11/30/18 1530  . sodium chloride flush (NS) 0.9 % injection 10 mL  10 mL Intravenous PRN Alvy Bimler, Lejla Moeser, MD   10 mL at 09/22/17 1322     REVIEW OF SYSTEMS:   Constitutional: Denies fevers, chills or night sweats Eyes: Denies blurriness of vision Ears, nose, mouth, throat, and face: Denies mucositis or sore throat Respiratory: Denies cough, dyspnea or wheezes Cardiovascular: Denies palpitation, chest discomfort or lower extremity swelling Gastrointestinal:  Denies nausea, heartburn or change in bowel habits Skin: Denies abnormal skin rashes Lymphatics: Denies new lymphadenopathy or easy bruising Neurological:Denies numbness, tingling or new weaknesses Behavioral/Psych: Mood is stable, no new changes  All other systems were reviewed with the patient and are negative.  PHYSICAL EXAMINATION: ECOG PERFORMANCE STATUS: 0 - Asymptomatic  Vitals:   11/30/18 0908  BP: (!) 141/53  Pulse: 75  Resp: 18  Temp: 97.8 F (36.6 C)  SpO2: 100%   Filed Weights   11/30/18 0908  Weight: 137 lb 3.2 oz (62.2 kg)    GENERAL:alert, no distress and comfortable SKIN: skin color, texture,  turgor are normal, no rashes or significant lesions EYES: normal, Conjunctiva are pink and non-injected, sclera clear OROPHARYNX:no exudate, no erythema and lips, buccal mucosa, and tongue normal  NECK: supple, thyroid normal size, non-tender, without nodularity LYMPH:  no palpable lymphadenopathy in the cervical, axillary or inguinal LUNGS: clear to auscultation and percussion with normal breathing effort HEART: regular rate & rhythm with soft systolic murmurs and no lower extremity edema ABDOMEN:abdomen soft, non-tender and normal bowel sounds Musculoskeletal:no cyanosis of digits and no clubbing  NEURO: alert & oriented x 3 with fluent speech, no focal motor/sensory deficits  LABORATORY DATA:  I have reviewed the data as listed     Component Value Date/Time   NA 137 07/11/2017 0933   NA 142 05/05/2017 0850   K 4.3 07/11/2017 0933   K 4.0 05/05/2017 0850   CL 104 07/11/2017 0933   CL 104 08/02/2012 1515   CO2 24 07/11/2017 0933   CO2 25 05/05/2017 0850   GLUCOSE 110 (H) 07/11/2017 0933   GLUCOSE 130 05/05/2017 0850   GLUCOSE 105 (H) 08/02/2012 1515   BUN 17 07/11/2017 0933   BUN 18.9 05/05/2017 0850   CREATININE 0.97 07/11/2017 0933  CREATININE 0.9 05/05/2017 0850   CALCIUM 9.7 07/11/2017 0933   CALCIUM 10.0 05/05/2017 0850   PROT 6.9 05/05/2017 0850   ALBUMIN 3.8 05/05/2017 0850   AST 19 05/05/2017 0850   ALT 14 05/05/2017 0850   ALKPHOS 54 05/05/2017 0850   BILITOT 0.48 05/05/2017 0850   GFRNONAA 53 (L) 07/11/2017 0933   GFRAA >60 07/11/2017 0933    No results found for: SPEP, UPEP  Lab Results  Component Value Date   WBC 6.8 11/30/2018   NEUTROABS 4.0 11/30/2018   HGB 11.9 (L) 11/30/2018   HCT 36.5 11/30/2018   MCV 89.2 11/30/2018   PLT 97 (L) 11/30/2018      Chemistry      Component Value Date/Time   NA 137 07/11/2017 0933   NA 142 05/05/2017 0850   K 4.3 07/11/2017 0933   K 4.0 05/05/2017 0850   CL 104 07/11/2017 0933   CL 104 08/02/2012 1515    CO2 24 07/11/2017 0933   CO2 25 05/05/2017 0850   BUN 17 07/11/2017 0933   BUN 18.9 05/05/2017 0850   CREATININE 0.97 07/11/2017 0933   CREATININE 0.9 05/05/2017 0850      Component Value Date/Time   CALCIUM 9.7 07/11/2017 0933   CALCIUM 10.0 05/05/2017 0850   ALKPHOS 54 05/05/2017 0850   AST 19 05/05/2017 0850   ALT 14 05/05/2017 0850   BILITOT 0.48 05/05/2017 0850       I spent 10 minutes counseling the patient face to face. The total time spent in the appointment was 15 minutes and more than 50% was on counseling.   All questions were answered. The patient knows to call the clinic with any problems, questions or concerns. No barriers to learning was detected.    Heath Lark, MD 3/13/20204:22 PM

## 2018-11-30 NOTE — Assessment & Plan Note (Signed)
She has intermittent bleeding hemorrhoid, self-limiting. She was seen by GI service recently  Currently, she is still taking high fiber intake

## 2018-11-30 NOTE — Patient Instructions (Signed)

## 2018-11-30 NOTE — Assessment & Plan Note (Signed)
The patient has recurrent ITP, with poor tolerance to steroid treatment, subsequently had near complete response to IVIG She does not have serum sickness Her platelet count looks a little reduced but overall stable Goal is just to keep her platelet count above 50 Since the patient is asymptomatic, her husband has consider stopping treatment next month I will delay her next treatment by 1 week and see her back If her platelet count remains stable, we will consider holding treatment and give her treatment holiday for few months However, if her repeat CBC showed worsening thrombocytopenia, we will continue monthly IVIG treatment.

## 2018-12-11 IMAGING — US IR US GUIDE VASC ACCESS RIGHT
1 series · 1 of 1 positions shown · non-contrast
Comparison: None.

INDICATION: History of ITP. In need of durable intravenous access for frequent
blood draws and transfusions.

EXAM:
IMPLANTED PORT A CATH PLACEMENT WITH ULTRASOUND AND FLUOROSCOPIC
GUIDANCE

[Series 1: ir fluoro/shunt/fist · 1 of 1 slices shown]
[im 1/1]
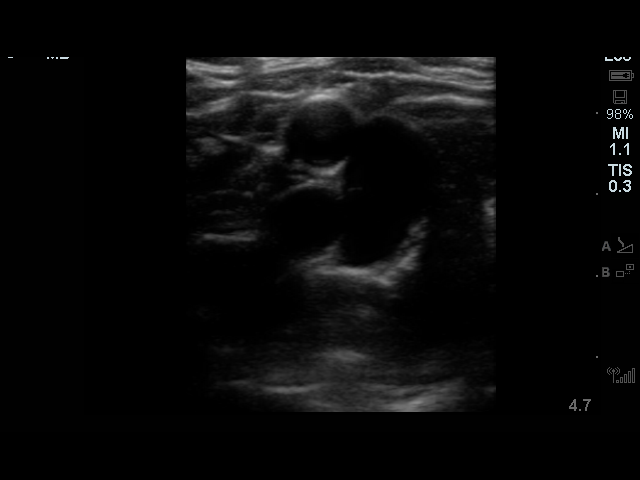

[1 of 1 positions shown; findings below may reference images not displayed]

MEDICATIONS:
Ancef 2 gm IV; The antibiotic was administered within an appropriate
time interval prior to skin puncture.

ANESTHESIA/SEDATION:
Moderate (conscious) sedation was employed during this procedure. A
total of Versed 2 mg and Fentanyl 10 mcg was administered
intravenously.

Moderate Sedation Time: 26 minutes. The patient's level of
consciousness and vital signs were monitored continuously by
radiology nursing throughout the procedure under my direct
supervision.

CONTRAST:  None

FLUOROSCOPY TIME:  24 seconds (5 mGy)

COMPLICATIONS:
None immediate.

PROCEDURE:
The procedure, risks, benefits, and alternatives were explained to
the patient. Questions regarding the procedure were encouraged and
answered. The patient understands and consents to the procedure.

The right neck and chest were prepped with chlorhexidine in a
sterile fashion, and a sterile drape was applied covering the
operative field. Maximum barrier sterile technique with sterile
gowns and gloves were used for the procedure. A timeout was
performed prior to the initiation of the procedure. Local anesthesia
was provided with 1% lidocaine with epinephrine.

After creating a small venotomy incision, a micropuncture kit was
utilized to access the internal jugular vein. Real-time ultrasound
guidance was utilized for vascular access including the acquisition
of a permanent ultrasound image documenting patency of the accessed
vessel. The microwire was utilized to measure appropriate catheter
length.

A subcutaneous port pocket was then created along the upper chest
wall utilizing a combination of sharp and blunt dissection. The
pocket was irrigated with sterile saline. A single lumen thin power
injectable port was chosen for placement. The 8 Fr catheter was
tunneled from the port pocket site to the venotomy incision. The
port was placed in the pocket. The external catheter was trimmed to
appropriate length. At the venotomy, an 8 Fr peel-away sheath was
placed over a guidewire under fluoroscopic guidance. The catheter
was then placed through the sheath and the sheath was removed. Final
catheter positioning was confirmed and documented with a
fluoroscopic spot radiograph. The port was accessed with Shaniah Ahmadi
needle, aspirated and flushed with heparinized saline.

The venotomy site was closed with an interrupted 4-0 Vicryl suture.
The port pocket incision was closed with interrupted 2-0 Vicryl
suture and the skin was opposed with a running subcuticular 4-0
Vicryl suture. Dermabond and Bustami were applied to both
incisions. Dressings were placed. The patient tolerated the
procedure well without immediate post procedural complication.
FINDINGS: After catheter placement, the tip lies within the superior
cavoatrial junction. The catheter aspirates and flushes normally and
is ready for immediate use.
IMPRESSION: Successful placement of a right internal jugular approach power
injectable Port-A-Cath. The catheter is ready for immediate use.

## 2019-01-01 ENCOUNTER — Telehealth: Payer: Self-pay | Admitting: *Deleted

## 2019-01-01 ENCOUNTER — Other Ambulatory Visit: Payer: Self-pay | Admitting: Hematology and Oncology

## 2019-01-01 DIAGNOSIS — D693 Immune thrombocytopenic purpura: Secondary | ICD-10-CM

## 2019-01-01 NOTE — Telephone Encounter (Signed)
Telephone call to patient's husband. He agrees to bring patient in tomorrow at 3pm for her lab appt. Advised this would not be drawn from her port to save her the time and exposure to others. He understands that we will call with the results and this might mean her appointments on Friday will be cancelled if her labs are stable.

## 2019-01-02 ENCOUNTER — Other Ambulatory Visit: Payer: Self-pay

## 2019-01-02 ENCOUNTER — Inpatient Hospital Stay: Payer: Medicare HMO | Attending: Hematology and Oncology

## 2019-01-02 DIAGNOSIS — D693 Immune thrombocytopenic purpura: Secondary | ICD-10-CM | POA: Diagnosis not present

## 2019-01-02 LAB — CBC WITH DIFFERENTIAL/PLATELET
Abs Immature Granulocytes: 0.02 10*3/uL (ref 0.00–0.07)
Basophils Absolute: 0 10*3/uL (ref 0.0–0.1)
Basophils Relative: 1 %
Eosinophils Absolute: 0.1 10*3/uL (ref 0.0–0.5)
Eosinophils Relative: 1 %
HCT: 40.6 % (ref 36.0–46.0)
Hemoglobin: 13.1 g/dL (ref 12.0–15.0)
Immature Granulocytes: 0 %
Lymphocytes Relative: 42 %
Lymphs Abs: 3.2 10*3/uL (ref 0.7–4.0)
MCH: 29 pg (ref 26.0–34.0)
MCHC: 32.3 g/dL (ref 30.0–36.0)
MCV: 89.8 fL (ref 80.0–100.0)
Monocytes Absolute: 0.8 10*3/uL (ref 0.1–1.0)
Monocytes Relative: 10 %
Neutro Abs: 3.6 10*3/uL (ref 1.7–7.7)
Neutrophils Relative %: 46 %
Platelets: 84 10*3/uL — ABNORMAL LOW (ref 150–400)
RBC: 4.52 MIL/uL (ref 3.87–5.11)
RDW: 13.7 % (ref 11.5–15.5)
WBC: 7.7 10*3/uL (ref 4.0–10.5)
nRBC: 0 % (ref 0.0–0.2)

## 2019-01-03 ENCOUNTER — Telehealth: Payer: Self-pay | Admitting: Hematology and Oncology

## 2019-01-03 ENCOUNTER — Telehealth: Payer: Self-pay

## 2019-01-03 NOTE — Telephone Encounter (Signed)
Made lab appt per sch msg. Called and spoke with patient. Confirmed date and time.

## 2019-01-03 NOTE — Telephone Encounter (Signed)
Called and given below message to husband. He verbalized understanding. Scheduling message sent.

## 2019-01-03 NOTE — Telephone Encounter (Signed)
-----   Message from Heath Lark, MD sent at 01/03/2019  8:03 AM EDT ----- Regarding: CBC is stable Pls call her husband Platelet is stable I suggest we cancel treatment tomorrow and schedule appt in 1 month with labs only

## 2019-01-04 ENCOUNTER — Ambulatory Visit: Payer: Medicare HMO | Admitting: Hematology and Oncology

## 2019-01-04 ENCOUNTER — Other Ambulatory Visit: Payer: Medicare HMO

## 2019-01-04 ENCOUNTER — Ambulatory Visit: Payer: Medicare HMO

## 2019-02-04 ENCOUNTER — Other Ambulatory Visit: Payer: Self-pay

## 2019-02-04 ENCOUNTER — Telehealth: Payer: Self-pay | Admitting: *Deleted

## 2019-02-04 ENCOUNTER — Inpatient Hospital Stay: Payer: Medicare HMO | Attending: Hematology and Oncology

## 2019-02-04 DIAGNOSIS — D693 Immune thrombocytopenic purpura: Secondary | ICD-10-CM | POA: Insufficient documentation

## 2019-02-04 LAB — CBC WITH DIFFERENTIAL/PLATELET
Abs Immature Granulocytes: 0.04 10*3/uL (ref 0.00–0.07)
Basophils Absolute: 0 10*3/uL (ref 0.0–0.1)
Basophils Relative: 1 %
Eosinophils Absolute: 0 10*3/uL (ref 0.0–0.5)
Eosinophils Relative: 0 %
HCT: 39.4 % (ref 36.0–46.0)
Hemoglobin: 12.6 g/dL (ref 12.0–15.0)
Immature Granulocytes: 1 %
Lymphocytes Relative: 36 %
Lymphs Abs: 2.8 10*3/uL (ref 0.7–4.0)
MCH: 28.5 pg (ref 26.0–34.0)
MCHC: 32 g/dL (ref 30.0–36.0)
MCV: 89.1 fL (ref 80.0–100.0)
Monocytes Absolute: 0.7 10*3/uL (ref 0.1–1.0)
Monocytes Relative: 10 %
Neutro Abs: 4.2 10*3/uL (ref 1.7–7.7)
Neutrophils Relative %: 52 %
Platelets: 84 10*3/uL — ABNORMAL LOW (ref 150–400)
RBC: 4.42 MIL/uL (ref 3.87–5.11)
RDW: 13.2 % (ref 11.5–15.5)
WBC: 7.8 10*3/uL (ref 4.0–10.5)
nRBC: 0 % (ref 0.0–0.2)

## 2019-02-04 NOTE — Telephone Encounter (Signed)
Telephone call to husband to discuss lab results. He understands the port will not be flushed at this time. She will return to see Dr Alvy Bimler in 3 months. A scheduling message was sent. He states he knows what to look for if there is any issues he will call.

## 2019-02-06 ENCOUNTER — Telehealth: Payer: Self-pay | Admitting: Hematology and Oncology

## 2019-02-06 NOTE — Telephone Encounter (Signed)
Scheduled appt per sch msg. Called and spoke with patient. Confirmed date and time °

## 2019-03-24 DIAGNOSIS — D696 Thrombocytopenia, unspecified: Secondary | ICD-10-CM | POA: Diagnosis not present

## 2019-03-24 DIAGNOSIS — Z8673 Personal history of transient ischemic attack (TIA), and cerebral infarction without residual deficits: Secondary | ICD-10-CM | POA: Diagnosis not present

## 2019-03-24 DIAGNOSIS — R410 Disorientation, unspecified: Secondary | ICD-10-CM | POA: Diagnosis not present

## 2019-03-24 DIAGNOSIS — R4182 Altered mental status, unspecified: Secondary | ICD-10-CM | POA: Diagnosis not present

## 2019-03-24 DIAGNOSIS — E871 Hypo-osmolality and hyponatremia: Secondary | ICD-10-CM | POA: Diagnosis not present

## 2019-03-24 DIAGNOSIS — R404 Transient alteration of awareness: Secondary | ICD-10-CM | POA: Diagnosis not present

## 2019-03-25 DIAGNOSIS — K219 Gastro-esophageal reflux disease without esophagitis: Secondary | ICD-10-CM | POA: Diagnosis not present

## 2019-03-25 DIAGNOSIS — Z8673 Personal history of transient ischemic attack (TIA), and cerebral infarction without residual deficits: Secondary | ICD-10-CM | POA: Diagnosis not present

## 2019-03-25 DIAGNOSIS — I10 Essential (primary) hypertension: Secondary | ICD-10-CM

## 2019-03-25 DIAGNOSIS — E785 Hyperlipidemia, unspecified: Secondary | ICD-10-CM | POA: Diagnosis not present

## 2019-03-25 DIAGNOSIS — R4182 Altered mental status, unspecified: Secondary | ICD-10-CM | POA: Diagnosis not present

## 2019-03-25 DIAGNOSIS — R413 Other amnesia: Secondary | ICD-10-CM | POA: Diagnosis not present

## 2019-03-25 DIAGNOSIS — Z8679 Personal history of other diseases of the circulatory system: Secondary | ICD-10-CM | POA: Diagnosis not present

## 2019-03-26 DIAGNOSIS — E854 Organ-limited amyloidosis: Secondary | ICD-10-CM | POA: Diagnosis not present

## 2019-03-26 DIAGNOSIS — R4182 Altered mental status, unspecified: Secondary | ICD-10-CM | POA: Diagnosis not present

## 2019-03-26 DIAGNOSIS — K219 Gastro-esophageal reflux disease without esophagitis: Secondary | ICD-10-CM | POA: Diagnosis not present

## 2019-03-26 DIAGNOSIS — I252 Old myocardial infarction: Secondary | ICD-10-CM | POA: Diagnosis not present

## 2019-03-26 DIAGNOSIS — E871 Hypo-osmolality and hyponatremia: Secondary | ICD-10-CM | POA: Diagnosis not present

## 2019-03-26 DIAGNOSIS — R69 Illness, unspecified: Secondary | ICD-10-CM | POA: Diagnosis not present

## 2019-03-26 DIAGNOSIS — E785 Hyperlipidemia, unspecified: Secondary | ICD-10-CM | POA: Diagnosis not present

## 2019-03-26 DIAGNOSIS — I10 Essential (primary) hypertension: Secondary | ICD-10-CM | POA: Diagnosis not present

## 2019-03-26 DIAGNOSIS — Z8679 Personal history of other diseases of the circulatory system: Secondary | ICD-10-CM | POA: Diagnosis not present

## 2019-03-26 DIAGNOSIS — G9340 Encephalopathy, unspecified: Secondary | ICD-10-CM | POA: Diagnosis not present

## 2019-03-26 DIAGNOSIS — Z8673 Personal history of transient ischemic attack (TIA), and cerebral infarction without residual deficits: Secondary | ICD-10-CM | POA: Diagnosis not present

## 2019-04-01 DIAGNOSIS — R69 Illness, unspecified: Secondary | ICD-10-CM | POA: Diagnosis not present

## 2019-04-01 DIAGNOSIS — D696 Thrombocytopenia, unspecified: Secondary | ICD-10-CM | POA: Diagnosis not present

## 2019-04-01 DIAGNOSIS — R413 Other amnesia: Secondary | ICD-10-CM | POA: Diagnosis not present

## 2019-05-07 ENCOUNTER — Inpatient Hospital Stay: Payer: Medicare HMO

## 2019-05-07 ENCOUNTER — Other Ambulatory Visit: Payer: Self-pay

## 2019-05-07 ENCOUNTER — Inpatient Hospital Stay: Payer: Medicare HMO | Attending: Hematology and Oncology | Admitting: Hematology and Oncology

## 2019-05-07 ENCOUNTER — Encounter: Payer: Self-pay | Admitting: Hematology and Oncology

## 2019-05-07 DIAGNOSIS — D693 Immune thrombocytopenic purpura: Secondary | ICD-10-CM | POA: Diagnosis not present

## 2019-05-07 DIAGNOSIS — Z79899 Other long term (current) drug therapy: Secondary | ICD-10-CM | POA: Insufficient documentation

## 2019-05-07 DIAGNOSIS — F0390 Unspecified dementia without behavioral disturbance: Secondary | ICD-10-CM | POA: Insufficient documentation

## 2019-05-07 DIAGNOSIS — R69 Illness, unspecified: Secondary | ICD-10-CM | POA: Diagnosis not present

## 2019-05-07 DIAGNOSIS — D649 Anemia, unspecified: Secondary | ICD-10-CM | POA: Diagnosis not present

## 2019-05-07 DIAGNOSIS — D696 Thrombocytopenia, unspecified: Secondary | ICD-10-CM | POA: Diagnosis not present

## 2019-05-07 DIAGNOSIS — R413 Other amnesia: Secondary | ICD-10-CM

## 2019-05-07 LAB — CBC WITH DIFFERENTIAL/PLATELET
Abs Immature Granulocytes: 0.03 10*3/uL (ref 0.00–0.07)
Basophils Absolute: 0 10*3/uL (ref 0.0–0.1)
Basophils Relative: 0 %
Eosinophils Absolute: 0.1 10*3/uL (ref 0.0–0.5)
Eosinophils Relative: 1 %
HCT: 30.2 % — ABNORMAL LOW (ref 36.0–46.0)
Hemoglobin: 9.6 g/dL — ABNORMAL LOW (ref 12.0–15.0)
Immature Granulocytes: 0 %
Lymphocytes Relative: 27 %
Lymphs Abs: 2.1 10*3/uL (ref 0.7–4.0)
MCH: 28.6 pg (ref 26.0–34.0)
MCHC: 31.8 g/dL (ref 30.0–36.0)
MCV: 89.9 fL (ref 80.0–100.0)
Monocytes Absolute: 0.9 10*3/uL (ref 0.1–1.0)
Monocytes Relative: 11 %
Neutro Abs: 4.7 10*3/uL (ref 1.7–7.7)
Neutrophils Relative %: 61 %
Platelets: 42 10*3/uL — ABNORMAL LOW (ref 150–400)
RBC: 3.36 MIL/uL — ABNORMAL LOW (ref 3.87–5.11)
RDW: 14.6 % (ref 11.5–15.5)
WBC: 7.8 10*3/uL (ref 4.0–10.5)
nRBC: 0 % (ref 0.0–0.2)

## 2019-05-07 NOTE — Assessment & Plan Note (Signed)
She has acute anemia could be due to recent blood loss She is not symptomatic Observe only for now

## 2019-05-07 NOTE — Progress Notes (Signed)
[facility] OFFICE PROGRESS NOTE  Adrienne Ada, MD  ASSESSMENT & PLAN:  Idiopathic thrombocytopenic purpura ([place]) She has worsening thrombocytopenia since we discontinued IVIG I reviewed the plan of care with her husband We will try to get home IVIG infusion set up If not possible, we will bring her back to the infusion room for repeat IVIG treatment  Acute anemia She has acute anemia could be due to recent blood loss She is not symptomatic Observe only for now  Poor memory The patient has dementia and is dependent on her husband for all medical decisions I reviewed the plan of care with the husband and we discussed visitor policy if she were to return here for future IVIG treatment For now, we will try to get her IVIG infusion at home   No orders of the defined types were placed in this encounter.   INTERVAL HISTORY: [name] 85 y.o. female returns for further follow-up The patient has poor memory and does not understand why she is here Her husband did not have a cell phone but after looking for him in the parking lot, I was able to talk to the husband She continues to have intermittent hemorrhoidal bleeding but not frequent He did not observe any other bleeding No reported recent infection Her last IVIG treatment was on November 30, 2018  SUMMARY OF HEMATOLOGIC HISTORY:  This is a pleasant lady with chronic thrombocytopenia. The patient was discovered to have low platelet count when she was placed on Plavix for coronary artery disease. 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 She is being observed. On 01/04/2016, she was started on prednisone therapy for ITP when her platelet count dip under  50,000 May 2017, she tolerated prednisone poorly and prednisone is slowly tapered  off 05/05/2017, she had relapsed ITP and started on IVIG with complete response to Rx but with platelet count trending down after 4 weeks. She is then scheduled for IVIG every 3-4 weeks  I have reviewed the past medical history, past surgical history, social history and family history with the patient and they are unchanged from previous note.  ALLERGIES:  is allergic to bee venom; iodides; lyrica [pregabalin]; codeine; demerol [meperidine]; ambien [zolpidem tartrate]; aspirin; celebrex [celecoxib]; darvocet [propoxyphene n-acetaminophen]; fosamax [alendronate sodium]; lipitor [atorvastatin]; morphine and related; valium [diazepam]; and iodine.  MEDICATIONS:  Current Outpatient Medications  Medication Sig Dispense Refill  . amLODipine (NORVASC) 5 MG tablet Take 1 tablet (5 mg total) by mouth daily. 90 tablet 3  . Bacillus Coagulans-Inulin (ALIGN PREBIOTIC-PROBIOTIC) 5-1.25 MG-GM CHEW Chew 1 tablet by mouth 2 (two) times daily.    . calcium citrate (CALCITRATE - DOSED IN MG ELEMENTAL CALCIUM) 950 MG tablet Take 1 tablet by mouth daily.    . cetirizine (ZYRTEC) 10 MG tablet Take 10 mg by mouth daily.    . Cholecalciferol (VITAMIN D-3 PO) Take 1 tablet by mouth daily.    Marland Kitchen EPINEPHrine (ADRENALIN) 1 MG/ML injection Inject 1 mg as directed as needed for anaphylaxis. Reported on 02/01/2016    . ezetimibe (ZETIA) 10 MG tablet Take 1 tablet (10 mg total) by mouth daily. 30 tablet 6  . lidocaine-prilocaine (EMLA) cream Apply 1 application topically as needed. 30 g 6  . Lysine 500 MG CAPS Take 1 capsule by mouth 2 (two) times daily as needed (fever blisters).     Marland Kitchen  meclizine (ANTIVERT) 25 MG tablet Take 25 mg by mouth daily as needed for dizziness.     . Multiple Vitamins-Minerals (CENTRUM SILVER 50+WOMEN PO) Take 1 tablet by mouth daily.    . nitroGLYCERIN (NITROSTAT) 0.4 MG SL tablet Place 0.4 mg under the tongue every 5 (five) minutes as needed for chest pain. Reported on 02/01/2016    . Omega 3 1000 MG  CAPS Take 1 each by mouth daily.     Marland Kitchen omeprazole (PRILOSEC) 40 MG capsule Take 40 mg by mouth daily.    . Prenatal Vit-Fe Fumarate-FA (PRENATAL VITAMIN PO) Take 1 tablet by mouth 2 (two) times daily.    . psyllium (METAMUCIL) 58.6 % powder Take 1 packet by mouth daily.    . rosuvastatin (CRESTOR) 10 MG tablet Take 10 mg by mouth daily.  3  . sotalol (BETAPACE) 160 MG tablet TAKE 1 TABLET (160 MG TOTAL) BY MOUTH 2 (TWO) TIMES DAILY. 60 tablet 10  . triamcinolone (NASACORT) 55 MCG/ACT AERO nasal inhaler Place 2 sprays into the nose 2 (two) times daily. 1 Inhaler 12   No current facility-administered medications for this visit.    Facility-Administered Medications Ordered in Other Visits  Medication Dose Route Frequency Provider Last Rate Last Dose  . sodium chloride flush (NS) 0.9 % injection 10 mL  10 mL Intravenous PRN Alvy Bimler, Ebert Forrester, MD   10 mL at 09/22/17 1322     REVIEW OF SYSTEMS:   Constitutional: Denies fevers, chills or night sweats Eyes: Denies blurriness of vision Ears, nose, mouth, throat, and face: Denies mucositis or sore throat Respiratory: Denies cough, dyspnea or wheezes Cardiovascular: Denies palpitation, chest discomfort or lower extremity swelling Gastrointestinal:  Denies nausea, heartburn or change in bowel habits Skin: Denies abnormal skin rashes Lymphatics: Denies new lymphadenopathy or easy bruising Neurological:Denies numbness, tingling or new weaknesses Behavioral/Psych: Mood is stable, no new changes  All other systems were reviewed with the patient and are negative.  PHYSICAL EXAMINATION: ECOG PERFORMANCE STATUS: 1 - Symptomatic but completely ambulatory  Vitals:   05/07/19 1039  BP: (!) 126/47  Pulse: (!) 56  Resp: 18  Temp: 97.8 F (36.6 C)  SpO2: 100%   Filed Weights   05/07/19 1039  Weight: 138 lb 9.6 oz (62.9 kg)    GENERAL:alert, no distress and comfortable SKIN: skin color, texture, turgor are normal, no rashes or significant  lesions EYES: normal, Conjunctiva are pink and non-injected, sclera clear OROPHARYNX:no exudate, no erythema and lips, buccal mucosa, and tongue normal  NECK: supple, thyroid normal size, non-tender, without nodularity LYMPH:  no palpable lymphadenopathy in the cervical, axillary or inguinal LUNGS: clear to auscultation and percussion with normal breathing effort HEART: regular rate & rhythm with soft systolic murmur on the left border murmurs and no lower extremity edema ABDOMEN:abdomen soft, non-tender and normal bowel sounds Musculoskeletal:no cyanosis of digits and no clubbing  NEURO: alert but not fully oriented x 3 with fluent speech, no focal motor/sensory deficits  LABORATORY DATA:  I have reviewed the data as listed     Component Value Date/Time   NA 137 07/11/2017 0933   NA 142 05/05/2017 0850   K 4.3 07/11/2017 0933   K 4.0 05/05/2017 0850   CL 104 07/11/2017 0933   CL 104 08/02/2012 1515   CO2 24 07/11/2017 0933   CO2 25 05/05/2017 0850   GLUCOSE 110 (H) 07/11/2017 0933   GLUCOSE 130 05/05/2017 0850   GLUCOSE 105 (H) 08/02/2012 1515   BUN 17 07/11/2017  0933   BUN 18.9 05/05/2017 0850   CREATININE 0.97 07/11/2017 0933   CREATININE 0.9 05/05/2017 0850   CALCIUM 9.7 07/11/2017 0933   CALCIUM 10.0 05/05/2017 0850   PROT 6.9 05/05/2017 0850   ALBUMIN 3.8 05/05/2017 0850   AST 19 05/05/2017 0850   ALT 14 05/05/2017 0850   ALKPHOS 54 05/05/2017 0850   BILITOT 0.48 05/05/2017 0850   GFRNONAA 53 (L) 07/11/2017 0933   GFRAA >60 07/11/2017 0933    No results found for: SPEP, UPEP  Lab Results  Component Value Date   WBC 7.8 05/07/2019   NEUTROABS 4.7 05/07/2019   HGB 9.6 (L) 05/07/2019   HCT 30.2 (L) 05/07/2019   MCV 89.9 05/07/2019   PLT 42 (L) 05/07/2019      Chemistry      Component Value Date/Time   NA 137 07/11/2017 0933   NA 142 05/05/2017 0850   K 4.3 07/11/2017 0933   K 4.0 05/05/2017 0850   CL 104 07/11/2017 0933   CL 104 08/02/2012 1515   CO2  24 07/11/2017 0933   CO2 25 05/05/2017 0850   BUN 17 07/11/2017 0933   BUN 18.9 05/05/2017 0850   CREATININE 0.97 07/11/2017 0933   CREATININE 0.9 05/05/2017 0850      Component Value Date/Time   CALCIUM 9.7 07/11/2017 0933   CALCIUM 10.0 05/05/2017 0850   ALKPHOS 54 05/05/2017 0850   AST 19 05/05/2017 0850   ALT 14 05/05/2017 0850   BILITOT 0.48 05/05/2017 0850       I spent 15 minutes counseling the patient face to face. The total time spent in the appointment was 20 minutes and more than 50% was on counseling.   All questions were answered. The patient knows to call the clinic with any problems, questions or concerns. No barriers to learning was detected.    Heath Lark, MD 8/18/202012:35 PM

## 2019-05-07 NOTE — Assessment & Plan Note (Signed)
The patient has dementia and is dependent on her husband for all medical decisions I reviewed the plan of care with the husband and we discussed visitor policy if she were to return here for future IVIG treatment For now, we will try to get her IVIG infusion at home

## 2019-05-07 NOTE — Assessment & Plan Note (Signed)
She has worsening thrombocytopenia since we discontinued IVIG I reviewed the plan of care with her husband We will try to get home IVIG infusion set up If not possible, we will bring her back to the infusion room for repeat IVIG treatment

## 2019-05-10 ENCOUNTER — Inpatient Hospital Stay: Payer: Medicare HMO | Admitting: Hematology and Oncology

## 2019-05-10 ENCOUNTER — Inpatient Hospital Stay: Payer: Medicare HMO

## 2019-05-13 ENCOUNTER — Telehealth: Payer: Self-pay | Admitting: Hematology and Oncology

## 2019-05-13 ENCOUNTER — Telehealth: Payer: Self-pay

## 2019-05-13 NOTE — Telephone Encounter (Signed)
Scheduled appt per 8/24 sch message - pt husband aware of apt date and time

## 2019-05-13 NOTE — Telephone Encounter (Signed)
Called husband back after he left a message to him. Offered appt for lab, Infusion for IVIG and appt with Dr. Alvy Bimler for 8/31. He is agreeable to appt. Scheduling message sent.

## 2019-05-13 NOTE — Telephone Encounter (Signed)
Called and left a message asking [name] or her husband to call the office back regarding scheduling appts.

## 2019-05-20 ENCOUNTER — Other Ambulatory Visit: Payer: Self-pay

## 2019-05-20 ENCOUNTER — Inpatient Hospital Stay: Payer: Medicare HMO

## 2019-05-20 ENCOUNTER — Inpatient Hospital Stay (HOSPITAL_BASED_OUTPATIENT_CLINIC_OR_DEPARTMENT_OTHER): Payer: Medicare HMO | Admitting: Hematology and Oncology

## 2019-05-20 ENCOUNTER — Other Ambulatory Visit: Payer: Self-pay | Admitting: Hematology and Oncology

## 2019-05-20 VITALS — BP 131/66 | HR 58 | Temp 98.4°F | Resp 18

## 2019-05-20 DIAGNOSIS — Z95828 Presence of other vascular implants and grafts: Secondary | ICD-10-CM

## 2019-05-20 DIAGNOSIS — D693 Immune thrombocytopenic purpura: Secondary | ICD-10-CM

## 2019-05-20 DIAGNOSIS — D539 Nutritional anemia, unspecified: Secondary | ICD-10-CM

## 2019-05-20 DIAGNOSIS — R69 Illness, unspecified: Secondary | ICD-10-CM | POA: Diagnosis not present

## 2019-05-20 DIAGNOSIS — R413 Other amnesia: Secondary | ICD-10-CM

## 2019-05-20 DIAGNOSIS — K649 Unspecified hemorrhoids: Secondary | ICD-10-CM | POA: Diagnosis not present

## 2019-05-20 DIAGNOSIS — D696 Thrombocytopenia, unspecified: Secondary | ICD-10-CM | POA: Diagnosis not present

## 2019-05-20 DIAGNOSIS — Z79899 Other long term (current) drug therapy: Secondary | ICD-10-CM | POA: Diagnosis not present

## 2019-05-20 DIAGNOSIS — D649 Anemia, unspecified: Secondary | ICD-10-CM | POA: Diagnosis not present

## 2019-05-20 LAB — CBC WITH DIFFERENTIAL/PLATELET
Abs Immature Granulocytes: 0.03 10*3/uL (ref 0.00–0.07)
Basophils Absolute: 0 10*3/uL (ref 0.0–0.1)
Basophils Relative: 0 %
Eosinophils Absolute: 0.1 10*3/uL (ref 0.0–0.5)
Eosinophils Relative: 1 %
HCT: 28 % — ABNORMAL LOW (ref 36.0–46.0)
Hemoglobin: 9 g/dL — ABNORMAL LOW (ref 12.0–15.0)
Immature Granulocytes: 0 %
Lymphocytes Relative: 24 %
Lymphs Abs: 2 10*3/uL (ref 0.7–4.0)
MCH: 28.3 pg (ref 26.0–34.0)
MCHC: 32.1 g/dL (ref 30.0–36.0)
MCV: 88.1 fL (ref 80.0–100.0)
Monocytes Absolute: 0.8 10*3/uL (ref 0.1–1.0)
Monocytes Relative: 10 %
Neutro Abs: 5.3 10*3/uL (ref 1.7–7.7)
Neutrophils Relative %: 65 %
Platelets: 47 10*3/uL — ABNORMAL LOW (ref 150–400)
RBC: 3.18 MIL/uL — ABNORMAL LOW (ref 3.87–5.11)
RDW: 13.8 % (ref 11.5–15.5)
WBC: 8.2 10*3/uL (ref 4.0–10.5)
nRBC: 0 % (ref 0.0–0.2)

## 2019-05-20 LAB — COMPREHENSIVE METABOLIC PANEL
ALT: 12 U/L (ref 0–44)
AST: 16 U/L (ref 15–41)
Albumin: 3.7 g/dL (ref 3.5–5.0)
Alkaline Phosphatase: 52 U/L (ref 38–126)
Anion gap: 9 (ref 5–15)
BUN: 21 mg/dL (ref 8–23)
CO2: 25 mmol/L (ref 22–32)
Calcium: 9.1 mg/dL (ref 8.9–10.3)
Chloride: 107 mmol/L (ref 98–111)
Creatinine, Ser: 0.95 mg/dL (ref 0.44–1.00)
GFR calc Af Amer: >60 mL/min (ref >60)
GFR calc non Af Amer: 55 mL/min — ABNORMAL LOW (ref >60)
Glucose, Bld: 181 mg/dL — ABNORMAL HIGH (ref 70–99)
Potassium: 4 mmol/L (ref 3.5–5.1)
Sodium: 141 mmol/L (ref 135–145)
Total Bilirubin: 0.3 mg/dL (ref 0.3–1.2)
Total Protein: 6.7 g/dL (ref 6.5–8.1)

## 2019-05-20 LAB — IRON AND TIBC
Iron: 64 ug/dL (ref 41–142)
Saturation Ratios: 19 % — ABNORMAL LOW (ref 21–57)
TIBC: 346 ug/dL (ref 236–444)
UIBC: 282 ug/dL (ref 120–384)

## 2019-05-20 LAB — FERRITIN: Ferritin: 14 ng/mL (ref 11–307)

## 2019-05-20 LAB — SEDIMENTATION RATE: Sed Rate: 26 mm/hr — ABNORMAL HIGH (ref 0–22)

## 2019-05-20 LAB — DIRECT ANTIGLOBULIN TEST (NOT AT ARMC)
DAT, IgG: NEGATIVE
DAT, complement: NEGATIVE

## 2019-05-20 LAB — VITAMIN B12: Vitamin B-12: 527 pg/mL (ref 180–914)

## 2019-05-20 MED ORDER — SODIUM CHLORIDE 0.9% FLUSH
10.0000 mL | Freq: Once | INTRAVENOUS | Status: AC
  Administered 2019-05-20: 16:00:00 10 mL via INTRAVENOUS
  Filled 2019-05-20: qty 10

## 2019-05-20 MED ORDER — DIPHENHYDRAMINE HCL 25 MG PO CAPS
ORAL_CAPSULE | ORAL | Status: AC
  Filled 2019-05-20: qty 1

## 2019-05-20 MED ORDER — ACETAMINOPHEN 325 MG PO TABS
ORAL_TABLET | ORAL | Status: AC
  Filled 2019-05-20: qty 2

## 2019-05-20 MED ORDER — DIPHENHYDRAMINE HCL 25 MG PO TABS
25.0000 mg | ORAL_TABLET | Freq: Once | ORAL | Status: AC
  Administered 2019-05-20: 10:00:00 25 mg via ORAL
  Filled 2019-05-20: qty 1

## 2019-05-20 MED ORDER — ACETAMINOPHEN 325 MG PO TABS
650.0000 mg | ORAL_TABLET | Freq: Once | ORAL | Status: AC
  Administered 2019-05-20: 10:00:00 650 mg via ORAL

## 2019-05-20 MED ORDER — DEXTROSE 5 % IV SOLN
Freq: Once | INTRAVENOUS | Status: AC
  Administered 2019-05-20: 10:00:00 via INTRAVENOUS
  Filled 2019-05-20: qty 250

## 2019-05-20 MED ORDER — SODIUM CHLORIDE 0.9% FLUSH
10.0000 mL | Freq: Once | INTRAVENOUS | Status: AC
  Administered 2019-05-20: 10 mL
  Filled 2019-05-20: qty 10

## 2019-05-20 MED ORDER — HEPARIN SOD (PORK) LOCK FLUSH 100 UNIT/ML IV SOLN
250.0000 [IU] | Freq: Once | INTRAVENOUS | Status: DC
  Filled 2019-05-20: qty 5

## 2019-05-20 MED ORDER — FAMOTIDINE 20 MG PO TABS
20.0000 mg | ORAL_TABLET | Freq: Once | ORAL | Status: AC
  Administered 2019-05-20: 10:00:00 20 mg via ORAL

## 2019-05-20 MED ORDER — FAMOTIDINE 20 MG PO TABS
ORAL_TABLET | ORAL | Status: AC
  Filled 2019-05-20: qty 1

## 2019-05-20 MED ORDER — IMMUNE GLOBULIN (HUMAN) 20 GM/200ML IV SOLN
60.0000 g | Freq: Once | INTRAVENOUS | Status: AC
  Administered 2019-05-20: 11:00:00 60 g via INTRAVENOUS
  Filled 2019-05-20: qty 600

## 2019-05-20 MED ORDER — HEPARIN SOD (PORK) LOCK FLUSH 100 UNIT/ML IV SOLN
500.0000 [IU] | Freq: Once | INTRAVENOUS | Status: AC
  Administered 2019-05-20: 16:00:00 500 [IU] via INTRAVENOUS
  Filled 2019-05-20: qty 5

## 2019-05-20 NOTE — Progress Notes (Signed)
Pt and husband refused to stay 30 minutes post IVIG infusion.  RN educated pt on signs of allergic reaction.  Pt discharged home with husband.

## 2019-05-20 NOTE — Patient Instructions (Signed)
Immune Globulin Injection What is this medicine? IMMUNE GLOBULIN (im MUNE GLOB yoo lin) helps to prevent or reduce the severity of certain infections in patients who are at risk. This medicine is collected from the pooled blood of many donors. It is used to treat immune system problems, thrombocytopenia, and Kawasaki syndrome. This medicine may be used for other purposes; ask your health care provider or pharmacist if you have questions. COMMON BRAND NAME(S): ASCENIV, Baygam, BIVIGAM, Carimune, Carimune NF, cutaquig, Cuvitru, Flebogamma, Flebogamma DIF, GamaSTAN, GamaSTAN S/D, Gamimune N, Gammagard, Gammagard S/D, Gammaked, Gammaplex, Gammar-P IV, Gamunex, Gamunex-C, Hizentra, Iveegam, Iveegam EN, Octagam, Panglobulin, Panglobulin NF, panzyga, Polygam S/D, Privigen, Sandoglobulin, Venoglobulin-S, Vigam, Vivaglobulin, Xembify What should I tell my health care provider before I take this medicine? They need to know if you have any of these conditions:   diabetes   extremely low or no immune antibodies in the blood   heart disease   history of blood clots   hyperprolinemia   infection in the blood, sepsis   kidney disease   taking medicine that may change kidney function - ask your health care provider about your medicine   an unusual or allergic reaction to human immune globulin, albumin, maltose, sucrose, polysorbate 80, other medicines, foods, dyes, or preservatives   pregnant or trying to get pregnant   breast-feeding How should I use this medicine? This medicine is for injection into a muscle or infusion into a vein or skin. It is usually given by a health care professional in a hospital or clinic setting. In rare cases, some brands of this medicine might be given at home. You will be taught how to give this medicine. Use exactly as directed. Take your medicine at regular intervals. Do not take your medicine more often than directed. Talk to your pediatrician regarding  the use of this medicine in children. Special care may be needed. Overdosage: If you think you have taken too much of this medicine contact a poison control center or emergency room at once. NOTE: This medicine is only for you. Do not share this medicine with others. What if I miss a dose? It is important not to miss your dose. Call your doctor or health care professional if you are unable to keep an appointment. If you give yourself the medicine and you miss a dose, take it as soon as you can. If it is almost time for your next dose, take only that dose. Do not take double or extra doses. What may interact with this medicine?  aspirin and aspirin-like medicines  cisplatin  cyclosporine  medicines for infection like acyclovir, adefovir, amphotericin B, bacitracin, cidofovir, foscarnet, ganciclovir, gentamicin, pentamidine, vancomycin  NSAIDS, medicines for pain and inflammation, like ibuprofen or naproxen  pamidronate  vaccines  zoledronic acid This list may not describe all possible interactions. Give your health care provider a list of all the medicines, herbs, non-prescription drugs, or dietary supplements you use. Also tell them if you smoke, drink alcohol, or use illegal drugs. Some items may interact with your medicine. What should I watch for while using this medicine? Your condition will be monitored carefully while you are receiving this medicine. This medicine is made from pooled blood donations of many different people. It may be possible to pass an infection in this medicine. However, the donors are screened for infections and all products are tested for HIV and hepatitis. The medicine is treated to kill most or all bacteria and viruses. Talk to your doctor about   the risks and benefits of this medicine. Do not have vaccinations for at least 14 days before, or until at least 3 months after receiving this medicine. What side effects may I notice from receiving this  medicine? Side effects that you should report to your doctor or health care professional as soon as possible:  allergic reactions like skin rash, itching or hives, swelling of the face, lips, or tongue  breathing problems  chest pain or tightness  fever, chills  headache with nausea, vomiting  neck pain or difficulty moving neck  pain when moving eyes  pain, swelling, warmth in the leg  problems with balance, talking, walking  sudden weight gain  swelling of the ankles, feet, hands  trouble passing urine or change in the amount of urine Side effects that usually do not require medical attention (report to your doctor or health care professional if they continue or are bothersome):  dizzy, drowsy  flushing  increased sweating  leg cramps  muscle aches and pains  pain at site where injected This list may not describe all possible side effects. Call your doctor for medical advice about side effects. You may report side effects to FDA at 1-800-FDA-1088. Where should I keep my medicine? Keep out of the reach of children. This drug is usually given in a hospital or clinic and will not be stored at home. In rare cases, some brands of this medicine may be given at home. If you are using this medicine at home, you will be instructed on how to store this medicine. Throw away any unused medicine after the expiration date on the label. NOTE: This sheet is a summary. It may not cover all possible information. If you have questions about this medicine, talk to your doctor, pharmacist, or health care provider.  2020 Elsevier/Gold Standard (2008-11-26 11:44:49)  

## 2019-05-20 NOTE — Patient Instructions (Signed)

## 2019-05-21 ENCOUNTER — Encounter: Payer: Self-pay | Admitting: Hematology and Oncology

## 2019-05-21 LAB — FOLATE RBC
Folate, Hemolysate: >620 ng/mL
Folate, RBC: >2199 ng/mL (ref >498)
Hematocrit: 28.2 % — ABNORMAL LOW (ref 34.0–46.6)

## 2019-05-21 LAB — ERYTHROPOIETIN: Erythropoietin: 43.5 m[IU]/mL — ABNORMAL HIGH (ref 2.6–18.5)

## 2019-05-21 NOTE — Assessment & Plan Note (Signed)
She has history of brain injury causing memory loss She is not able to make medical decision and defer to her husband for all decision making.

## 2019-05-21 NOTE — Assessment & Plan Note (Signed)
She has ongoing hemorrhoidal bleeding causing iron deficiency She does not need transfusion support right now I recommend oral iron supplement

## 2019-05-21 NOTE — Progress Notes (Signed)
[facility] OFFICE PROGRESS NOTE  [name], MD  ASSESSMENT & PLAN:  Idiopathic thrombocytopenic purpura ([place]) She has worsening thrombocytopenia since we discontinued IVIG I reviewed the plan of care with her husband We attempted to set her IVIG at home but I was not able to get insurance prior authorization We will proceed with IVIG at the cancer center We discussed long-term treatment options, which include prednisone therapy, monthly IVIG, transfusion support or growth factor injections with Nplate or Promacta I reviewed the risks, benefits, side effects of each treatment option. Previously, she responded well to prednisone but could not tolerate side effects She did well with monthly IVIG treatment but only if her husband is present at all times.  I think it is unrealistic to try to arrange for her husband to be present for the entire 6 hours of monthly treatment given COVID restriction.  The reason her husband has to be present is because of her baseline dementia. We discussed transfusion only if platelet count drops to less than 10,000 or bleeding. The last option would include repeat baseline bone marrow aspirate and biopsy. Her husband is undecided.  I will call him to follow-up on his final decision  Bleeding hemorrhoid She has ongoing hemorrhoidal bleeding causing iron deficiency She does not need transfusion support right now I recommend oral iron supplement  Poor memory She has history of brain injury causing memory loss She is not able to make medical decision and defer to her husband for all decision making.   No orders of the defined types were placed in this encounter.   INTERVAL HISTORY: [name] 85 y.o. female returns for further follow-up and IVIG infusion Her husband was present throughout the encounter She continues to have intermittent bleeding hemorrhoid She complained of mild fatigue No other signs of bleeding such as epistaxis  or hematuria She denies recent chest pain or shortness of breath  SUMMARY OF HEMATOLOGIC HISTORY:  This is a pleasant lady with chronic thrombocytopenia. The patient was discovered to have low platelet count when she was placed on Plavix for coronary artery disease. 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 She is being observed. On 01/04/2016, she was started on prednisone therapy for ITP when her platelet count dip under  50,000 May 2017, she tolerated prednisone poorly and prednisone is slowly tapered off 05/05/2017, she had relapsed ITP and started on IVIG with complete response to Rx but with platelet count trending down after 4 weeks. She is then scheduled for IVIG every 3-4 weeks In 2020, her treatment was placed on hold due to COVID restriction, resume on May 20, 2019  I have reviewed the past medical history, past surgical history, social history and family history with the patient and they are unchanged from previous note.  ALLERGIES:  is allergic to bee venom; iodides; lyrica [pregabalin]; codeine; demerol [meperidine]; ambien [zolpidem tartrate]; aspirin; celebrex [celecoxib]; darvocet [propoxyphene n-acetaminophen]; fosamax [alendronate sodium]; lipitor [atorvastatin]; morphine and related; valium [diazepam]; and iodine.  MEDICATIONS:  Current Outpatient Medications  Medication Sig Dispense Refill  . amLODipine (NORVASC) 5 MG tablet Take 1 tablet (5 mg total) by mouth daily. 90 tablet 3  . Bacillus Coagulans-Inulin (ALIGN PREBIOTIC-PROBIOTIC) 5-1.25 MG-GM CHEW Chew 1 tablet by mouth 2 (two) times daily.    . calcium citrate (CALCITRATE - DOSED IN MG ELEMENTAL CALCIUM)  950 MG tablet Take 1 tablet by mouth daily.    . cetirizine (ZYRTEC) 10 MG tablet Take 10 mg by mouth daily.    . Cholecalciferol (VITAMIN  D-3 PO) Take 1 tablet by mouth daily.    Marland Kitchen EPINEPHrine (ADRENALIN) 1 MG/ML injection Inject 1 mg as directed as needed for anaphylaxis. Reported on 02/01/2016    . ezetimibe (ZETIA) 10 MG tablet Take 1 tablet (10 mg total) by mouth daily. 30 tablet 6  . lidocaine-prilocaine (EMLA) cream Apply 1 application topically as needed. 30 g 6  . Lysine 500 MG CAPS Take 1 capsule by mouth 2 (two) times daily as needed (fever blisters).     . meclizine (ANTIVERT) 25 MG tablet Take 25 mg by mouth daily as needed for dizziness.     . Multiple Vitamins-Minerals (CENTRUM SILVER 50+WOMEN PO) Take 1 tablet by mouth daily.    . nitroGLYCERIN (NITROSTAT) 0.4 MG SL tablet Place 0.4 mg under the tongue every 5 (five) minutes as needed for chest pain. Reported on 02/01/2016    . Omega 3 1000 MG CAPS Take 1 each by mouth daily.     Marland Kitchen omeprazole (PRILOSEC) 40 MG capsule Take 40 mg by mouth daily.    . Prenatal Vit-Fe Fumarate-FA (PRENATAL VITAMIN PO) Take 1 tablet by mouth 2 (two) times daily.    . psyllium (METAMUCIL) 58.6 % powder Take 1 packet by mouth daily.    . rosuvastatin (CRESTOR) 10 MG tablet Take 10 mg by mouth daily.  3  . sotalol (BETAPACE) 160 MG tablet TAKE 1 TABLET (160 MG TOTAL) BY MOUTH 2 (TWO) TIMES DAILY. 60 tablet 10  . triamcinolone (NASACORT) 55 MCG/ACT AERO nasal inhaler Place 2 sprays into the nose 2 (two) times daily. 1 Inhaler 12   No current facility-administered medications for this visit.    Facility-Administered Medications Ordered in Other Visits  Medication Dose Route Frequency Provider Last Rate Last Dose  . sodium chloride flush (NS) 0.9 % injection 10 mL  10 mL Intravenous PRN Alvy Bimler, Tahlor Berenguer, MD   10 mL at 09/22/17 1322     REVIEW OF SYSTEMS:   Constitutional: Denies fevers, chills or night sweats Eyes: Denies blurriness of vision Ears, nose, mouth, throat, and face: Denies mucositis or sore throat Respiratory: Denies cough, dyspnea or wheezes Cardiovascular: Denies palpitation,  chest discomfort or lower extremity swelling Gastrointestinal:  Denies nausea, heartburn or change in bowel habits Skin: Denies abnormal skin rashes Lymphatics: Denies new lymphadenopathy or easy bruising Neurological:Denies numbness, tingling or new weaknesses Behavioral/Psych: Mood is stable, no new changes  All other systems were reviewed with the patient and are negative.  PHYSICAL EXAMINATION: ECOG PERFORMANCE STATUS: 1 - Symptomatic but completely ambulatory GENERAL:alert, no distress and comfortable.  She looks pale SKIN: skin color, texture, turgor are normal, no rashes or significant lesions EYES: normal, Conjunctiva are pink and non-injected, sclera clear OROPHARYNX:no exudate, no erythema and lips, buccal mucosa, and tongue normal  NECK: supple, thyroid normal size, non-tender, without nodularity LYMPH:  no palpable lymphadenopathy in the cervical, axillary or inguinal LUNGS: clear to auscultation and percussion with normal breathing effort HEART: regular rate & rhythm with soft systolic murmur and no lower extremity edema ABDOMEN:abdomen soft, non-tender and normal bowel sounds Musculoskeletal:no cyanosis of digits and no clubbing  NEURO: alert & oriented x 3 with fluent speech, no focal motor/sensory deficits  LABORATORY DATA:  I have reviewed the data as listed     Component Value Date/Time   NA  141 05/20/2019 0945   NA 142 05/05/2017 0850   K 4.0 05/20/2019 0945   K 4.0 05/05/2017 0850   CL 107 05/20/2019 0945   CL 104 08/02/2012 1515   CO2 25 05/20/2019 0945   CO2 25 05/05/2017 0850   GLUCOSE 181 (H) 05/20/2019 0945   GLUCOSE 130 05/05/2017 0850   GLUCOSE 105 (H) 08/02/2012 1515   BUN 21 05/20/2019 0945   BUN 18.9 05/05/2017 0850   CREATININE 0.95 05/20/2019 0945   CREATININE 0.9 05/05/2017 0850   CALCIUM 9.1 05/20/2019 0945   CALCIUM 10.0 05/05/2017 0850   PROT 6.7 05/20/2019 0945   PROT 6.9 05/05/2017 0850   ALBUMIN 3.7 05/20/2019 0945   ALBUMIN 3.8  05/05/2017 0850   AST 16 05/20/2019 0945   AST 19 05/05/2017 0850   ALT 12 05/20/2019 0945   ALT 14 05/05/2017 0850   ALKPHOS 52 05/20/2019 0945   ALKPHOS 54 05/05/2017 0850   BILITOT 0.3 05/20/2019 0945   BILITOT 0.48 05/05/2017 0850   GFRNONAA 55 (L) 05/20/2019 0945   GFRAA >60 05/20/2019 0945    No results found for: SPEP, UPEP  Lab Results  Component Value Date   WBC 8.2 05/20/2019   NEUTROABS 5.3 05/20/2019   HGB 9.0 (L) 05/20/2019   HCT 28.0 (L) 05/20/2019   MCV 88.1 05/20/2019   PLT 47 (L) 05/20/2019      Chemistry      Component Value Date/Time   NA 141 05/20/2019 0945   NA 142 05/05/2017 0850   K 4.0 05/20/2019 0945   K 4.0 05/05/2017 0850   CL 107 05/20/2019 0945   CL 104 08/02/2012 1515   CO2 25 05/20/2019 0945   CO2 25 05/05/2017 0850   BUN 21 05/20/2019 0945   BUN 18.9 05/05/2017 0850   CREATININE 0.95 05/20/2019 0945   CREATININE 0.9 05/05/2017 0850      Component Value Date/Time   CALCIUM 9.1 05/20/2019 0945   CALCIUM 10.0 05/05/2017 0850   ALKPHOS 52 05/20/2019 0945   ALKPHOS 54 05/05/2017 0850   AST 16 05/20/2019 0945   AST 19 05/05/2017 0850   ALT 12 05/20/2019 0945   ALT 14 05/05/2017 0850   BILITOT 0.3 05/20/2019 0945   BILITOT 0.48 05/05/2017 0850      I spent 15 minutes counseling the patient face to face. The total time spent in the appointment was 20 minutes and more than 50% was on counseling.   All questions were answered. The patient knows to call the clinic with any problems, questions or concerns. No barriers to learning was detected.    Heath Lark, MD 9/1/20208:53 AM

## 2019-05-21 NOTE — Assessment & Plan Note (Signed)
She has worsening thrombocytopenia since we discontinued IVIG I reviewed the plan of care with her husband We attempted to set her IVIG at home but I was not able to get insurance prior authorization We will proceed with IVIG at the cancer center We discussed long-term treatment options, which include prednisone therapy, monthly IVIG, transfusion support or growth factor injections with Nplate or Promacta I reviewed the risks, benefits, side effects of each treatment option. Previously, she responded well to prednisone but could not tolerate side effects She did well with monthly IVIG treatment but only if her husband is present at all times.  I think it is unrealistic to try to arrange for her husband to be present for the entire 6 hours of monthly treatment given COVID restriction.  The reason her husband has to be present is because of her baseline dementia. We discussed transfusion only if platelet count drops to less than 10,000 or bleeding. The last option would include repeat baseline bone marrow aspirate and biopsy. Her husband is undecided.  I will call him to follow-up on his final decision

## 2019-05-22 ENCOUNTER — Telehealth: Payer: Self-pay | Admitting: Hematology and Oncology

## 2019-05-22 ENCOUNTER — Telehealth: Payer: Self-pay | Admitting: *Deleted

## 2019-05-22 NOTE — Telephone Encounter (Signed)
Attempted to call the patient several times yesterday and today but unable to get through I will get my staff to call and if still unable to reach the patient, we will send a letter

## 2019-05-22 NOTE — Telephone Encounter (Signed)
Attempted to reach patient or patient's husband by phone a few times today- telephone rings and then the busy signal. No way to leave a message or voicemail.

## 2019-05-23 ENCOUNTER — Encounter: Payer: Self-pay | Admitting: Hematology and Oncology

## 2019-05-31 ENCOUNTER — Telehealth: Payer: Self-pay | Admitting: Hematology and Oncology

## 2019-05-31 NOTE — Telephone Encounter (Signed)
I spoke with her husband over the telephone He wants the patient to resume IVIG treatment monthly I will schedule her next appointment on September 28

## 2019-06-04 ENCOUNTER — Telehealth: Payer: Self-pay | Admitting: Hematology and Oncology

## 2019-06-04 NOTE — Telephone Encounter (Signed)
Scheduled appt per  9/11 sch message - unable to reach pt . Left message with appt date and time   

## 2019-06-17 ENCOUNTER — Inpatient Hospital Stay: Payer: Medicare HMO

## 2019-06-17 ENCOUNTER — Inpatient Hospital Stay (HOSPITAL_BASED_OUTPATIENT_CLINIC_OR_DEPARTMENT_OTHER): Payer: Medicare HMO | Admitting: Hematology and Oncology

## 2019-06-17 ENCOUNTER — Encounter: Payer: Self-pay | Admitting: Hematology and Oncology

## 2019-06-17 ENCOUNTER — Other Ambulatory Visit: Payer: Self-pay

## 2019-06-17 ENCOUNTER — Inpatient Hospital Stay: Payer: Medicare HMO | Attending: Hematology and Oncology

## 2019-06-17 VITALS — BP 129/65 | HR 55 | Temp 98.5°F | Resp 16

## 2019-06-17 DIAGNOSIS — D693 Immune thrombocytopenic purpura: Secondary | ICD-10-CM

## 2019-06-17 DIAGNOSIS — K649 Unspecified hemorrhoids: Secondary | ICD-10-CM

## 2019-06-17 DIAGNOSIS — Z79899 Other long term (current) drug therapy: Secondary | ICD-10-CM | POA: Diagnosis not present

## 2019-06-17 DIAGNOSIS — D649 Anemia, unspecified: Secondary | ICD-10-CM | POA: Diagnosis not present

## 2019-06-17 DIAGNOSIS — Z95828 Presence of other vascular implants and grafts: Secondary | ICD-10-CM

## 2019-06-17 DIAGNOSIS — R413 Other amnesia: Secondary | ICD-10-CM | POA: Diagnosis not present

## 2019-06-17 LAB — CBC WITH DIFFERENTIAL/PLATELET
Abs Immature Granulocytes: 0.01 10*3/uL (ref 0.00–0.07)
Basophils Absolute: 0 10*3/uL (ref 0.0–0.1)
Basophils Relative: 0 %
Eosinophils Absolute: 0.1 10*3/uL (ref 0.0–0.5)
Eosinophils Relative: 2 %
HCT: 31.1 % — ABNORMAL LOW (ref 36.0–46.0)
Hemoglobin: 9.8 g/dL — ABNORMAL LOW (ref 12.0–15.0)
Immature Granulocytes: 0 %
Lymphocytes Relative: 27 %
Lymphs Abs: 1.7 10*3/uL (ref 0.7–4.0)
MCH: 28.4 pg (ref 26.0–34.0)
MCHC: 31.5 g/dL (ref 30.0–36.0)
MCV: 90.1 fL (ref 80.0–100.0)
Monocytes Absolute: 0.7 10*3/uL (ref 0.1–1.0)
Monocytes Relative: 11 %
Neutro Abs: 3.9 10*3/uL (ref 1.7–7.7)
Neutrophils Relative %: 60 %
Platelets: 98 10*3/uL — ABNORMAL LOW (ref 150–400)
RBC: 3.45 MIL/uL — ABNORMAL LOW (ref 3.87–5.11)
RDW: 14.6 % (ref 11.5–15.5)
WBC: 6.4 10*3/uL (ref 4.0–10.5)
nRBC: 0 % (ref 0.0–0.2)

## 2019-06-17 MED ORDER — HEPARIN SOD (PORK) LOCK FLUSH 100 UNIT/ML IV SOLN
500.0000 [IU] | Freq: Once | INTRAVENOUS | Status: AC
  Administered 2019-06-17: 500 [IU]
  Filled 2019-06-17: qty 5

## 2019-06-17 MED ORDER — DIPHENHYDRAMINE HCL 25 MG PO TABS
25.0000 mg | ORAL_TABLET | Freq: Once | ORAL | Status: AC
  Administered 2019-06-17: 25 mg via ORAL
  Filled 2019-06-17: qty 1

## 2019-06-17 MED ORDER — ACETAMINOPHEN 325 MG PO TABS
ORAL_TABLET | ORAL | Status: AC
  Filled 2019-06-17: qty 2

## 2019-06-17 MED ORDER — FAMOTIDINE 20 MG PO TABS
ORAL_TABLET | ORAL | Status: AC
  Filled 2019-06-17: qty 1

## 2019-06-17 MED ORDER — DIPHENHYDRAMINE HCL 25 MG PO CAPS
ORAL_CAPSULE | ORAL | Status: AC
  Filled 2019-06-17: qty 1

## 2019-06-17 MED ORDER — IMMUNE GLOBULIN (HUMAN) 10 GM/100ML IV SOLN
55.0000 g | Freq: Once | INTRAVENOUS | Status: AC
  Administered 2019-06-17: 55 g via INTRAVENOUS
  Filled 2019-06-17: qty 100

## 2019-06-17 MED ORDER — FAMOTIDINE 20 MG PO TABS
20.0000 mg | ORAL_TABLET | Freq: Once | ORAL | Status: AC
  Administered 2019-06-17: 20 mg via ORAL

## 2019-06-17 MED ORDER — ACETAMINOPHEN 325 MG PO TABS
650.0000 mg | ORAL_TABLET | Freq: Once | ORAL | Status: AC
  Administered 2019-06-17: 650 mg via ORAL

## 2019-06-17 MED ORDER — SODIUM CHLORIDE 0.9% FLUSH
10.0000 mL | Freq: Once | INTRAVENOUS | Status: AC
  Administered 2019-06-17: 10 mL
  Filled 2019-06-17: qty 10

## 2019-06-17 MED ORDER — DEXTROSE 5 % IV SOLN
Freq: Once | INTRAVENOUS | Status: AC
  Administered 2019-06-17: 10:00:00 via INTRAVENOUS
  Filled 2019-06-17: qty 250

## 2019-06-17 NOTE — Progress Notes (Signed)
[name] OFF[facility] PROGRESS NOTE  Adrienne Ada, MD  ASSESSMENT & PLAN:  Idiopathic thrombocytopenic purpura ([place]) She has positive response to treatment She have no side effects from treatment As previously discussed with her husband, we will continue monthly IVIG I will try to get her husband to come with her in her next visit  Bleeding hemorrhoid Her anemia is stable Observe closely  Poor memory She has significant memory impairment and is dependent on her husband on all decision making I will try to get her husband to come with her in her next visit for safety reasons   No orders of the defined types were placed in this encounter.   INTERVAL HISTORY: [name] 85 y.o. female returns for her monthly IVIG She denies infusion reaction She denies bleeding Her husband is not present She denies pain  SUMMARY OF HEMATOLOGIC HISTORY:  This is a pleasant lady with chronic thrombocytopenia. The patient was discovered to have low platelet count when she was placed on Plavix for coronary artery disease. 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 She is being observed. On 01/04/2016, she was started on prednisone therapy for ITP when her platelet count dip under  50,000 May 2017, she tolerated prednisone poorly and prednisone is slowly tapered off 05/05/2017, she had relapsed ITP and started on IVIG with complete response to Rx but with platelet count trending down after 4 weeks. She is then scheduled for IVIG every 3-4 weeks In 2020, her treatment was placed on hold due to COVID restriction, resume on May 20, 2019   I have reviewed the past medical history, past surgical history, social history and family history with the patient and they are unchanged from previous note.  ALLERGIES:   is allergic to bee venom; iodides; lyrica [pregabalin]; codeine; demerol [meperidine]; ambien [zolpidem tartrate]; aspirin; celebrex [celecoxib]; darvocet [propoxyphene n-acetaminophen]; fosamax [alendronate sodium]; lipitor [atorvastatin]; morphine and related; valium [diazepam]; and iodine.  MEDICATIONS:  Current Outpatient Medications  Medication Sig Dispense Refill  . amLODipine (NORVASC) 5 MG tablet Take 1 tablet (5 mg total) by mouth daily. 90 tablet 3  . Bacillus Coagulans-Inulin (ALIGN PREBIOTIC-PROBIOTIC) 5-1.25 MG-GM CHEW Chew 1 tablet by mouth 2 (two) times daily.    . calcium citrate (CALCITRATE - DOSED IN MG ELEMENTAL CALCIUM) 950 MG tablet Take 1 tablet by mouth daily.    . cetirizine (ZYRTEC) 10 MG tablet Take 10 mg by mouth daily.    . Cholecalciferol (VITAMIN D-3 PO) Take 1 tablet by mouth daily.    Marland Kitchen EPINEPHrine (ADRENALIN) 1 MG/ML injection Inject 1 mg as directed as needed for anaphylaxis. Reported on 02/01/2016    . ezetimibe (ZETIA) 10 MG tablet Take 1 tablet (10 mg total) by mouth daily. 30 tablet 6  . lidocaine-prilocaine (EMLA) cream Apply 1 application topically as needed. 30 g 6  . Lysine 500 MG CAPS Take 1 capsule by mouth 2 (two) times daily as needed (fever blisters).     . meclizine (ANTIVERT) 25 MG tablet Take 25 mg by mouth daily as needed for dizziness.     . Multiple Vitamins-Minerals (CENTRUM SILVER 50+WOMEN PO) Take 1 tablet by mouth daily.    . nitroGLYCERIN (NITROSTAT) 0.4 MG SL tablet Place 0.4 mg under the tongue every 5 (five) minutes as needed for chest pain. Reported on 02/01/2016    .  Omega 3 1000 MG CAPS Take 1 each by mouth daily.     Marland Kitchen omeprazole (PRILOSEC) 40 MG capsule Take 40 mg by mouth daily.    . Prenatal Vit-Fe Fumarate-FA (PRENATAL VITAMIN PO) Take 1 tablet by mouth 2 (two) times daily.    . psyllium (METAMUCIL) 58.6 % powder Take 1 packet by mouth daily.    . rosuvastatin (CRESTOR) 10 MG tablet Take 10 mg by mouth daily.  3  . sotalol  (BETAPACE) 160 MG tablet TAKE 1 TABLET (160 MG TOTAL) BY MOUTH 2 (TWO) TIMES DAILY. 60 tablet 10  . triamcinolone (NASACORT) 55 MCG/ACT AERO nasal inhaler Place 2 sprays into the nose 2 (two) times daily. 1 Inhaler 12   No current facility-administered medications for this visit.    Facility-Administered Medications Ordered in Other Visits  Medication Dose Route Frequency Provider Last Rate Last Dose  . sodium chloride flush (NS) 0.9 % injection 10 mL  10 mL Intravenous PRN Alvy Bimler, Tawania Daponte, MD   10 mL at 09/22/17 1322     REVIEW OF SYSTEMS:   Constitutional: Denies fevers, chills or night sweats Eyes: Denies blurriness of vision Ears, nose, mouth, throat, and face: Denies mucositis or sore throat Respiratory: Denies cough, dyspnea or wheezes Cardiovascular: Denies palpitation, chest discomfort or lower extremity swelling Gastrointestinal:  Denies nausea, heartburn or change in bowel habits Skin: Denies abnormal skin rashes Lymphatics: Denies new lymphadenopathy or easy bruising Neurological:Denies numbness, tingling or new weaknesses Behavioral/Psych: Mood is stable, no new changes  All other systems were reviewed with the patient and are negative.  PHYSICAL EXAMINATION: ECOG PERFORMANCE STATUS: 0 - Asymptomatic  Vitals:   06/17/19 0956  BP: 139/60  Pulse: 64  Resp: 18  Temp: 98.5 F (36.9 C)  SpO2: 100%   Filed Weights   06/17/19 0956  Weight: 143 lb 12.8 oz (65.2 kg)    GENERAL:alert, no distress and comfortable SKIN: skin color, texture, turgor are normal, no rashes or significant lesions EYES: normal, Conjunctiva are pink and non-injected, sclera clear OROPHARYNX:no exudate, no erythema and lips, buccal mucosa, and tongue normal  NECK: supple, thyroid normal size, non-tender, without nodularity LYMPH:  no palpable lymphadenopathy in the cervical, axillary or inguinal LUNGS: clear to auscultation and percussion with normal breathing effort HEART: regular rate & rhythm  and no murmurs and no lower extremity edema ABDOMEN:abdomen soft, non-tender and normal bowel sounds Musculoskeletal:no cyanosis of digits and no clubbing  NEURO: alert & oriented x 3 with fluent speech, no focal motor/sensory deficits  LABORATORY DATA:  I have reviewed the data as listed     Component Value Date/Time   NA 141 05/20/2019 0945   NA 142 05/05/2017 0850   K 4.0 05/20/2019 0945   K 4.0 05/05/2017 0850   CL 107 05/20/2019 0945   CL 104 08/02/2012 1515   CO2 25 05/20/2019 0945   CO2 25 05/05/2017 0850   GLUCOSE 181 (H) 05/20/2019 0945   GLUCOSE 130 05/05/2017 0850   GLUCOSE 105 (H) 08/02/2012 1515   BUN 21 05/20/2019 0945   BUN 18.9 05/05/2017 0850   CREATININE 0.95 05/20/2019 0945   CREATININE 0.9 05/05/2017 0850   CALCIUM 9.1 05/20/2019 0945   CALCIUM 10.0 05/05/2017 0850   PROT 6.7 05/20/2019 0945   PROT 6.9 05/05/2017 0850   ALBUMIN 3.7 05/20/2019 0945   ALBUMIN 3.8 05/05/2017 0850   AST 16 05/20/2019 0945   AST 19 05/05/2017 0850   ALT 12 05/20/2019 0945   ALT 14  05/05/2017 0850   ALKPHOS 52 05/20/2019 0945   ALKPHOS 54 05/05/2017 0850   BILITOT 0.3 05/20/2019 0945   BILITOT 0.48 05/05/2017 0850   GFRNONAA 55 (L) 05/20/2019 0945   GFRAA >60 05/20/2019 0945    No results found for: SPEP, UPEP  Lab Results  Component Value Date   WBC 6.4 06/17/2019   NEUTROABS 3.9 06/17/2019   HGB 9.8 (L) 06/17/2019   HCT 31.1 (L) 06/17/2019   MCV 90.1 06/17/2019   PLT 98 (L) 06/17/2019      Chemistry      Component Value Date/Time   NA 141 05/20/2019 0945   NA 142 05/05/2017 0850   K 4.0 05/20/2019 0945   K 4.0 05/05/2017 0850   CL 107 05/20/2019 0945   CL 104 08/02/2012 1515   CO2 25 05/20/2019 0945   CO2 25 05/05/2017 0850   BUN 21 05/20/2019 0945   BUN 18.9 05/05/2017 0850   CREATININE 0.95 05/20/2019 0945   CREATININE 0.9 05/05/2017 0850      Component Value Date/Time   CALCIUM 9.1 05/20/2019 0945   CALCIUM 10.0 05/05/2017 0850   ALKPHOS 52  05/20/2019 0945   ALKPHOS 54 05/05/2017 0850   AST 16 05/20/2019 0945   AST 19 05/05/2017 0850   ALT 12 05/20/2019 0945   ALT 14 05/05/2017 0850   BILITOT 0.3 05/20/2019 0945   BILITOT 0.48 05/05/2017 0850       I spent 15 minutes counseling the patient face to face. The total time spent in the appointment was 20 minutes and more than 50% was on counseling.   All questions were answered. The patient knows to call the clinic with any problems, questions or concerns. No barriers to learning was detected.    Heath Lark, MD 9/28/202010:15 AM

## 2019-06-17 NOTE — Progress Notes (Signed)
Pt and pt husband refused to stay 30 minutes post IVIG infusion.  Pt VSS and discharged home with spouse.

## 2019-06-17 NOTE — Assessment & Plan Note (Signed)
Her anemia is stable Observe closely 

## 2019-06-17 NOTE — Patient Instructions (Signed)
Immune Globulin Injection What is this medicine? IMMUNE GLOBULIN (im MUNE GLOB yoo lin) helps to prevent or reduce the severity of certain infections in patients who are at risk. This medicine is collected from the pooled blood of many donors. It is used to treat immune system problems, thrombocytopenia, and Kawasaki syndrome. This medicine may be used for other purposes; ask your health care provider or pharmacist if you have questions. COMMON BRAND NAME(S): ASCENIV, Baygam, BIVIGAM, Carimune, Carimune NF, cutaquig, Cuvitru, Flebogamma, Flebogamma DIF, GamaSTAN, GamaSTAN S/D, Gamimune N, Gammagard, Gammagard S/D, Gammaked, Gammaplex, Gammar-P IV, Gamunex, Gamunex-C, Hizentra, Iveegam, Iveegam EN, Octagam, Panglobulin, Panglobulin NF, panzyga, Polygam S/D, Privigen, Sandoglobulin, Venoglobulin-S, Vigam, Vivaglobulin, Xembify What should I tell my health care provider before I take this medicine? They need to know if you have any of these conditions:   diabetes   extremely low or no immune antibodies in the blood   heart disease   history of blood clots   hyperprolinemia   infection in the blood, sepsis   kidney disease   taking medicine that may change kidney function - ask your health care provider about your medicine   an unusual or allergic reaction to human immune globulin, albumin, maltose, sucrose, polysorbate 80, other medicines, foods, dyes, or preservatives   pregnant or trying to get pregnant   breast-feeding How should I use this medicine? This medicine is for injection into a muscle or infusion into a vein or skin. It is usually given by a health care professional in a hospital or clinic setting. In rare cases, some brands of this medicine might be given at home. You will be taught how to give this medicine. Use exactly as directed. Take your medicine at regular intervals. Do not take your medicine more often than directed. Talk to your pediatrician regarding  the use of this medicine in children. Special care may be needed. Overdosage: If you think you have taken too much of this medicine contact a poison control center or emergency room at once. NOTE: This medicine is only for you. Do not share this medicine with others. What if I miss a dose? It is important not to miss your dose. Call your doctor or health care professional if you are unable to keep an appointment. If you give yourself the medicine and you miss a dose, take it as soon as you can. If it is almost time for your next dose, take only that dose. Do not take double or extra doses. What may interact with this medicine?  aspirin and aspirin-like medicines  cisplatin  cyclosporine  medicines for infection like acyclovir, adefovir, amphotericin B, bacitracin, cidofovir, foscarnet, ganciclovir, gentamicin, pentamidine, vancomycin  NSAIDS, medicines for pain and inflammation, like ibuprofen or naproxen  pamidronate  vaccines  zoledronic acid This list may not describe all possible interactions. Give your health care provider a list of all the medicines, herbs, non-prescription drugs, or dietary supplements you use. Also tell them if you smoke, drink alcohol, or use illegal drugs. Some items may interact with your medicine. What should I watch for while using this medicine? Your condition will be monitored carefully while you are receiving this medicine. This medicine is made from pooled blood donations of many different people. It may be possible to pass an infection in this medicine. However, the donors are screened for infections and all products are tested for HIV and hepatitis. The medicine is treated to kill most or all bacteria and viruses. Talk to your doctor about   the risks and benefits of this medicine. Do not have vaccinations for at least 14 days before, or until at least 3 months after receiving this medicine. What side effects may I notice from receiving this  medicine? Side effects that you should report to your doctor or health care professional as soon as possible:  allergic reactions like skin rash, itching or hives, swelling of the face, lips, or tongue  breathing problems  chest pain or tightness  fever, chills  headache with nausea, vomiting  neck pain or difficulty moving neck  pain when moving eyes  pain, swelling, warmth in the leg  problems with balance, talking, walking  sudden weight gain  swelling of the ankles, feet, hands  trouble passing urine or change in the amount of urine Side effects that usually do not require medical attention (report to your doctor or health care professional if they continue or are bothersome):  dizzy, drowsy  flushing  increased sweating  leg cramps  muscle aches and pains  pain at site where injected This list may not describe all possible side effects. Call your doctor for medical advice about side effects. You may report side effects to FDA at 1-800-FDA-1088. Where should I keep my medicine? Keep out of the reach of children. This drug is usually given in a hospital or clinic and will not be stored at home. In rare cases, some brands of this medicine may be given at home. If you are using this medicine at home, you will be instructed on how to store this medicine. Throw away any unused medicine after the expiration date on the label. NOTE: This sheet is a summary. It may not cover all possible information. If you have questions about this medicine, talk to your doctor, pharmacist, or health care provider.  2020 Elsevier/Gold Standard (2008-11-26 11:44:49)  

## 2019-06-17 NOTE — Assessment & Plan Note (Signed)
She has significant memory impairment and is dependent on her husband on all decision making I will try to get her husband to come with her in her next visit for safety reasons

## 2019-06-17 NOTE — Assessment & Plan Note (Signed)
She has positive response to treatment She have no side effects from treatment As previously discussed with her husband, we will continue monthly IVIG I will try to get her husband to come with her in her next visit

## 2019-06-19 ENCOUNTER — Telehealth: Payer: Self-pay | Admitting: Hematology and Oncology

## 2019-06-19 NOTE — Telephone Encounter (Signed)
I spoke with her husband over the telephone The patient is doing well Due to her dementia and recent behavioral difficulties, we have gotten her husband to visit her past to be with her for her future treatment I reviewed test results with him I have addressed all his questions and concerns

## 2019-07-12 ENCOUNTER — Emergency Department (HOSPITAL_COMMUNITY)
Admission: EM | Admit: 2019-07-12 | Discharge: 2019-07-15 | Disposition: A | Discharge status: Other Acute Inpatient Hospital | Payer: Medicare HMO | Attending: Emergency Medicine | Admitting: Emergency Medicine

## 2019-07-12 ENCOUNTER — Other Ambulatory Visit: Payer: Self-pay

## 2019-07-12 ENCOUNTER — Encounter (HOSPITAL_COMMUNITY): Payer: Self-pay

## 2019-07-12 DIAGNOSIS — I4589 Other specified conduction disorders: Secondary | ICD-10-CM | POA: Insufficient documentation

## 2019-07-12 DIAGNOSIS — R001 Bradycardia, unspecified: Secondary | ICD-10-CM | POA: Diagnosis not present

## 2019-07-12 DIAGNOSIS — F0151 Vascular dementia with behavioral disturbance: Secondary | ICD-10-CM | POA: Diagnosis not present

## 2019-07-12 DIAGNOSIS — I2510 Atherosclerotic heart disease of native coronary artery without angina pectoris: Secondary | ICD-10-CM | POA: Insufficient documentation

## 2019-07-12 DIAGNOSIS — R45851 Suicidal ideations: Secondary | ICD-10-CM | POA: Diagnosis not present

## 2019-07-12 DIAGNOSIS — I517 Cardiomegaly: Secondary | ICD-10-CM | POA: Diagnosis not present

## 2019-07-12 DIAGNOSIS — R69 Illness, unspecified: Secondary | ICD-10-CM | POA: Diagnosis not present

## 2019-07-12 DIAGNOSIS — R410 Disorientation, unspecified: Secondary | ICD-10-CM | POA: Diagnosis not present

## 2019-07-12 DIAGNOSIS — Z79899 Other long term (current) drug therapy: Secondary | ICD-10-CM | POA: Diagnosis not present

## 2019-07-12 DIAGNOSIS — Z20828 Contact with and (suspected) exposure to other viral communicable diseases: Secondary | ICD-10-CM | POA: Diagnosis not present

## 2019-07-12 DIAGNOSIS — Z03818 Encounter for observation for suspected exposure to other biological agents ruled out: Secondary | ICD-10-CM | POA: Diagnosis not present

## 2019-07-12 DIAGNOSIS — F0391 Unspecified dementia with behavioral disturbance: Secondary | ICD-10-CM

## 2019-07-12 DIAGNOSIS — F0390 Unspecified dementia without behavioral disturbance: Secondary | ICD-10-CM | POA: Diagnosis present

## 2019-07-12 LAB — CBC WITH DIFFERENTIAL/PLATELET
Abs Immature Granulocytes: 0.03 10*3/uL (ref 0.00–0.07)
Basophils Absolute: 0 10*3/uL (ref 0.0–0.1)
Basophils Relative: 0 %
Eosinophils Absolute: 0.1 10*3/uL (ref 0.0–0.5)
Eosinophils Relative: 1 %
HCT: 31.5 % — ABNORMAL LOW (ref 36.0–46.0)
Hemoglobin: 10 g/dL — ABNORMAL LOW (ref 12.0–15.0)
Immature Granulocytes: 0 %
Lymphocytes Relative: 27 %
Lymphs Abs: 2.6 10*3/uL (ref 0.7–4.0)
MCH: 28.5 pg (ref 26.0–34.0)
MCHC: 31.7 g/dL (ref 30.0–36.0)
MCV: 89.7 fL (ref 80.0–100.0)
Monocytes Absolute: 1.1 10*3/uL — ABNORMAL HIGH (ref 0.1–1.0)
Monocytes Relative: 11 %
Neutro Abs: 5.9 10*3/uL (ref 1.7–7.7)
Neutrophils Relative %: 61 %
Platelets: 79 10*3/uL — ABNORMAL LOW (ref 150–400)
RBC: 3.51 MIL/uL — ABNORMAL LOW (ref 3.87–5.11)
RDW: 14 % (ref 11.5–15.5)
WBC: 9.7 10*3/uL (ref 4.0–10.5)
nRBC: 0 % (ref 0.0–0.2)

## 2019-07-12 LAB — RAPID URINE DRUG SCREEN, HOSP PERFORMED
Amphetamines: NOT DETECTED
Barbiturates: NOT DETECTED
Benzodiazepines: NOT DETECTED
Cocaine: NOT DETECTED
Opiates: NOT DETECTED
Tetrahydrocannabinol: NOT DETECTED

## 2019-07-12 LAB — COMPREHENSIVE METABOLIC PANEL
ALT: 15 U/L (ref 0–44)
AST: 25 U/L (ref 15–41)
Albumin: 3.6 g/dL (ref 3.5–5.0)
Alkaline Phosphatase: 49 U/L (ref 38–126)
Anion gap: 7 (ref 5–15)
BUN: 25 mg/dL — ABNORMAL HIGH (ref 8–23)
CO2: 23 mmol/L (ref 22–32)
Calcium: 9.3 mg/dL (ref 8.9–10.3)
Chloride: 106 mmol/L (ref 98–111)
Creatinine, Ser: 1.05 mg/dL — ABNORMAL HIGH (ref 0.44–1.00)
GFR calc Af Amer: 56 mL/min — ABNORMAL LOW (ref >60)
GFR calc non Af Amer: 49 mL/min — ABNORMAL LOW (ref >60)
Glucose, Bld: 138 mg/dL — ABNORMAL HIGH (ref 70–99)
Potassium: 4 mmol/L (ref 3.5–5.1)
Sodium: 136 mmol/L (ref 135–145)
Total Bilirubin: 0.4 mg/dL (ref 0.3–1.2)
Total Protein: 6.9 g/dL (ref 6.5–8.1)

## 2019-07-12 LAB — ETHANOL: Alcohol, Ethyl (B): <10 mg/dL (ref <10)

## 2019-07-12 LAB — URINALYSIS, ROUTINE W REFLEX MICROSCOPIC
Bilirubin Urine: NEGATIVE
Glucose, UA: NEGATIVE mg/dL
Ketones, ur: NEGATIVE mg/dL
Nitrite: NEGATIVE
Protein, ur: NEGATIVE mg/dL
Specific Gravity, Urine: 1.013 (ref 1.005–1.030)
pH: 6 (ref 5.0–8.0)

## 2019-07-12 LAB — SARS CORONAVIRUS 2 BY RT PCR (HOSPITAL ORDER, PERFORMED IN ~~LOC~~ HOSPITAL LAB): SARS Coronavirus 2: NEGATIVE

## 2019-07-12 LAB — ACETAMINOPHEN LEVEL: Acetaminophen (Tylenol), Serum: <10 ug/mL — ABNORMAL LOW (ref 10–30)

## 2019-07-12 LAB — SALICYLATE LEVEL: Salicylate Lvl: <7 mg/dL (ref 2.8–30.0)

## 2019-07-12 MED ORDER — LORAZEPAM 0.5 MG PO TABS
0.5000 mg | ORAL_TABLET | Freq: Four times a day (QID) | ORAL | Status: DC | PRN
  Administered 2019-07-12 – 2019-07-13 (×4): 0.5 mg via ORAL
  Filled 2019-07-12 (×4): qty 1

## 2019-07-12 NOTE — BH Assessment (Signed)
[facility] Assessment Progress Note   TTS spoke to patient's husband, [name] [phone], and obtained collateral information.  He states that patient had heart surgery in 2010 and had a brain bleed.  He states that it initially left her almost blind, but she eventually regained her sight.  However, he states that her memory was affected by the bleed. He states that they were referred to a neurologist, but no recommendations were made.  He states that patient is constantly paranoid and thinks that people and her husband are out to cause harm to her or to kill her.  Patient has made statements that she has no desire to live.  He states that she talks about walking into traffic often and he states that in July this year that she left the house angry and was found sitting on the side of a street.  He states that she was taken to [facility] ED and she was prescribed Seroquel, but she would sleep 24 hours a day on this medication.  He states that the PCP changed the medication to Risperdal, but he states that this medication has not helped and her condition is worsening.  He states that patient most likely has dementia and he states that she had siblings who had Alzheimers.  He states that patient siblings have all died.  He states that patient was a triplet and after her other two siblings her age died that patient's condition worsened.  Husband states that he has been trying to care for her on his own, but he is not sure if he can continue to do this as her condition worsens.  He states that he is agreeable to geriatric behavioral health placement, but it things do not improve with the hospitalization that he might need to seek SNF placement.  Husband states that he has medical power of attorney.  Based on husband's report, patient is in need of geriatric psych placement.

## 2019-07-12 NOTE — ED Triage Notes (Signed)
EMS reports pt has hxt of dementia, found pt walking down the road and attempting to get hit by cars. Pt reports she hasn't felt herself, Explicitly saying "she feels like another person and doesn't like her." vitals are WDL.

## 2019-07-12 NOTE — Progress Notes (Addendum)
1:50pm: CSW had made several attempts to reach this patient's husband by phone over the last two hours without success. CSW spoke with RN who also had not received a return call from husband. CSW placed additional call to husband [name] and he answered, stating he had to purchase new telephones this morning as his wife had thrown them all away. [name] states he is able to care for his wife independently without difficulty. [name] denies the need for home health services. [name] is agreeable to speak with TTS staff.  CSW notified Danny Sprinkle, LCAS of Edgar's willingness to speak with him.  11:30am: CSW received return call from [facility] stating that this patient's husband was not home when he completed the welfare check. Deputy reports speaking with the patient's daughter Beverlee Nims to determine if she had spoken to her father and Beverlee Nims had not. Beverlee Nims was informed that her mother is at [facility] ED by the deputy. Deputy reports this is not the first time a similar event has occurred.  CSW spoke with patient's daughter Beverlee Nims at [phone] who states her father does not have a cell phone and the phone number that staff have been trying to reach him at is a house phone. Beverlee Nims reports her parents have difficulty using the phone sometimes. Beverlee Nims will continue attempting to reach her father to inform him of her mother's whereabouts.  10:30am: CSW received call from Luis Lopez, [place] regarding concerns for this patient. CSW attempted to contact the patient's husband [name] at [phone] without success, a voicemail was left requesting a return call.  CSW spoke with Lovena Le of [facility] who will dispatch a deputy to the home address - [address] in [place] for a safety check on the patient's husband.  Madilyn Fireman, MSW, LCSW-A Transitions of Care  Clinical Social Worker  [facility] Emergency Departments  Medical ICU [phone]

## 2019-07-12 NOTE — BH Assessment (Signed)
[place] Assessment Progress Note   TTS attempted to contact patient's husband, [name] at [phone], for collateral information, but there was no answer. HIPPA compliant voicemail left requesting a return call.

## 2019-07-12 NOTE — ED Notes (Signed)
TTS in process 

## 2019-07-12 NOTE — ED Notes (Signed)
Diet was ordered for Lunch. 

## 2019-07-12 NOTE — BH Assessment (Addendum)
Tele Assessment Note   Patient Name: [name] MRN: [id number] Referring Physician: Dr. Ripley Fraise. Location of Patient: [facility], [phone]. Location of Provider: [place] is an 85 y.o. female, who presents voluntary and unaccompanied to [facility]. Clinician asked the pt, "what brought you to the hospital?" Pt reported, "my husband is coming and he'll tell you, I don't want to tell you." Clinician asked the pt, "were you walking down the road trying to get hit by cars?" Pt reported, "yes, I guess I was." Pt reported, she was walking in the road to kill herself but she feels better now. Clinician asked the pt of current stressors, pt replied, "I'll keep that a secret." Pt denies, SI, HI, AVH, self-injurious behaviors and access to weapons.   Clinician was unable to assess the following: being linked to OPT resources, medication compliance, history of abuse, vegetative symptoms, DSS involvement, history of violence, orientation, stressors, contract for safety, would sign-in voluntarily. Pt denies substance use. Pt's UDS is pending.   Pt presents alert in scrubs with logical, coherent speech. Pt is hard of hearing, she was frustrated at times because she couldn't hear clinician. Pt's eye contact was fair. Pt's mood was pleasant. Pt's thought process was circumstantial. Pt's judgement was impaired. Pt's concentration was fair. Pt's insight impulse control was poor.   *Pt consented for clinician to contact her husband to gather collateral information, pt then asked what time it was. Clinician gave the pt the current time. Pt then reported, it was too late for her husband to be contacted. Pt asked her husband be contacted after 0800.*   Diagnosis: Major Vascular Neurocognitive Disorder.  Past Medical History:  Past Medical History:  Diagnosis Date  . Bilateral hearing loss   . CAD (coronary artery disease) 2003   on Plavix.   . CVA (cerebral  vascular accident) ([place]) 2010  . Fatigue   . Gross hematuria 07/03/2015  . Hemorrhagic stroke ([place]) 2010  . Hiatal hernia   . Hypercholesteremia   . ITP (idiopathic thrombocytopenic purpura) 01/04/2016  . Kidney stones   . Osteoporosis   . Pancreatitis 1970  . Pancreatitis   . Thrombocytopenia ([place])   . Unspecified deficiency anemia     Past Surgical History:  Procedure Laterality Date  . APPENDECTOMY    . CARDIAC VALVE SURGERY  2010  . CATARACT EXTRACTION  2017   x 2  . COLONOSCOPY     [facility], Dr. Wynetta Emery  . COLONOSCOPY WITH PROPOFOL N/A 04/09/2013   Procedure: COLONOSCOPY WITH PROPOFOL;  Surgeon: Garlan Fair, MD;  Location: [facility];  Service: Endoscopy;  Laterality: N/A;  . CORONARY ARTERY BYPASS GRAFT  06/2009  . FLEXIBLE SIGMOIDOSCOPY N/A 08/03/2015   Procedure: FLEXIBLE SIGMOIDOSCOPY;  Surgeon: Garlan Fair, MD;  Location: [facility];  Service: Endoscopy;  Laterality: N/A;  . IR FLUORO GUIDE PORT INSERTION RIGHT  07/11/2017  . IR US GUIDE VASC ACCESS RIGHT  07/11/2017  . TUBAL LIGATION      Family History:  Family History  Problem Relation Age of Onset  . Cancer Mother 23       colon  . Heart attack Father   . Cancer Sister        colon, cervical  . Stroke Sister   . Memory loss Sister   . Cancer Brother        lung  . Dementia Brother     Social History:  reports that  she has never smoked. She has never used smokeless tobacco. She reports that she does not drink alcohol or use drugs.  Additional Social History:  Alcohol / Drug Use Pain Medications: See MAR Prescriptions: See MAR Over the Counter: See MAR History of alcohol / drug use?: (Pt denies however UDS is pending.)  CIWA: CIWA-Ar BP: (!) 125/55 Pulse Rate: (!) 55 COWS:    Allergies:  Allergies  Allergen Reactions  . Bee Venom Anaphylaxis  . Iodides Anaphylaxis  . Lyrica [Pregabalin]   . Codeine Other (See Comments)    Reaction: hallucinate  . Demerol [Meperidine] Hives  and Other (See Comments)    REACTION: Welts  . Ambien [Zolpidem Tartrate] Other (See Comments)    Altered Mental Status  . Aspirin Other (See Comments)    Bleeding, aneurysm- when she was on plavix Bleeding, aneurysm- when she was on plavix  . Celebrex [Celecoxib] Other (See Comments)    Legs hurt  . Darvocet [Propoxyphene N-Acetaminophen] Other (See Comments)    Hallucinates  . Fosamax [Alendronate Sodium] Other (See Comments)    Legs hurt  . Lipitor [Atorvastatin] Other (See Comments)    Legs hurt  . Morphine And Related Other (See Comments)    Hallucinations  . Valium [Diazepam] Other (See Comments)    Altered Mental Status  . Iodine Hives    Welts    Home Medications: (Not in a hospital admission)   OB/GYN Status:  No LMP recorded. Patient is postmenopausal.  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  Living Arrangements: Spouse/significant other Can pt return to current living arrangement?: (UTA) Admission Status: Voluntary Is patient capable of signing voluntary admission?: Yes Referral Source: Self/Family/Friend Insurance type: [employer].     Crisis Care Plan Living Arrangements: Spouse/significant other Legal Guardian: (UTA) Name of Psychiatrist: UTA Name of Therapist: UTA  Education Status Is patient currently in school?: No Is the patient employed, unemployed or receiving disability?: (UTA)  Risk to self with the past 6 months Suicidal Ideation: Yes-Currently Present Has patient been a risk to self within the past 6 months prior to admission? : Yes Suicidal Intent:  Yes-Currently Present Has patient had any suicidal intent within the past 6 months prior to admission? : Yes Is patient at risk for suicide?: Yes Suicidal Plan?: Yes-Currently Present Has patient had any suicidal plan within the past 6 months prior to admission? : No Specify Current Suicidal Plan: Pt reported, walking down the road trying to get hit by a car.  Access to Means: Yes Specify Access to Suicidal Means: Pt has access to the road.  What has been your use of drugs/alcohol within the last 12 months?: Pt's UDS is pending. Previous Attempts/Gestures: No(Pt denies. ) How many times?: 0 Other Self Harm Risks: NA Triggers for Past Attempts: None known Intentional Self Injurious Behavior: None(Pt denies. ) Family Suicide History: Unable to assess Recent stressful life event(s): Other (Comment)(Pt reported, "I'll keep that a secret." ) Persecutory voices/beliefs?: No Depression: Yes Depression Symptoms: Feeling angry/irritable(Sadness.) Substance abuse history and/or treatment for substance abuse?: No Suicide prevention information given to non-admitted patients: Not applicable  Risk to Others within the past 6 months Homicidal Ideation: No(Pt denies. )  Does patient have any lifetime risk of violence toward others beyond the six months prior to admission? : (UTA) Thoughts of Harm to Others: No(Pt denies. ) Current Homicidal Intent: No Current Homicidal Plan: No Access to Homicidal Means: No Identified Victim: NA History of harm to others?: (UTA) Assessment of Violence: (UTA) Violent Behavior Description: UTA Does patient have access to weapons?: No(Pt denies. ) Criminal Charges Pending?: No Does patient have a court date: No Is patient on probation?: No  Psychosis Hallucinations: None noted(Pt denies. ) Delusions: None noted(Pt denies. )  Mental Status Report Appearance/Hygiene: In scrubs Eye Contact: Fair Motor Activity: Unremarkable Speech: Logical/coherent Level of  Consciousness: Alert Mood: Pleasant Affect: Flat Anxiety Level: Minimal Thought Processes: Circumstantial Judgement: Impaired Orientation: Unable to assess Obsessive Compulsive Thoughts/Behaviors: None  Cognitive Functioning Concentration: Fair Memory: Recent Impaired Is patient IDD: No Insight: Poor Impulse Control: Poor Appetite: Good Have you had any weight changes? : No Change Sleep: Decreased Total Hours of Sleep: (Pt reported, "not much last night." ) Vegetative Symptoms: Unable to Assess  ADLScreening [facility] Assessment Services) Patient's cognitive ability adequate to safely complete daily activities?: Yes Patient able to express need for assistance with ADLs?: Yes Independently performs ADLs?: Yes (appropriate for developmental age)(Pt reports.)  Prior Inpatient Therapy Prior Inpatient Therapy: No  Prior Outpatient Therapy Prior Outpatient Therapy: No Does patient have an ACCT team?: No Does patient have Intensive In-House Services?  : No Does patient have [facility] services? : No Does patient have P4CC services?: No  ADL Screening (condition at time of admission) Patient's cognitive ability adequate to safely complete daily activities?: Yes Is the patient deaf or have difficulty hearing?: Yes(Pt is hard of hearing.) Does the patient have difficulty seeing, even when wearing glasses/contacts?: Yes(Pt wears glasses.) Does the patient have difficulty concentrating, remembering, or making decisions?: Yes Patient able to express need for assistance with ADLs?: Yes Does the patient have difficulty dressing or bathing?: No Independently performs ADLs?: Yes (appropriate for developmental age)(Pt reports.) Does the patient have difficulty walking or climbing stairs?: No(Pt denies.) Weakness of Legs: None(Pt denies.) Weakness of Arms/Hands: None(Pt denies.)  Home Assistive Devices/Equipment Home Assistive Devices/Equipment: None    Abuse/Neglect Assessment (Assessment  to be complete while patient is alone) Abuse/Neglect Assessment Can Be Completed: Unable to assess, patient is non-responsive or altered mental status     Advance Directives (For Healthcare) Does Patient Have a Medical Advance Directive?: Unable to assess, patient is non-responsive or altered mental status          Disposition: Adaku, Amike, NP recommends pt to be observed and reassessed by psychiatry. Disposition discussed with Dr. Christy Gentles and Beckie Busing, RN.    Disposition Initial Assessment Completed for this Encounter: Yes  This service was provided via telemedicine using a 2-way, interactive audio and video technology.  Names of all persons participating in this telemedicine service and their role in this encounter. Name: [name]. Role: Patient.   Name:Kayah Hecker D Chelcea Zahn, MS, [facility], CRC. Role: Counselor.           Vertell Novak 07/12/2019 5:18 AM    Vertell Novak, MS, [facility], [place] Triage Specialist [phone]

## 2019-07-12 NOTE — ED Provider Notes (Signed)
[place] EMERGENCY DEPARTMENT Provider Note   CSN: [id number] Arrival date & time: 07/12/19  0127     History   Chief Complaint Chief Complaint  Patient presents with  . Suicidal   Level 5 caveat due to dementia HPI [name] is a 85 y.o. female.     The history is provided by the patient.   Patient with history of CAD, previous stroke, dementia presents after being found walking down the road attempting to get hit by cars.  She reported she did not feel like herself.  She feels like she does feel like another person does not like that person.  When I evaluate the patient, she is very concerned about the pants she is wearing.  She reports they are not her pants and she would like to have them removed. Past Medical History:  Diagnosis Date  . Bilateral hearing loss   . CAD (coronary artery disease) 2003   on Plavix.   . CVA (cerebral vascular accident) ([place]) 2010  . Fatigue   . Gross hematuria 07/03/2015  . Hemorrhagic stroke ([place]) 2010  . Hiatal hernia   . Hypercholesteremia   . ITP (idiopathic thrombocytopenic purpura) 01/04/2016  . Kidney stones   . Osteoporosis   . Pancreatitis 1970  . Pancreatitis   . Thrombocytopenia ([place])   . Unspecified deficiency anemia     Patient Active Problem List   Diagnosis Date Noted  . Deficiency anemia 05/20/2019  . Acute anemia 05/07/2019  . Recurrent vertigo 11/02/2018  . Seasonal allergies 07/12/2018  . Hives of unknown origin 03/13/2018  . Financial difficulties 01/16/2018  . Poor memory 11/17/2017  . Bleeding hemorrhoid 09/22/2017  . Port-A-Cath in place 08/25/2017  . Poor venous access 05/05/2017  . Chronic ITP (idiopathic thrombocytopenia) (HCC) 05/02/2017  . Hyperlipidemia LDL goal <70 09/29/2016  . Idiopathic thrombocytopenic purpura ([place]) 01/04/2016  . S/P mitral valve repair 04/26/2015  . Hypertrophic obstructive cardiomyopathy ([place]) 04/08/2014  . Intracranial hemorrhage ([place])  04/08/2014  . Paroxysmal atrial fibrillation ([place]) 04/08/2014  . Thrombocytopenia ([place])   . Other fatigue   . Unspecified deficiency anemia     Past Surgical History:  Procedure Laterality Date  . APPENDECTOMY    . CARDIAC VALVE SURGERY  2010  . CATARACT EXTRACTION  2017   x 2  . COLONOSCOPY     [facility], Dr. Wynetta Emery  . COLONOSCOPY WITH PROPOFOL N/A 04/09/2013   Procedure: COLONOSCOPY WITH PROPOFOL;  Surgeon: Garlan Fair, MD;  [facility];  Service: Endoscopy;  Laterality: N/A;  . CORONARY ARTERY BYPASS GRAFT  06/2009  . FLEXIBLE SIGMOIDOSCOPY N/A 08/03/2015   Procedure: FLEXIBLE SIGMOIDOSCOPY;  Surgeon: Garlan Fair, MD;  [facility];  Service: Endoscopy;  Laterality: N/A;  . IR FLUORO GUIDE PORT INSERTION RIGHT  07/11/2017  . IR US GUIDE VASC ACCESS RIGHT  07/11/2017  . TUBAL LIGATION       OB History   No obstetric history on file.      Home Medications    Prior to Admission medications   Medication Sig Start Date End Date Taking? Authorizing Provider  amLODipine (NORVASC) 5 MG tablet Take 1 tablet (5 mg total) by mouth daily. 09/29/16 08/21/18  Belva Crome, MD  Bacillus Coagulans-Inulin (ALIGN PREBIOTIC-PROBIOTIC) 5-1.25 MG-GM CHEW Chew 1 tablet by mouth 2 (two) times daily. 08/09/18   Nicholas Lose, MD  calcium citrate (CALCITRATE - DOSED IN MG ELEMENTAL CALCIUM) 950 MG tablet Take 1 tablet  by mouth daily.    [provider]  cetirizine (ZYRTEC) 10 MG tablet Take 10 mg by mouth daily.    [provider]  Cholecalciferol (VITAMIN D-3 PO) Take 1 tablet by mouth daily.    [provider]  EPINEPHrine (ADRENALIN) 1 MG/ML injection Inject 1 mg as directed as needed for anaphylaxis. Reported on 02/01/2016    [provider]  ezetimibe (ZETIA) 10 MG tablet Take 1 tablet (10 mg total) by mouth daily. 06/21/13   Belva Crome, MD  lidocaine-prilocaine (EMLA) cream Apply 1 application topically as needed. 06/30/17    Heath Lark, MD  Lysine 500 MG CAPS Take 1 capsule by mouth 2 (two) times daily as needed (fever blisters).     [provider]  meclizine (ANTIVERT) 25 MG tablet Take 25 mg by mouth daily as needed for dizziness.     [provider]  Multiple Vitamins-Minerals (CENTRUM SILVER 50+WOMEN PO) Take 1 tablet by mouth daily.    [provider]  nitroGLYCERIN (NITROSTAT) 0.4 MG SL tablet Place 0.4 mg under the tongue every 5 (five) minutes as needed for chest pain. Reported on 02/01/2016    [provider]  Omega 3 1000 MG CAPS Take 1 each by mouth daily.     [provider]  omeprazole (PRILOSEC) 40 MG capsule Take 40 mg by mouth daily.    [provider]  Prenatal Vit-Fe Fumarate-FA (PRENATAL VITAMIN PO) Take 1 tablet by mouth 2 (two) times daily.    [provider]  psyllium (METAMUCIL) 58.6 % powder Take 1 packet by mouth daily.    [provider]  rosuvastatin (CRESTOR) 10 MG tablet Take 10 mg by mouth daily. 09/10/16   [provider]  sotalol (BETAPACE) 160 MG tablet TAKE 1 TABLET (160 MG TOTAL) BY MOUTH 2 (TWO) TIMES DAILY. 10/24/16   Belva Crome, MD  triamcinolone (NASACORT) 55 MCG/ACT AERO nasal inhaler Place 2 sprays into the nose 2 (two) times daily. 07/12/18   Heath Lark, MD    Family History Family History  Problem Relation Age of Onset  . Cancer Mother 71       colon  . Heart attack Father   . Cancer Sister        colon, cervical  . Stroke Sister   . Memory loss Sister   . Cancer Brother        lung  . Dementia Brother     Social History Social History   Tobacco Use  . Smoking status: Never Smoker  . Smokeless tobacco: Never Used  Substance Use Topics  . Alcohol use: No  . Drug use: No     Allergies   Bee venom, Iodides, Lyrica [pregabalin], Codeine, Demerol [meperidine], Ambien [zolpidem tartrate], Aspirin, Celebrex [celecoxib], Darvocet [propoxyphene n-acetaminophen], Fosamax  [alendronate sodium], Lipitor [atorvastatin], Morphine and related, Valium [diazepam], and Iodine   Review of Systems Review of Systems  Unable to perform ROS: Dementia     Physical Exam Updated Vital Signs BP (!) 125/55   Pulse (!) 55   Temp 97.7 F (36.5 C) (Oral)   Resp 16   Ht 1.575 m (5\' 2" )   Wt 59 kg   SpO2 100%   BMI 23.78 kg/m   Physical Exam CONSTITUTIONAL: Elderly, no acute distress HEAD: Normocephalic/atraumatic EYES: EOMI/PERRL ENMT: Mucous membranes moist NECK: supple no meningeal signs SPINE/BACK:entire spine nontender CV: S1/S2 noted, no murmurs/rubs/gallops noted LUNGS: Lungs are clear to auscultation bilaterally, no apparent distress  ABDOMEN: soft, nontender, no rebound or guarding, bowel sounds noted throughout abdomen GU:no cva tenderness NEURO: Pt is awake/alert, moves all extremitiesx4.  No facial droop.  Patient appears confused.  She is perseverating about her pants EXTREMITIES: pulses normal/equal, full ROM SKIN: warm, color normal PSYCH: Anxious, distractible  ED Treatments / Results  Labs (all labs ordered are listed, but only abnormal results are displayed) Labs Reviewed  CBC WITH DIFFERENTIAL/PLATELET - Abnormal; Notable for the following components:      Result Value   RBC 3.51 (*)    Hemoglobin 10.0 (*)    HCT 31.5 (*)    Platelets 79 (*)    Monocytes Absolute 1.1 (*)    All other components within normal limits  COMPREHENSIVE METABOLIC PANEL - Abnormal; Notable for the following components:   Glucose, Bld 138 (*)    BUN 25 (*)    Creatinine, Ser 1.05 (*)    GFR calc non Af Amer 49 (*)    GFR calc Af Amer 56 (*)    All other components within normal limits  ACETAMINOPHEN LEVEL - Abnormal; Notable for the following components:   Acetaminophen (Tylenol), Serum <10 (*)    All other components within normal limits  SARS CORONAVIRUS 2 BY RT PCR (HOSPITAL ORDER, [facility])  ETHANOL  SALICYLATE LEVEL   RAPID URINE DRUG SCREEN, HOSP PERFORMED  URINALYSIS, ROUTINE W REFLEX MICROSCOPIC    EKG EKG Interpretation  Date/Time:  Friday July 12 2019 03:05:07 EDT Ventricular Rate:  51 PR Interval:  184 QRS Duration: 114 QT Interval:  524 QTC Calculation: 482 R Axis:   -12 Text Interpretation:  Sinus bradycardia Minimal voltage criteria for LVH, may be normal variant ( Cornell product ) Anteroseptal infarct , age undetermined Abnormal ECG Confirmed by Ripley Fraise [phone]) on 07/12/2019 3:31:28 AM   Radiology No results found.  Procedures Procedures    Medications Ordered in ED Medications - No data to display   Initial Impression / Assessment and Plan / ED Course  I have reviewed the triage vital signs and the nursing notes.  Pertinent labs  results that were available during my care of the patient were reviewed by me and considered in my medical decision making (see chart for details).        2:17 AM Patient presents via EMS after being found walking down the street trying to get hit by cars She has a history of dementia.  I have attempted to call her husband left a message as no one answered. Labs and chest x-ray are pending at this time 4:27 AM No word from family.  Patient has been stable and resting comfortably.  Will consult psychiatry  patient is medically stable at this time Final Clinical Impressions(s) / ED Diagnoses   Final diagnoses:  Dementia with behavioral disturbance, unspecified dementia type [facility])    ED Discharge Orders    None       Ripley Fraise, MD 07/12/19 [phone]

## 2019-07-13 ENCOUNTER — Other Ambulatory Visit: Payer: Self-pay

## 2019-07-13 ENCOUNTER — Emergency Department (HOSPITAL_COMMUNITY): Payer: Medicare HMO

## 2019-07-13 DIAGNOSIS — I517 Cardiomegaly: Secondary | ICD-10-CM | POA: Diagnosis not present

## 2019-07-13 MED ORDER — HALOPERIDOL LACTATE 5 MG/ML IJ SOLN: 5.0000 mg | INTRAMUSCULAR | Status: DC | PRN

## 2019-07-13 MED ORDER — AMLODIPINE BESYLATE 5 MG PO TABS
5.0000 mg | ORAL_TABLET | Freq: Every day | ORAL | Status: DC
  Administered 2019-07-13 – 2019-07-14 (×2): 5 mg via ORAL
  Filled 2019-07-13 (×3): qty 1

## 2019-07-13 MED ORDER — ROSUVASTATIN CALCIUM 5 MG PO TABS
10.0000 mg | ORAL_TABLET | Freq: Every day | ORAL | Status: DC
  Administered 2019-07-13 – 2019-07-14 (×2): 10 mg via ORAL
  Filled 2019-07-13 (×4): qty 2

## 2019-07-13 MED ORDER — QUETIAPINE FUMARATE 25 MG PO TABS: 12.5000 mg | ORAL_TABLET | Freq: Two times a day (BID) | ORAL | Status: DC

## 2019-07-13 MED ORDER — SOTALOL HCL 80 MG PO TABS
160.0000 mg | ORAL_TABLET | Freq: Two times a day (BID) | ORAL | Status: DC
  Administered 2019-07-13 – 2019-07-14 (×3): 160 mg via ORAL
  Filled 2019-07-13 (×6): qty 2

## 2019-07-13 MED ORDER — PANTOPRAZOLE SODIUM 20 MG PO TBEC
20.0000 mg | DELAYED_RELEASE_TABLET | Freq: Every day | ORAL | Status: DC
  Administered 2019-07-13 – 2019-07-14 (×2): 20 mg via ORAL
  Filled 2019-07-13 (×3): qty 1

## 2019-07-13 MED ORDER — QUETIAPINE FUMARATE 25 MG PO TABS: 25.0000 mg | ORAL_TABLET | Freq: Every day | ORAL | Status: DC

## 2019-07-13 MED ORDER — COMPLETENATE 29-1 MG PO CHEW: 1.0000 | CHEWABLE_TABLET | Freq: Every day | ORAL | Status: DC

## 2019-07-13 MED ORDER — HALOPERIDOL 5 MG PO TABS
5.0000 mg | ORAL_TABLET | ORAL | Status: DC | PRN
  Administered 2019-07-13 – 2019-07-14 (×2): 5 mg via ORAL
  Filled 2019-07-13 (×2): qty 1

## 2019-07-13 NOTE — ED Notes (Signed)
Daughter - Beverlee Nims [phone] - called and advised staff may contact her at any time. Aware waiting for Inpt Placement - per pt request.

## 2019-07-13 NOTE — ED Notes (Signed)
[facility] aware of needing IVC papers served.

## 2019-07-13 NOTE — ED Notes (Signed)
Pt noted to be yelling "Help! They won't let me go!". States she wants to go home. States she has not been home in 20 days. RN attempting to contact pt's spouse so she may talk w/him. No answer.

## 2019-07-13 NOTE — ED Notes (Signed)
[name] called inquiring about pt. Advised they may possibly accept if she is IVC'd. Paperwork given to Dr Johnney Killian.

## 2019-07-13 NOTE — ED Notes (Signed)
Per [facility] NP, hold Seroquel at this time until speaks w/Psychiatrist. Pt noted to be ambulating in [place]. Pt continues w/confusion/delusional. States this is her father's house.

## 2019-07-13 NOTE — ED Notes (Signed)
IVC paperwork served - Copy faxed to [facility] - Copy sent to Medical Records - Original placed in folder for [place] 3 sets on clipboard.

## 2019-07-13 NOTE — ED Notes (Signed)
Re-TTS completed.  

## 2019-07-13 NOTE — ED Notes (Signed)
Per Corene Cornea, NP from [facility], after reviewing EKG post haldol he states we can continue to administer haldol.

## 2019-07-13 NOTE — ED Notes (Signed)
Pt talking w/daugther, Beverlee Nims, on portable phone.

## 2019-07-13 NOTE — Progress Notes (Addendum)
This patient continues to meet inpatient criteria. CSW faxed information to the following facilities:   [facility]- at [facility]- declined due to medical co-morbidities and acuity [facility]- under review, requested IVC for safe transportation if accepted  [facility]- reviewing patient, chest x-ray and ECG faxed over on 10/24  This patient is recommended for gero-psych inpatient treatment and is not appropriate for [facility].  Stephanie Acre, [place] Social Worker [phone]

## 2019-07-13 NOTE — BH Assessment (Signed)
[place] Assessment Progress Note   TTS re-assessment.  Patient still very confused.  She knows she is at the hospital, but thinks that it is 75.  She states that this Probation officer is her husband and that this Probation officer did not come to see her yesterday to take her home.  She states, "I have been in here for ten hours without shoes."  She then starts crying for her Daddy to come and take her home.  Patient denies having thoughts of wanting to die.  Due to the severity of her dementia and confusion, inpatient treatment continues to be recommended.

## 2019-07-13 NOTE — ED Notes (Signed)
Pt noted to be crying - stating she wants "my daddy to come get me, please". Sitter ambulated w/pt around [place]. Pt repeatedly asking to leave and for her parents. Pt spoke w/daughter on phone.

## 2019-07-13 NOTE — ED Notes (Signed)
Pt unable to tolerate EKG at this time d/t irritable.

## 2019-07-13 NOTE — ED Notes (Addendum)
Pt given Ativan d/t pt noted to be restless.
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
## 2019-07-13 NOTE — ED Notes (Signed)
Pt woke from sleeping. Pt noted to be irritable and agitated. Pt placed her hands on Sitter's chest attempting to leave ED. Pt escorted to bathroom by this RN - pt voided then returned to chair outside of room. Pt talking on portable phone w/spouse.

## 2019-07-13 NOTE — ED Notes (Signed)
Pt being transported to X-ray. Sitter w/pt.

## 2019-07-13 NOTE — ED Notes (Signed)
[facility] requesting CXR to be performed received order.

## 2019-07-13 NOTE — Progress Notes (Addendum)
Chest x-ray and ECG faxed to [facility] per request.  CSW following for disposition.   3:15pm- CSW called [facility] and spoke with admissions staff for updates. Admissions has not been able to review faxed information at this time, CSW informed admissions that patient is now under IVC.   Stephanie Acre, LCSW-A Clinical Social Worker

## 2019-07-13 NOTE — ED Notes (Signed)
[facility] NP ordered meds as requested d/t pt's behavior.

## 2019-07-13 NOTE — ED Notes (Signed)
Pt lying on bed w/eyes closed. Respirations even, unlabored. 

## 2019-07-13 NOTE — ED Notes (Signed)
Returned from  X-ray

## 2019-07-13 NOTE — ED Notes (Signed)
[place], [place], aware pt's CXR has resulted.

## 2019-07-13 NOTE — ED Notes (Signed)
Breakfast tray ordered 

## 2019-07-13 NOTE — ED Notes (Signed)
Ativan given d/t pt noted w/increased anxiety - states repeatedly that she wants to go home. Pt refusing to eat breakfast even after much encouragement.

## 2019-07-13 NOTE — ED Notes (Signed)
Lunch tray ordered 

## 2019-07-13 NOTE — ED Notes (Signed)
Ordered bfast 

## 2019-07-14 MED ORDER — HALOPERIDOL LACTATE 5 MG/ML IJ SOLN: 10.0000 mg | Freq: Four times a day (QID) | INTRAMUSCULAR | Status: DC | PRN

## 2019-07-14 MED ORDER — HALOPERIDOL 5 MG PO TABS
5.0000 mg | ORAL_TABLET | Freq: Once | ORAL | Status: AC
  Administered 2019-07-14: 5 mg via ORAL
  Filled 2019-07-14: qty 1

## 2019-07-14 MED ORDER — HALOPERIDOL 5 MG PO TABS
10.0000 mg | ORAL_TABLET | Freq: Four times a day (QID) | ORAL | Status: DC | PRN
  Administered 2019-07-14 – 2019-07-15 (×2): 10 mg via ORAL
  Filled 2019-07-14 (×2): qty 2

## 2019-07-14 MED ORDER — HALOPERIDOL LACTATE 5 MG/ML IJ SOLN: 5.0000 mg | Freq: Once | INTRAMUSCULAR | Status: AC

## 2019-07-14 NOTE — Progress Notes (Signed)
I was contacted about this patient's aggression from the RN.  Patient was started on Haldol p.o. or IM 5 mg every 4 hours as needed for agitation however this does not seem to improve the patient's aggressive episodes.  Patient did have a EKG done at 814 last night and last QTC was 466.  Discussed this case with Dr. Mallie Darting and has been decided to increase the Haldol to 10 mg p.o. or IM every 6 hours as needed and if this does not manage patient's aggression then will add on Tegretol chewable tablets 100 mg every 12 hours as needed.  EKGs need to be continued after each dose of Haldol to monitor QTC intervals due to patient also being on sotalol.  RN Eber Hong has been notified about the recommendations.

## 2019-07-14 NOTE — ED Notes (Addendum)
Pt ambulated to bathroom then back to room w/RN. Pt lying on bed, alert.

## 2019-07-14 NOTE — ED Notes (Signed)
Pt ambulated to bathroom and back to room. Sitter encouraging pt to eat breakfast.

## 2019-07-14 NOTE — ED Notes (Signed)
[facility] NP recommends for an EKG to be performed after each dose of Haldol d/t pt's heart history. EKG completed - given to Dr Ronnald Nian.

## 2019-07-14 NOTE — ED Notes (Signed)
Pt talking w/her daugther, Beverlee Nims, on portable phone.

## 2019-07-14 NOTE — ED Notes (Signed)
Pt rested quietly on bed for brief moment.

## 2019-07-14 NOTE — ED Notes (Signed)
Pt talking w/daughter on portable phone.

## 2019-07-14 NOTE — ED Notes (Signed)
Pt slept for brief moment - continues w/anxiety - ambulated to bathroom w/Sitter then back to room.

## 2019-07-14 NOTE — ED Notes (Signed)
Haldol given d/t pt continues to be anxious and is crying out loudly. Staff attempting to console pt.
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
## 2019-07-14 NOTE — BHH Counselor (Signed)
Adrienne Pickles, NP, spoke with Wells Guiles, RN, concerning client's behavior. Medication recommendation was made.   Adrienne Pickles, NP, patient continues to meet inpatient criteria

## 2019-07-14 NOTE — ED Notes (Signed)
Patient is talking to daughter on the phone

## 2019-07-14 NOTE — ED Notes (Signed)
Pt stating to Sitter she wants to leave and that she has a gun. Pt being encouraged to remain in her room for safety.

## 2019-07-14 NOTE — ED Notes (Signed)
Per shift report, pt did not sleep well last evening. Pt given Haldol d/t pt noted w/increased anxiety and yelling "Help!" States she wants to go home repeatedly. RN and Sitter allowed pt to vent feelings/concerns.

## 2019-07-14 NOTE — ED Notes (Signed)
Pt ambulated to bathroom and back to room. When pt laid back down on stretcher, pt noted to shaking rail of bed and continues to repeatedly state she wants to go home - states she wants to see her parents.

## 2019-07-14 NOTE — ED Notes (Addendum)
Patient calls for help stating she feels that people are trying to hurt her. Patient calms down when reassured and reoriented to the situation.

## 2019-07-14 NOTE — Progress Notes (Signed)
CSW faxed chest X-ray and EKG to [facility] Admissions Dept at request of Caryl Pina in admissions dept. Pt is currently in review.   Eann Cleland S. Ouida Sills, MSW, LCSW Clinical Social Worker 07/14/2019 2:06 PM

## 2019-07-14 NOTE — ED Notes (Signed)
Pt lying on bed pulling on rail and attempting to remove ID bracelet. Pt noted to be irritable.

## 2019-07-14 NOTE — ED Notes (Signed)
Patient calls for help stating she thinks people are trying to hurt her. Patient is able to calm down when reoriented to situation.

## 2019-07-14 NOTE — ED Notes (Signed)
Pt continues w/restlessness and anxiety. After pt voided in bathroom, RN ambulated w/pt around [place] area x 2. Pt returned to room and laid down on bed - pt covered w/blankets.

## 2019-07-15 ENCOUNTER — Other Ambulatory Visit: Payer: Medicare HMO

## 2019-07-15 ENCOUNTER — Ambulatory Visit: Payer: Medicare HMO

## 2019-07-15 ENCOUNTER — Ambulatory Visit: Payer: Medicare HMO | Admitting: Hematology and Oncology

## 2019-07-15 DIAGNOSIS — I10 Essential (primary) hypertension: Secondary | ICD-10-CM | POA: Diagnosis not present

## 2019-07-15 DIAGNOSIS — N390 Urinary tract infection, site not specified: Secondary | ICD-10-CM | POA: Diagnosis not present

## 2019-07-15 DIAGNOSIS — E876 Hypokalemia: Secondary | ICD-10-CM | POA: Diagnosis not present

## 2019-07-15 DIAGNOSIS — I480 Paroxysmal atrial fibrillation: Secondary | ICD-10-CM | POA: Diagnosis not present

## 2019-07-15 DIAGNOSIS — E559 Vitamin D deficiency, unspecified: Secondary | ICD-10-CM | POA: Diagnosis not present

## 2019-07-15 DIAGNOSIS — Z8673 Personal history of transient ischemic attack (TIA), and cerebral infarction without residual deficits: Secondary | ICD-10-CM | POA: Diagnosis not present

## 2019-07-15 DIAGNOSIS — E785 Hyperlipidemia, unspecified: Secondary | ICD-10-CM | POA: Diagnosis not present

## 2019-07-15 DIAGNOSIS — Z1159 Encounter for screening for other viral diseases: Secondary | ICD-10-CM | POA: Diagnosis not present

## 2019-07-15 DIAGNOSIS — R69 Illness, unspecified: Secondary | ICD-10-CM | POA: Diagnosis not present

## 2019-07-15 DIAGNOSIS — N3000 Acute cystitis without hematuria: Secondary | ICD-10-CM | POA: Diagnosis not present

## 2019-07-15 DIAGNOSIS — K219 Gastro-esophageal reflux disease without esophagitis: Secondary | ICD-10-CM | POA: Diagnosis not present

## 2019-07-15 DIAGNOSIS — I2510 Atherosclerotic heart disease of native coronary artery without angina pectoris: Secondary | ICD-10-CM | POA: Diagnosis not present

## 2019-07-15 DIAGNOSIS — T7840XS Allergy, unspecified, sequela: Secondary | ICD-10-CM | POA: Diagnosis not present

## 2019-07-15 DIAGNOSIS — F0151 Vascular dementia with behavioral disturbance: Secondary | ICD-10-CM | POA: Diagnosis not present

## 2019-07-15 DIAGNOSIS — F0281 Dementia in other diseases classified elsewhere with behavioral disturbance: Secondary | ICD-10-CM | POA: Diagnosis not present

## 2019-07-15 DIAGNOSIS — G301 Alzheimer's disease with late onset: Secondary | ICD-10-CM | POA: Diagnosis not present

## 2019-07-15 NOTE — ED Provider Notes (Signed)
Patient had a an EKG overnight had a prolonged QT that was of some concern.  It was 500 ms.  Repeat EKG this morning it is 481 which is less concerning.  Patient is on medication that prolong QT so we will need to continue to watch it but she can have her medication.   Fredia Sorrow, MD 07/15/19 1120

## 2019-07-15 NOTE — ED Notes (Addendum)
Message left for sheriff r/t transport to [facility].

## 2019-07-15 NOTE — Progress Notes (Addendum)
CSW received phone call from admissions at [facility] who stated that acceptance information would be given once they received pt's HCPOA paperwork. CSW contacted pt's husband [name] [phone]), who is her HCPOA, and informed of disposition. Mr. Castiglioni stated he was unfamiliar with [facility] and would like to learn more about their "Covid statistics" before allowing her to transfer to another hospital. He will call [facility] to talk with them and will call CSW back after he looks for [facility] paperwork.   A HIPAA compliant message was also left with pt's daughter Beverlee Nims requesting a return phone call.   Audree Camel, LCSW, LCAS Disposition CSW [facility]/TTS [phone]  UPDATE: Pt is currently under IVC. [facility] has been informed. They will call CSW back after discharges have been made and bed become available.

## 2019-07-15 NOTE — BH Assessment (Signed)
Received a call from [facility] with [facility] ([place] States that patient was accepted to their facility by Dr. Geanie Kenning. Assigned to room 426B. Nurse report [phone]. Patient's nurse/Ashley made aware of the updated disposition.

## 2019-07-15 NOTE — ED Notes (Signed)
Patient did not eat her breakfast.and refusing to eat lunch,

## 2019-07-15 NOTE — ED Notes (Signed)
Sheriff on unit to transfer pt to [facility] per MD order. Personal property given to sheriff for transport. Ambulatory off unit in law enforcement custody.

## 2019-07-15 NOTE — ED Notes (Signed)
Family at bedside. 

## 2019-07-16 DIAGNOSIS — G301 Alzheimer's disease with late onset: Secondary | ICD-10-CM | POA: Diagnosis not present

## 2019-07-16 DIAGNOSIS — F0281 Dementia in other diseases classified elsewhere with behavioral disturbance: Secondary | ICD-10-CM | POA: Diagnosis not present

## 2019-07-16 DIAGNOSIS — I2510 Atherosclerotic heart disease of native coronary artery without angina pectoris: Secondary | ICD-10-CM | POA: Diagnosis not present

## 2019-07-16 DIAGNOSIS — T7840XS Allergy, unspecified, sequela: Secondary | ICD-10-CM | POA: Diagnosis not present

## 2019-07-16 DIAGNOSIS — E876 Hypokalemia: Secondary | ICD-10-CM | POA: Diagnosis not present

## 2019-07-16 DIAGNOSIS — I10 Essential (primary) hypertension: Secondary | ICD-10-CM | POA: Diagnosis not present

## 2019-07-16 DIAGNOSIS — E785 Hyperlipidemia, unspecified: Secondary | ICD-10-CM | POA: Diagnosis not present

## 2019-07-16 DIAGNOSIS — K219 Gastro-esophageal reflux disease without esophagitis: Secondary | ICD-10-CM | POA: Diagnosis not present

## 2019-07-16 DIAGNOSIS — E559 Vitamin D deficiency, unspecified: Secondary | ICD-10-CM | POA: Diagnosis not present

## 2019-07-17 DIAGNOSIS — F0281 Dementia in other diseases classified elsewhere with behavioral disturbance: Secondary | ICD-10-CM | POA: Diagnosis not present

## 2019-07-17 DIAGNOSIS — G301 Alzheimer's disease with late onset: Secondary | ICD-10-CM | POA: Diagnosis not present

## 2019-07-18 DIAGNOSIS — I10 Essential (primary) hypertension: Secondary | ICD-10-CM | POA: Diagnosis not present

## 2019-07-18 DIAGNOSIS — K219 Gastro-esophageal reflux disease without esophagitis: Secondary | ICD-10-CM | POA: Diagnosis not present

## 2019-07-18 DIAGNOSIS — G301 Alzheimer's disease with late onset: Secondary | ICD-10-CM | POA: Diagnosis not present

## 2019-07-18 DIAGNOSIS — E785 Hyperlipidemia, unspecified: Secondary | ICD-10-CM | POA: Diagnosis not present

## 2019-07-18 DIAGNOSIS — Z8673 Personal history of transient ischemic attack (TIA), and cerebral infarction without residual deficits: Secondary | ICD-10-CM | POA: Diagnosis not present

## 2019-07-18 DIAGNOSIS — E876 Hypokalemia: Secondary | ICD-10-CM | POA: Diagnosis not present

## 2019-07-18 DIAGNOSIS — I480 Paroxysmal atrial fibrillation: Secondary | ICD-10-CM | POA: Diagnosis not present

## 2019-07-18 DIAGNOSIS — F0281 Dementia in other diseases classified elsewhere with behavioral disturbance: Secondary | ICD-10-CM | POA: Diagnosis not present

## 2019-07-18 DIAGNOSIS — N3000 Acute cystitis without hematuria: Secondary | ICD-10-CM | POA: Diagnosis not present

## 2019-07-18 DIAGNOSIS — E559 Vitamin D deficiency, unspecified: Secondary | ICD-10-CM | POA: Diagnosis not present

## 2019-07-19 DIAGNOSIS — F0281 Dementia in other diseases classified elsewhere with behavioral disturbance: Secondary | ICD-10-CM | POA: Diagnosis not present

## 2019-07-19 DIAGNOSIS — G301 Alzheimer's disease with late onset: Secondary | ICD-10-CM | POA: Diagnosis not present

## 2019-07-20 DIAGNOSIS — E785 Hyperlipidemia, unspecified: Secondary | ICD-10-CM | POA: Diagnosis not present

## 2019-07-20 DIAGNOSIS — I10 Essential (primary) hypertension: Secondary | ICD-10-CM | POA: Diagnosis not present

## 2019-07-20 DIAGNOSIS — K219 Gastro-esophageal reflux disease without esophagitis: Secondary | ICD-10-CM | POA: Diagnosis not present

## 2019-07-20 DIAGNOSIS — F0281 Dementia in other diseases classified elsewhere with behavioral disturbance: Secondary | ICD-10-CM | POA: Diagnosis not present

## 2019-07-20 DIAGNOSIS — E559 Vitamin D deficiency, unspecified: Secondary | ICD-10-CM | POA: Diagnosis not present

## 2019-07-20 DIAGNOSIS — I480 Paroxysmal atrial fibrillation: Secondary | ICD-10-CM | POA: Diagnosis not present

## 2019-07-20 DIAGNOSIS — G301 Alzheimer's disease with late onset: Secondary | ICD-10-CM | POA: Diagnosis not present

## 2019-07-20 DIAGNOSIS — Z8673 Personal history of transient ischemic attack (TIA), and cerebral infarction without residual deficits: Secondary | ICD-10-CM | POA: Diagnosis not present

## 2019-07-20 DIAGNOSIS — N3000 Acute cystitis without hematuria: Secondary | ICD-10-CM | POA: Diagnosis not present

## 2019-07-20 DIAGNOSIS — E876 Hypokalemia: Secondary | ICD-10-CM | POA: Diagnosis not present

## 2019-07-21 DIAGNOSIS — R69 Illness, unspecified: Secondary | ICD-10-CM | POA: Diagnosis not present

## 2019-07-21 DIAGNOSIS — G301 Alzheimer's disease with late onset: Secondary | ICD-10-CM | POA: Diagnosis not present

## 2019-07-22 DIAGNOSIS — I10 Essential (primary) hypertension: Secondary | ICD-10-CM | POA: Diagnosis not present

## 2019-07-22 DIAGNOSIS — K219 Gastro-esophageal reflux disease without esophagitis: Secondary | ICD-10-CM | POA: Diagnosis not present

## 2019-07-22 DIAGNOSIS — E876 Hypokalemia: Secondary | ICD-10-CM | POA: Diagnosis not present

## 2019-07-22 DIAGNOSIS — G301 Alzheimer's disease with late onset: Secondary | ICD-10-CM | POA: Diagnosis not present

## 2019-07-22 DIAGNOSIS — F0281 Dementia in other diseases classified elsewhere with behavioral disturbance: Secondary | ICD-10-CM | POA: Diagnosis not present

## 2019-07-22 DIAGNOSIS — Z8673 Personal history of transient ischemic attack (TIA), and cerebral infarction without residual deficits: Secondary | ICD-10-CM | POA: Diagnosis not present

## 2019-07-22 DIAGNOSIS — E559 Vitamin D deficiency, unspecified: Secondary | ICD-10-CM | POA: Diagnosis not present

## 2019-07-22 DIAGNOSIS — N3000 Acute cystitis without hematuria: Secondary | ICD-10-CM | POA: Diagnosis not present

## 2019-07-22 DIAGNOSIS — E785 Hyperlipidemia, unspecified: Secondary | ICD-10-CM | POA: Diagnosis not present

## 2019-07-22 DIAGNOSIS — I480 Paroxysmal atrial fibrillation: Secondary | ICD-10-CM | POA: Diagnosis not present

## 2019-07-23 DIAGNOSIS — F0281 Dementia in other diseases classified elsewhere with behavioral disturbance: Secondary | ICD-10-CM | POA: Diagnosis not present

## 2019-07-23 DIAGNOSIS — G301 Alzheimer's disease with late onset: Secondary | ICD-10-CM | POA: Diagnosis not present

## 2019-07-24 DIAGNOSIS — N3000 Acute cystitis without hematuria: Secondary | ICD-10-CM | POA: Diagnosis not present

## 2019-07-24 DIAGNOSIS — Z8673 Personal history of transient ischemic attack (TIA), and cerebral infarction without residual deficits: Secondary | ICD-10-CM | POA: Diagnosis not present

## 2019-07-24 DIAGNOSIS — I480 Paroxysmal atrial fibrillation: Secondary | ICD-10-CM | POA: Diagnosis not present

## 2019-07-24 DIAGNOSIS — F0281 Dementia in other diseases classified elsewhere with behavioral disturbance: Secondary | ICD-10-CM | POA: Diagnosis not present

## 2019-07-24 DIAGNOSIS — K219 Gastro-esophageal reflux disease without esophagitis: Secondary | ICD-10-CM | POA: Diagnosis not present

## 2019-07-24 DIAGNOSIS — E559 Vitamin D deficiency, unspecified: Secondary | ICD-10-CM | POA: Diagnosis not present

## 2019-07-24 DIAGNOSIS — E876 Hypokalemia: Secondary | ICD-10-CM | POA: Diagnosis not present

## 2019-07-24 DIAGNOSIS — G301 Alzheimer's disease with late onset: Secondary | ICD-10-CM | POA: Diagnosis not present

## 2019-07-24 DIAGNOSIS — I10 Essential (primary) hypertension: Secondary | ICD-10-CM | POA: Diagnosis not present

## 2019-07-24 DIAGNOSIS — E785 Hyperlipidemia, unspecified: Secondary | ICD-10-CM | POA: Diagnosis not present

## 2019-07-25 DIAGNOSIS — F0281 Dementia in other diseases classified elsewhere with behavioral disturbance: Secondary | ICD-10-CM | POA: Diagnosis not present

## 2019-07-25 DIAGNOSIS — G301 Alzheimer's disease with late onset: Secondary | ICD-10-CM | POA: Diagnosis not present

## 2019-07-26 DIAGNOSIS — Z8673 Personal history of transient ischemic attack (TIA), and cerebral infarction without residual deficits: Secondary | ICD-10-CM | POA: Diagnosis not present

## 2019-07-26 DIAGNOSIS — G301 Alzheimer's disease with late onset: Secondary | ICD-10-CM | POA: Diagnosis not present

## 2019-07-26 DIAGNOSIS — I2510 Atherosclerotic heart disease of native coronary artery without angina pectoris: Secondary | ICD-10-CM | POA: Diagnosis not present

## 2019-07-26 DIAGNOSIS — E785 Hyperlipidemia, unspecified: Secondary | ICD-10-CM | POA: Diagnosis not present

## 2019-07-26 DIAGNOSIS — K219 Gastro-esophageal reflux disease without esophagitis: Secondary | ICD-10-CM | POA: Diagnosis not present

## 2019-07-26 DIAGNOSIS — R69 Illness, unspecified: Secondary | ICD-10-CM | POA: Diagnosis not present

## 2019-07-26 DIAGNOSIS — I480 Paroxysmal atrial fibrillation: Secondary | ICD-10-CM | POA: Diagnosis not present

## 2019-07-26 DIAGNOSIS — F0281 Dementia in other diseases classified elsewhere with behavioral disturbance: Secondary | ICD-10-CM | POA: Diagnosis not present

## 2019-07-26 DIAGNOSIS — I10 Essential (primary) hypertension: Secondary | ICD-10-CM | POA: Diagnosis not present

## 2019-07-27 DIAGNOSIS — F0281 Dementia in other diseases classified elsewhere with behavioral disturbance: Secondary | ICD-10-CM | POA: Diagnosis not present

## 2019-07-27 DIAGNOSIS — G301 Alzheimer's disease with late onset: Secondary | ICD-10-CM | POA: Diagnosis not present

## 2019-07-28 DIAGNOSIS — F0281 Dementia in other diseases classified elsewhere with behavioral disturbance: Secondary | ICD-10-CM | POA: Diagnosis not present

## 2019-07-28 DIAGNOSIS — R69 Illness, unspecified: Secondary | ICD-10-CM | POA: Diagnosis not present

## 2019-07-28 DIAGNOSIS — K219 Gastro-esophageal reflux disease without esophagitis: Secondary | ICD-10-CM | POA: Diagnosis not present

## 2019-07-28 DIAGNOSIS — G301 Alzheimer's disease with late onset: Secondary | ICD-10-CM | POA: Diagnosis not present

## 2019-07-28 DIAGNOSIS — E785 Hyperlipidemia, unspecified: Secondary | ICD-10-CM | POA: Diagnosis not present

## 2019-07-28 DIAGNOSIS — I2510 Atherosclerotic heart disease of native coronary artery without angina pectoris: Secondary | ICD-10-CM | POA: Diagnosis not present

## 2019-07-28 DIAGNOSIS — Z8673 Personal history of transient ischemic attack (TIA), and cerebral infarction without residual deficits: Secondary | ICD-10-CM | POA: Diagnosis not present

## 2019-07-28 DIAGNOSIS — I480 Paroxysmal atrial fibrillation: Secondary | ICD-10-CM | POA: Diagnosis not present

## 2019-07-28 DIAGNOSIS — I10 Essential (primary) hypertension: Secondary | ICD-10-CM | POA: Diagnosis not present

## 2019-07-30 DIAGNOSIS — Z8673 Personal history of transient ischemic attack (TIA), and cerebral infarction without residual deficits: Secondary | ICD-10-CM | POA: Diagnosis not present

## 2019-07-30 DIAGNOSIS — G301 Alzheimer's disease with late onset: Secondary | ICD-10-CM | POA: Diagnosis not present

## 2019-07-30 DIAGNOSIS — I2510 Atherosclerotic heart disease of native coronary artery without angina pectoris: Secondary | ICD-10-CM | POA: Diagnosis not present

## 2019-07-30 DIAGNOSIS — R69 Illness, unspecified: Secondary | ICD-10-CM | POA: Diagnosis not present

## 2019-07-30 DIAGNOSIS — I10 Essential (primary) hypertension: Secondary | ICD-10-CM | POA: Diagnosis not present

## 2019-07-30 DIAGNOSIS — K219 Gastro-esophageal reflux disease without esophagitis: Secondary | ICD-10-CM | POA: Diagnosis not present

## 2019-07-30 DIAGNOSIS — E785 Hyperlipidemia, unspecified: Secondary | ICD-10-CM | POA: Diagnosis not present

## 2019-07-30 DIAGNOSIS — I480 Paroxysmal atrial fibrillation: Secondary | ICD-10-CM | POA: Diagnosis not present

## 2019-07-31 DIAGNOSIS — E785 Hyperlipidemia, unspecified: Secondary | ICD-10-CM | POA: Diagnosis not present

## 2019-07-31 DIAGNOSIS — I2510 Atherosclerotic heart disease of native coronary artery without angina pectoris: Secondary | ICD-10-CM | POA: Diagnosis not present

## 2019-07-31 DIAGNOSIS — I10 Essential (primary) hypertension: Secondary | ICD-10-CM | POA: Diagnosis not present

## 2019-07-31 DIAGNOSIS — I480 Paroxysmal atrial fibrillation: Secondary | ICD-10-CM | POA: Diagnosis not present

## 2019-07-31 DIAGNOSIS — Z8673 Personal history of transient ischemic attack (TIA), and cerebral infarction without residual deficits: Secondary | ICD-10-CM | POA: Diagnosis not present

## 2019-07-31 DIAGNOSIS — R69 Illness, unspecified: Secondary | ICD-10-CM | POA: Diagnosis not present

## 2019-07-31 DIAGNOSIS — G301 Alzheimer's disease with late onset: Secondary | ICD-10-CM | POA: Diagnosis not present

## 2019-07-31 DIAGNOSIS — K219 Gastro-esophageal reflux disease without esophagitis: Secondary | ICD-10-CM | POA: Diagnosis not present

## 2019-08-07 DIAGNOSIS — E782 Mixed hyperlipidemia: Secondary | ICD-10-CM | POA: Diagnosis not present

## 2019-08-07 DIAGNOSIS — Z23 Encounter for immunization: Secondary | ICD-10-CM | POA: Diagnosis not present

## 2019-08-07 DIAGNOSIS — R69 Illness, unspecified: Secondary | ICD-10-CM | POA: Diagnosis not present

## 2019-08-07 DIAGNOSIS — Z0000 Encounter for general adult medical examination without abnormal findings: Secondary | ICD-10-CM | POA: Diagnosis not present

## 2019-08-07 DIAGNOSIS — K219 Gastro-esophageal reflux disease without esophagitis: Secondary | ICD-10-CM | POA: Diagnosis not present

## 2019-08-07 DIAGNOSIS — I2510 Atherosclerotic heart disease of native coronary artery without angina pectoris: Secondary | ICD-10-CM | POA: Diagnosis not present

## 2019-08-07 DIAGNOSIS — Z1389 Encounter for screening for other disorder: Secondary | ICD-10-CM | POA: Diagnosis not present

## 2019-08-07 DIAGNOSIS — D693 Immune thrombocytopenic purpura: Secondary | ICD-10-CM | POA: Diagnosis not present

## 2019-08-07 DIAGNOSIS — I4891 Unspecified atrial fibrillation: Secondary | ICD-10-CM | POA: Diagnosis not present

## 2019-08-07 DIAGNOSIS — I10 Essential (primary) hypertension: Secondary | ICD-10-CM | POA: Diagnosis not present

## 2019-08-08 ENCOUNTER — Emergency Department (HOSPITAL_COMMUNITY)
Admission: EM | Admit: 2019-08-08 | Discharge: 2019-08-08 | Disposition: A | Discharge status: Home / Self Care | Payer: Medicare HMO | Attending: Emergency Medicine | Admitting: Emergency Medicine

## 2019-08-08 ENCOUNTER — Other Ambulatory Visit: Payer: Self-pay

## 2019-08-08 ENCOUNTER — Encounter (HOSPITAL_COMMUNITY): Payer: Self-pay | Admitting: Emergency Medicine

## 2019-08-08 ENCOUNTER — Emergency Department (HOSPITAL_COMMUNITY): Payer: Medicare HMO

## 2019-08-08 DIAGNOSIS — Y999 Unspecified external cause status: Secondary | ICD-10-CM | POA: Diagnosis not present

## 2019-08-08 DIAGNOSIS — R609 Edema, unspecified: Secondary | ICD-10-CM | POA: Diagnosis not present

## 2019-08-08 DIAGNOSIS — S0990XA Unspecified injury of head, initial encounter: Secondary | ICD-10-CM

## 2019-08-08 DIAGNOSIS — I2510 Atherosclerotic heart disease of native coronary artery without angina pectoris: Secondary | ICD-10-CM | POA: Insufficient documentation

## 2019-08-08 DIAGNOSIS — W010XXA Fall on same level from slipping, tripping and stumbling without subsequent striking against object, initial encounter: Secondary | ICD-10-CM | POA: Insufficient documentation

## 2019-08-08 DIAGNOSIS — Z79899 Other long term (current) drug therapy: Secondary | ICD-10-CM | POA: Diagnosis not present

## 2019-08-08 DIAGNOSIS — Y929 Unspecified place or not applicable: Secondary | ICD-10-CM | POA: Insufficient documentation

## 2019-08-08 DIAGNOSIS — Y9389 Activity, other specified: Secondary | ICD-10-CM | POA: Insufficient documentation

## 2019-08-08 DIAGNOSIS — R4182 Altered mental status, unspecified: Secondary | ICD-10-CM | POA: Diagnosis not present

## 2019-08-08 DIAGNOSIS — W19XXXA Unspecified fall, initial encounter: Secondary | ICD-10-CM | POA: Diagnosis not present

## 2019-08-08 DIAGNOSIS — M25511 Pain in right shoulder: Secondary | ICD-10-CM | POA: Diagnosis not present

## 2019-08-08 DIAGNOSIS — I959 Hypotension, unspecified: Secondary | ICD-10-CM | POA: Diagnosis not present

## 2019-08-08 DIAGNOSIS — M7989 Other specified soft tissue disorders: Secondary | ICD-10-CM | POA: Diagnosis not present

## 2019-08-08 DIAGNOSIS — R001 Bradycardia, unspecified: Secondary | ICD-10-CM | POA: Diagnosis not present

## 2019-08-08 DIAGNOSIS — S0003XA Contusion of scalp, initial encounter: Secondary | ICD-10-CM | POA: Diagnosis not present

## 2019-08-08 DIAGNOSIS — S4991XA Unspecified injury of right shoulder and upper arm, initial encounter: Secondary | ICD-10-CM | POA: Diagnosis not present

## 2019-08-08 DIAGNOSIS — R58 Hemorrhage, not elsewhere classified: Secondary | ICD-10-CM | POA: Diagnosis not present

## 2019-08-08 LAB — CBG MONITORING, ED: Glucose-Capillary: 88 mg/dL (ref 70–99)

## 2019-08-08 NOTE — ED Provider Notes (Addendum)
[facility] EMERGENCY DEPARTMENT Provider Note   CSN: [id number] Arrival date & time: 08/08/19  1521     History   Chief Complaint Chief Complaint  Patient presents with  . Fall    HPI [name] is a 85 y.o. female.     85 yo F with a cc of a fall from standing. Per the husband tried to turn around and got her feet twisted up and fell.  Struck the back of her head.  No other noted area of injury.  Patient went to urgent care and was referred here for CT imaging of the head.  Since then the patient started complaining of some right shoulder pain.  Denies chest pain denies shortness of breath denies abdominal pain denies back pain denies neck pain denies lower extremity pain.  Patient has dementia which limits her history.  Level 5 caveat.  The history is provided by the patient.  Fall This is a new problem. The current episode started 2 days ago. The problem occurs constantly. The problem has not changed since onset.Associated symptoms include headaches. Pertinent negatives include no chest pain, no abdominal pain and no shortness of breath. Nothing aggravates the symptoms. Nothing relieves the symptoms. She has tried nothing for the symptoms. The treatment provided no relief.    Past Medical History:  Diagnosis Date  . Bilateral hearing loss   . CAD (coronary artery disease) 2003   on Plavix.   . CVA (cerebral vascular accident) ([place]) 2010  . Fatigue   . Gross hematuria 07/03/2015  . Hemorrhagic stroke ([place]) 2010  . Hiatal hernia   . Hypercholesteremia   . ITP (idiopathic thrombocytopenic purpura) 01/04/2016  . Kidney stones   . Osteoporosis   . Pancreatitis 1970  . Pancreatitis   . Thrombocytopenia ([place])   . Unspecified deficiency anemia     Patient Active Problem List   Diagnosis Date Noted  . Deficiency anemia 05/20/2019  . Acute anemia 05/07/2019  . Recurrent vertigo 11/02/2018  . Seasonal allergies 07/12/2018  . Hives of unknown  origin 03/13/2018  . Financial difficulties 01/16/2018  . Poor memory 11/17/2017  . Bleeding hemorrhoid 09/22/2017  . Port-A-Cath in place 08/25/2017  . Poor venous access 05/05/2017  . Chronic ITP (idiopathic thrombocytopenia) (H[facility]) 05/02/2017  . Hyperlipidemia LDL goal <70 09/29/2016  . Idiopathic thrombocytopenic purpura ([place]) 01/04/2016  . S/P mitral valve repair 04/26/2015  . Hypertrophic obstructive cardiomyopathy ([place]) 04/08/2014  . Intracranial hemorrhage ([place]) 04/08/2014  . Paroxysmal atrial fibrillation ([place]) 04/08/2014  . Thrombocytopenia ([place])   . Other fatigue   . Unspecified deficiency anemia     Past Surgical History:  Procedure Laterality Date  . APPENDECTOMY    . CARDIAC VALVE SURGERY  2010  . CATARACT EXTRACTION  2017   x 2  . COLONOSCOPY     [facility], Dr. Wynetta Emery  . COLONOSCOPY WITH PROPOFOL N/A 04/09/2013   Procedure: COLONOSCOPY WITH PROPOFOL;  Surgeon: Garlan Fair, MD;  Location: [facility] ENDOSCOPY;  Service: Endoscopy;  Laterality: N/A;  . CORONARY ARTERY BYPASS GRAFT  06/2009  . FLEXIBLE SIGMOIDOSCOPY N/A 08/03/2015   Procedure: FLEXIBLE SIGMOIDOSCOPY;  Surgeon: Garlan Fair, MD;  Location: [facility] ENDOSCOPY;  Service: Endoscopy;  Laterality: N/A;  . IR FLUORO GUIDE PORT INSERTION RIGHT  07/11/2017  . IR US GUIDE VASC ACCESS RIGHT  07/11/2017  . TUBAL LIGATION       OB History   No obstetric history on file.  Home Medications    Prior to Admission medications   Medication Sig Start Date End Date Taking? Authorizing Provider  amLODipine (NORVASC) 5 MG tablet Take 1 tablet (5 mg total) by mouth daily. 09/29/16 07/13/19  Belva Crome, MD  EPINEPHrine (ADRENALIN) 30 MG/30ML SOLN injection Inject 1 mg as directed See admin instructions. As directed for emergency    [provider]  ezetimibe (ZETIA) 10 MG tablet Take 10 mg by mouth daily.    [provider]  meclizine (ANTIVERT) 25 MG tablet Take 25 mg by mouth 3 (three) times  daily as needed for dizziness.    [provider]  omeprazole (PRILOSEC) 40 MG capsule Take 40 mg by mouth daily.    [provider]  QUEtiapine (SEROQUEL) 25 MG tablet Take 25 mg by mouth 2 (two) times daily.    [provider]  risperiDONE (RISPERDAL) 0.25 MG tablet Take 0.25 mg by mouth at bedtime. Take 1 tablet (0.25 mg totally) by mouth at bed time; then go to twice daily for 30 days    [provider]  rosuvastatin (CRESTOR) 10 MG tablet Take 10 mg by mouth daily.    [provider]  sotalol (BETAPACE) 160 MG tablet Take 320 mg by mouth 2 (two) times daily.    [provider]    Family History Family History  Problem Relation Age of Onset  . Cancer Mother 24       colon  . Heart attack Father   . Cancer Sister        colon, cervical  . Stroke Sister   . Memory loss Sister   . Cancer Brother        lung  . Dementia Brother     Social History Social History   Tobacco Use  . Smoking status: Never Smoker  . Smokeless tobacco: Never Used  Substance Use Topics  . Alcohol use: No  . Drug use: No     Allergies   Bee venom, Iodides, Iodinated diagnostic agents, Lyrica [pregabalin], Codeine, Demerol [meperidine], Ambien [zolpidem tartrate], Aspirin, Celebrex [celecoxib], Darvocet [propoxyphene n-acetaminophen], Fosamax [alendronate sodium], Lipitor [atorvastatin], Morphine and related, Propoxyphene, Valium [diazepam], and Iodine   Review of Systems Review of Systems  Constitutional: Negative for chills and fever.  HENT: Negative for congestion and rhinorrhea.   Eyes: Negative for redness and visual disturbance.  Respiratory: Negative for shortness of breath and wheezing.   Cardiovascular: Negative for chest pain and palpitations.  Gastrointestinal: Negative for abdominal pain, nausea and vomiting.  Genitourinary: Negative for dysuria and urgency.  Musculoskeletal: Positive for arthralgias (R shoulder pain). Negative for  myalgias.  Skin: Negative for pallor and wound.  Neurological: Positive for headaches. Negative for dizziness.     Physical Exam Updated Vital Signs BP (!) 149/64   Pulse (!) 51   Temp 98 F (36.7 C) (Oral)   Resp 19   SpO2 100%   Physical Exam Vitals signs and nursing note reviewed.  Constitutional:      General: She is not in acute distress.    Appearance: She is well-developed. She is not diaphoretic.  HENT:     Head: Normocephalic.     Comments: Small hematoma to the posterior occiput.  No open wound. Eyes:     Pupils: Pupils are equal, round, and reactive to light.  Neck:     Musculoskeletal: Normal range of motion and neck supple.  Cardiovascular:     Rate and Rhythm: Normal rate and regular  rhythm.     Heart sounds: No murmur. No friction rub. No gallop.   Pulmonary:     Effort: Pulmonary effort is normal.     Breath sounds: No wheezing or rales.  Abdominal:     General: There is no distension.     Palpations: Abdomen is soft.     Tenderness: There is no abdominal tenderness.  Musculoskeletal:        General: No tenderness.     Comments: Palpated from head to toe without any obvious areas of bony tenderness.  No pain to the right proximal humerus full range of motion of the right shoulder.  No tenderness along the clavicle the [facility] joint the elbow the wrist or the hand.  She does have a Band-Aid to the right deltoid.  Skin:    General: Skin is warm and dry.  Neurological:     Mental Status: She is alert and oriented to person, place, and time.  Psychiatric:        Behavior: Behavior normal.      ED Treatments / Results  Labs (all labs ordered are listed, but only abnormal results are displayed) Labs Reviewed  CBG MONITORING, ED    EKG EKG Interpretation  Date/Time:  Thursday August 08 2019 15:32:54 EST Ventricular Rate:  52 PR Interval:    QRS Duration: 118 QT Interval:  528 QTC Calculation: 492 R Axis:   -8 Text Interpretation: Sinus rhythm  Left ventricular hypertrophy Anterior infarct, old no wpw, brugada or prolonged qt Otherwise no significant change Confirmed by Deno Etienne [id number]) on 08/08/2019 3:34:58 PM   Radiology Dg Shoulder Right  Result Date: 08/08/2019 CLINICAL DATA:  Witnessed fall, shoulder pain EXAM: RIGHT SHOULDER - 2+ VIEW COMPARISON:  Chest radiograph 07/13/2019 FINDINGS: The osseous structures appear diffusely demineralized which may limit detection of small or nondisplaced fractures. No acute bony abnormality. Specifically, no fracture, subluxation, or dislocation. Question some mild soft tissue swelling both at the acromioclavicular joint and laterally. Right IJ approach Port-A-Cath is noted as are sternotomy sutures. Otherwise the included portions of the right chest are unremarkable. IMPRESSION: Mild focal swelling at the right acromioclavicular joint, correlate for point tenderness to exclude a [place] type 1 AC joint injury. Remaining osseous structures are unremarkable. Electronically Signed   By: Lovena Le M.D.   On: 08/08/2019 16:04   Ct Head Wo Contrast  Result Date: 08/08/2019 CLINICAL DATA:  Fall.  Altered mental status. EXAM: CT HEAD WITHOUT CONTRAST TECHNIQUE: Contiguous axial images were obtained from the base of the skull through the vertex without intravenous contrast. COMPARISON:  MR brain dated March 25, 2019. CT head dated March 24, 2019. FINDINGS: Brain: No evidence of acute infarction, hemorrhage, hydrocephalus, extra-axial collection or mass lesion/mass effect. Chronic infarcts involving the right frontal lobe, right insula, right temporal lobe, and left occipital lobe are again noted. Stable atrophy and chronic microvascular ischemic changes. Vascular: Atherosclerotic vascular calcification of the carotid siphons. No hyperdense vessel. Skull: Normal. Negative for fracture or focal lesion. Sinuses/Orbits: No acute finding. Chronic right maxillary sinus opacification. Other: Moderate right posterior  parietal scalp hematoma. IMPRESSION: 1. No acute intracranial abnormality. Moderate right posterior parietal scalp hematoma. 2. Chronic infarcts as above. Electronically Signed   By: Titus Dubin M.D.   On: 08/08/2019 16:26    Procedures Procedures (including critical care time)  Medications Ordered in ED Medications - No data to display   Initial Impression / Assessment and Plan / ED Course  I have reviewed  the triage vital signs and the nursing notes.  Pertinent labs & imaging results that were available during my care of the patient were reviewed by me and considered in my medical decision making (see chart for details).  Clinical Course as of Aug 08 800  Thu Aug 08, 2019  1707 No point tenderness at A/C joint to correlate with xray findings.  Patient feeling at baseline.  Will discharge her with her husband   [MT]    Clinical Course User Index [MT] Trifan, Carola Rhine, MD       85 yo F with a chief complaints of a fall from standing.  Nonsyncopal in nature.  Will obtain a CT scan of the head.  Patient is also complaining of right shoulder pain will obtain a plain film though I suspect this may be secondary to her influenza vaccine that was given yesterday.  Signed out to Dr. Langston Masker, please see their note for further details of care.   The patients results and plan were reviewed and discussed.   Any x-rays performed were independently reviewed by myself.   Differential diagnosis were considered with the presenting HPI.  Medications - No data to display  Vitals:   08/08/19 1527 08/08/19 1531 08/08/19 1619  BP:  (!) 141/57 (!) 149/64  Pulse:  (!) 51 (!) 51  Resp:  16 19  Temp:  98 F (36.7 C)   TempSrc:  Oral   SpO2: 99% 100% 100%    Final diagnoses:  Injury of head, initial encounter  Fall, initial encounter      Final Clinical Impressions(s) / ED Diagnoses   Final diagnoses:  Injury of head, initial encounter  Fall, initial encounter    ED Discharge  Orders    None       Deno Etienne, DO 08/08/19 [place], [place], DO 08/09/19 [phone]

## 2019-08-08 NOTE — ED Notes (Signed)
Pt caregiver verbalized understanding of discharge instructions. Follow up care reviewed, no questions at this time.

## 2019-08-08 NOTE — Discharge Instructions (Addendum)
Follow up with your family doc.  Return for worsening confusion, vomiting.

## 2019-08-08 NOTE — ED Notes (Signed)
CBG Results of 88 reported to [place], Therapist, sports.

## 2019-08-08 NOTE — ED Notes (Signed)
Patient transported to X-ray 

## 2019-08-08 NOTE — ED Triage Notes (Signed)
Pt here via EMS from [facility], husband witnessed fall, pt hit back of her head (swelling noted), reports right shoulder pain. Denies LOC, no neck or back pain.  Has dementia and is oriented at base line. No blood thinner.

## 2019-08-13 ENCOUNTER — Telehealth: Payer: Self-pay

## 2019-08-13 NOTE — Telephone Encounter (Signed)
Received Palliative Care Referral. Phone call placed to patient's husband to introduce Palliative Care and to schedule visit with NP. Phone rang with out answer or with VM set up.

## 2019-08-23 ENCOUNTER — Telehealth: Payer: Self-pay

## 2019-08-23 NOTE — Telephone Encounter (Signed)
Phone call placed to patient. Phone rang with no answer

## 2019-08-28 ENCOUNTER — Telehealth: Payer: Self-pay

## 2019-08-28 NOTE — Telephone Encounter (Signed)
Phone call placed to patient's husband to inquire if he would like to schedule visit with Palliative Care. Husband declined services as patient is being followed by [facility].

## 2019-09-21 ENCOUNTER — Encounter (HOSPITAL_COMMUNITY): Payer: Self-pay | Admitting: Emergency Medicine

## 2019-09-21 ENCOUNTER — Emergency Department (HOSPITAL_COMMUNITY)
Admission: EM | Admit: 2019-09-21 | Discharge: 2019-09-21 | Disposition: A | Discharge status: Home / Self Care | Payer: Medicare HMO | Attending: Emergency Medicine | Admitting: Emergency Medicine

## 2019-09-21 ENCOUNTER — Other Ambulatory Visit: Payer: Self-pay

## 2019-09-21 ENCOUNTER — Emergency Department (HOSPITAL_COMMUNITY): Payer: Medicare HMO

## 2019-09-21 DIAGNOSIS — I2510 Atherosclerotic heart disease of native coronary artery without angina pectoris: Secondary | ICD-10-CM | POA: Diagnosis not present

## 2019-09-21 DIAGNOSIS — W19XXXA Unspecified fall, initial encounter: Secondary | ICD-10-CM | POA: Diagnosis not present

## 2019-09-21 DIAGNOSIS — M545 Low back pain: Secondary | ICD-10-CM | POA: Diagnosis not present

## 2019-09-21 DIAGNOSIS — W1830XA Fall on same level, unspecified, initial encounter: Secondary | ICD-10-CM | POA: Insufficient documentation

## 2019-09-21 DIAGNOSIS — S29012A Strain of muscle and tendon of back wall of thorax, initial encounter: Secondary | ICD-10-CM | POA: Diagnosis not present

## 2019-09-21 DIAGNOSIS — Y92019 Unspecified place in single-family (private) house as the place of occurrence of the external cause: Secondary | ICD-10-CM | POA: Diagnosis not present

## 2019-09-21 DIAGNOSIS — F0390 Unspecified dementia without behavioral disturbance: Secondary | ICD-10-CM | POA: Diagnosis not present

## 2019-09-21 DIAGNOSIS — S7011XA Contusion of right thigh, initial encounter: Secondary | ICD-10-CM | POA: Diagnosis not present

## 2019-09-21 DIAGNOSIS — S39012A Strain of muscle, fascia and tendon of lower back, initial encounter: Secondary | ICD-10-CM | POA: Diagnosis not present

## 2019-09-21 DIAGNOSIS — S0990XA Unspecified injury of head, initial encounter: Secondary | ICD-10-CM | POA: Diagnosis not present

## 2019-09-21 DIAGNOSIS — Y999 Unspecified external cause status: Secondary | ICD-10-CM | POA: Insufficient documentation

## 2019-09-21 DIAGNOSIS — R52 Pain, unspecified: Secondary | ICD-10-CM | POA: Diagnosis not present

## 2019-09-21 DIAGNOSIS — Y939 Activity, unspecified: Secondary | ICD-10-CM | POA: Insufficient documentation

## 2019-09-21 DIAGNOSIS — Z7901 Long term (current) use of anticoagulants: Secondary | ICD-10-CM | POA: Insufficient documentation

## 2019-09-21 DIAGNOSIS — M5489 Other dorsalgia: Secondary | ICD-10-CM | POA: Diagnosis not present

## 2019-09-21 DIAGNOSIS — R69 Illness, unspecified: Secondary | ICD-10-CM | POA: Diagnosis not present

## 2019-09-21 DIAGNOSIS — R0902 Hypoxemia: Secondary | ICD-10-CM | POA: Diagnosis not present

## 2019-09-21 DIAGNOSIS — Z79899 Other long term (current) drug therapy: Secondary | ICD-10-CM | POA: Diagnosis not present

## 2019-09-21 DIAGNOSIS — S7001XA Contusion of right hip, initial encounter: Secondary | ICD-10-CM | POA: Diagnosis not present

## 2019-09-21 DIAGNOSIS — I959 Hypotension, unspecified: Secondary | ICD-10-CM | POA: Diagnosis not present

## 2019-09-21 DIAGNOSIS — M546 Pain in thoracic spine: Secondary | ICD-10-CM | POA: Diagnosis not present

## 2019-09-21 DIAGNOSIS — S299XXA Unspecified injury of thorax, initial encounter: Secondary | ICD-10-CM | POA: Diagnosis not present

## 2019-09-21 DIAGNOSIS — F0391 Unspecified dementia with behavioral disturbance: Secondary | ICD-10-CM

## 2019-09-21 LAB — CBC WITH DIFFERENTIAL/PLATELET
Abs Immature Granulocytes: 0.03 10*3/uL (ref 0.00–0.07)
Basophils Absolute: 0 10*3/uL (ref 0.0–0.1)
Basophils Relative: 0 %
Eosinophils Absolute: 0.1 10*3/uL (ref 0.0–0.5)
Eosinophils Relative: 1 %
HCT: 33.9 % — ABNORMAL LOW (ref 36.0–46.0)
Hemoglobin: 10.6 g/dL — ABNORMAL LOW (ref 12.0–15.0)
Immature Granulocytes: 0 %
Lymphocytes Relative: 32 %
Lymphs Abs: 2.2 10*3/uL (ref 0.7–4.0)
MCH: 28 pg (ref 26.0–34.0)
MCHC: 31.3 g/dL (ref 30.0–36.0)
MCV: 89.7 fL (ref 80.0–100.0)
Monocytes Absolute: 0.9 10*3/uL (ref 0.1–1.0)
Monocytes Relative: 12 %
Neutro Abs: 3.7 10*3/uL (ref 1.7–7.7)
Neutrophils Relative %: 55 %
Platelets: 51 10*3/uL — ABNORMAL LOW (ref 150–400)
RBC: 3.78 MIL/uL — ABNORMAL LOW (ref 3.87–5.11)
RDW: 15.4 % (ref 11.5–15.5)
WBC: 6.9 10*3/uL (ref 4.0–10.5)
nRBC: 0 % (ref 0.0–0.2)

## 2019-09-21 LAB — COMPREHENSIVE METABOLIC PANEL
ALT: 19 U/L (ref 0–44)
AST: 25 U/L (ref 15–41)
Albumin: 3.5 g/dL (ref 3.5–5.0)
Alkaline Phosphatase: 47 U/L (ref 38–126)
Anion gap: 10 (ref 5–15)
BUN: 26 mg/dL — ABNORMAL HIGH (ref 8–23)
CO2: 24 mmol/L (ref 22–32)
Calcium: 9.1 mg/dL (ref 8.9–10.3)
Chloride: 104 mmol/L (ref 98–111)
Creatinine, Ser: 0.94 mg/dL (ref 0.44–1.00)
GFR calc Af Amer: >60 mL/min (ref >60)
GFR calc non Af Amer: 56 mL/min — ABNORMAL LOW (ref >60)
Glucose, Bld: 142 mg/dL — ABNORMAL HIGH (ref 70–99)
Potassium: 4.1 mmol/L (ref 3.5–5.1)
Sodium: 138 mmol/L (ref 135–145)
Total Bilirubin: 0.6 mg/dL (ref 0.3–1.2)
Total Protein: 6.7 g/dL (ref 6.5–8.1)

## 2019-09-21 MED ORDER — FENTANYL CITRATE (PF) 100 MCG/2ML IJ SOLN
12.5000 ug | Freq: Once | INTRAMUSCULAR | Status: AC
  Administered 2019-09-21: 04:00:00 12.5 ug via INTRAVENOUS
  Filled 2019-09-21: qty 2

## 2019-09-21 MED ORDER — QUETIAPINE FUMARATE 50 MG PO TABS: 50.0000 mg | ORAL_TABLET | Freq: Three times a day (TID) | ORAL | 3 refills | Status: DC

## 2019-09-21 MED ORDER — ESCITALOPRAM OXALATE 5 MG PO TABS: 5.0000 mg | ORAL_TABLET | Freq: Every day | ORAL | 3 refills | Status: DC

## 2019-09-21 MED ORDER — TRAZODONE HCL 50 MG PO TABS: 50.0000 mg | ORAL_TABLET | Freq: Every day | ORAL | 3 refills | Status: DC

## 2019-09-21 MED ORDER — QUETIAPINE FUMARATE 50 MG PO TABS
50.0000 mg | ORAL_TABLET | ORAL | Status: DC
  Filled 2019-09-21: qty 1

## 2019-09-21 MED ORDER — FENTANYL CITRATE (PF) 100 MCG/2ML IJ SOLN
12.5000 ug | Freq: Once | INTRAMUSCULAR | Status: AC
  Administered 2019-09-21: 05:00:00 12.5 ug via INTRAVENOUS
  Filled 2019-09-21: qty 2

## 2019-09-21 MED ORDER — LIDOCAINE 5 % EX PTCH: 1.0000 | MEDICATED_PATCH | CUTANEOUS | 0 refills | Status: AC

## 2019-09-21 MED ORDER — ESCITALOPRAM OXALATE 10 MG PO TABS
5.0000 mg | ORAL_TABLET | ORAL | Status: DC
  Filled 2019-09-21: qty 1

## 2019-09-21 NOTE — ED Triage Notes (Signed)
Patient arrived with EMS from home fell 2 days ago , spouse reported patient complaining of low back pain , patient has dementia - poor historian at arrival , respirations unlabored . Alert but disoriented .

## 2019-09-21 NOTE — ED Triage Notes (Signed)
PT refusing  PO meds . Pt belligerent . Pt moans when moving and places hand on her lower back the patient denies any pain. Pt voices she wants to get ou of bed.

## 2019-09-21 NOTE — ED Provider Notes (Signed)
[facility] EMERGENCY DEPARTMENT Provider Note   CSN: [id number] Arrival date & time: 09/21/19  0118     History Chief Complaint  Patient presents with  . Fall/Back Pain    [name] is a 86 y.o. female.  Patient comes to the ER for evaluation of pain after a fall.  Patient reportedly had an unwitnessed fall 2 days ago and has been having back pain since.  Patient has a history of severe dementia, cannot provide any more information. Level V Caveat due to dementia.        Past Medical History:  Diagnosis Date  . Bilateral hearing loss   . CAD (coronary artery disease) 2003   on Plavix.   . CVA (cerebral vascular accident) ([place]) 2010  . Fatigue   . Gross hematuria 07/03/2015  . Hemorrhagic stroke ([place]) 2010  . Hiatal hernia   . Hypercholesteremia   . ITP (idiopathic thrombocytopenic purpura) 01/04/2016  . Kidney stones   . Osteoporosis   . Pancreatitis 1970  . Pancreatitis   . Thrombocytopenia ([place])   . Unspecified deficiency anemia     Patient Active Problem List   Diagnosis Date Noted  . Deficiency anemia 05/20/2019  . Acute anemia 05/07/2019  . Recurrent vertigo 11/02/2018  . Seasonal allergies 07/12/2018  . Hives of unknown origin 03/13/2018  . Financial difficulties 01/16/2018  . Poor memory 11/17/2017  . Bleeding hemorrhoid 09/22/2017  . Port-A-Cath in place 08/25/2017  . Poor venous access 05/05/2017  . Chronic ITP (idiopathic thrombocytopenia) (H[facility]) 05/02/2017  . Hyperlipidemia LDL goal <70 09/29/2016  . Idiopathic thrombocytopenic purpura ([place]) 01/04/2016  . S/P mitral valve repair 04/26/2015  . Hypertrophic obstructive cardiomyopathy ([place]) 04/08/2014  . Intracranial hemorrhage ([place]) 04/08/2014  . Paroxysmal atrial fibrillation ([place]) 04/08/2014  . Thrombocytopenia ([place])   . Other fatigue   . Unspecified deficiency anemia     Past Surgical History:  Procedure Laterality Date  . APPENDECTOMY    . CARDIAC VALVE SURGERY   2010  . CATARACT EXTRACTION  2017   x 2  . COLONOSCOPY     [facility], Dr. Wynetta Emery  . COLONOSCOPY WITH PROPOFOL N/A 04/09/2013   Procedure: COLONOSCOPY WITH PROPOFOL;  Surgeon: Garlan Fair, MD;  Location: [facility];  Service: Endoscopy;  Laterality: N/A;  . CORONARY ARTERY BYPASS GRAFT  06/2009  . FLEXIBLE SIGMOIDOSCOPY N/A 08/03/2015   Procedure: FLEXIBLE SIGMOIDOSCOPY;  Surgeon: Garlan Fair, MD;  Location: [facility];  Service: Endoscopy;  Laterality: N/A;  . IR FLUORO GUIDE PORT INSERTION RIGHT  07/11/2017  . IR US GUIDE VASC ACCESS RIGHT  07/11/2017  . TUBAL LIGATION       OB History   No obstetric history on file.     Family History  Problem Relation Age of Onset  . Cancer Mother 32       colon  . Heart attack Father   . Cancer Sister        colon, cervical  . Stroke Sister   . Memory loss Sister   . Cancer Brother        lung  . Dementia Brother     Social History   Tobacco Use  . Smoking status: Never Smoker  . Smokeless tobacco: Never Used  Substance Use Topics  . Alcohol use: No  . Drug use: No    Home Medications Prior to Admission medications   Medication Sig Start Date End Date Taking? Authorizing Provider  amLODipine (NORVASC) 5  MG tablet Take 1 tablet (5 mg total) by mouth daily. 09/29/16 07/13/19  Belva Crome, MD  EPINEPHrine (ADRENALIN) 30 MG/30ML SOLN injection Inject 1 mg as directed See admin instructions. As directed for emergency    [provider]  escitalopram (LEXAPRO) 5 MG tablet Take 1 tablet (5 mg total) by mouth daily. 09/21/19   Orpah Greek, MD  ezetimibe (ZETIA) 10 MG tablet Take 10 mg by mouth daily.    [provider]  lidocaine (LIDODERM) 5 % Place 1 patch onto the skin daily. Remove & Discard patch within 12 hours or as directed by MD 09/21/19   Orpah Greek, MD  meclizine (ANTIVERT) 25 MG tablet Take 25 mg by mouth 3 (three) times daily as needed for dizziness.    [provider]  omeprazole (PRILOSEC) 40 MG capsule Take 40 mg by mouth daily.    [provider]  QUEtiapine (SEROQUEL) 50 MG tablet Take 1 tablet (50 mg total) by mouth 3 (three) times daily. 09/21/19   Orpah Greek, MD  risperiDONE (RISPERDAL) 0.25 MG tablet Take 0.25 mg by mouth at bedtime. Take 1 tablet (0.25 mg totally) by mouth at bed time; then go to twice daily for 30 days    [provider]  rosuvastatin (CRESTOR) 10 MG tablet Take 10 mg by mouth daily.    [provider]  sotalol (BETAPACE) 160 MG tablet Take 320 mg by mouth 2 (two) times daily.    [provider]  traZODone (DESYREL) 50 MG tablet Take 1 tablet (50 mg total) by mouth at bedtime. 09/21/19   Orpah Greek, MD    Allergies    Bee venom, Iodides, Iodinated diagnostic agents, Lyrica [pregabalin], Codeine, Demerol [meperidine], Ambien [zolpidem tartrate], Aspirin, Celebrex [celecoxib], Darvocet [propoxyphene n-acetaminophen], Fosamax [alendronate sodium], Lipitor [atorvastatin], Morphine and related, Propoxyphene, Valium [diazepam], and Iodine  Review of Systems   Review of Systems  Unable to perform ROS: Dementia    Physical Exam Updated Vital Signs BP (!) 149/64   Pulse (!) 57   Temp 97.6 F (36.4 C) (Oral)   Resp 16   SpO2 97%   Physical Exam HENT:     Head: Normocephalic and atraumatic.  Eyes:     Pupils: Pupils are equal, round, and reactive to light.  Cardiovascular:     Rate and Rhythm: Normal rate and regular rhythm.  Pulmonary:     Effort: Pulmonary effort is normal.     Breath sounds: Normal breath sounds.  Abdominal:     Palpations: Abdomen is soft.     Tenderness: There is no abdominal tenderness.  Musculoskeletal:     Thoracic back: Tenderness present.     Lumbar back: Normal.       Back:  Skin:      Neurological:     Mental Status: She is alert.     ED Results / Procedures / Treatments   Labs (all labs ordered are listed, but  only abnormal results are displayed) Labs Reviewed  CBC WITH DIFFERENTIAL/PLATELET - Abnormal; Notable for the following components:      Result Value   RBC 3.78 (*)    Hemoglobin 10.6 (*)    HCT 33.9 (*)    Platelets 51 (*)    All other components within normal limits  COMPREHENSIVE METABOLIC PANEL - Abnormal; Notable for the following components:   Glucose, Bld 142 (*)    BUN 26 (*)    GFR calc non Af  Amer 56 (*)    All other components within normal limits    EKG None  Radiology DG Ribs Unilateral W/Chest Left  Result Date: 09/21/2019 CLINICAL DATA:  Fall 2 days ago. EXAM: LEFT RIBS AND CHEST - 3+ VIEW COMPARISON:  Chest radiograph 07/13/2019 FINDINGS: No fracture or other bone lesions are seen involving the ribs. There is no evidence of pneumothorax or pleural effusion. Right chest port with tip at the atrial caval junction. Similar scarring in the left mid lung. Similar mild cardiomegaly with coronary stent. Mitral annulus calcifications. IMPRESSION: 1. No evidence of rib fracture or other acute abnormality. 2. Similar scarring in the left mid lung. 3. Stable cardiomegaly. Electronically Signed   By: Keith Rake M.D.   On: 09/21/2019 05:05   DG Thoracic Spine 2 View  Result Date: 09/21/2019 CLINICAL DATA:  Fall 2 days ago. Back pain. EXAM: THORACIC SPINE 2 VIEWS COMPARISON:  None. FINDINGS: Cervicothoracic junction and upper thoracic spine are partially obscured by overlapping osseous and soft tissue density. Slight exaggerated thoracic kyphosis. Vertebral body heights are maintained. No evidence of acute fracture. Mild degenerative disc disease in the midthoracic spine. Posterior elements appear intact. There is no paravertebral soft tissue abnormality. IMPRESSION: 1. No radiographic findings of thoracic spine fracture. The upper thoracic spine is partially obscured by overlapping osseous and soft tissue density. 2. Mild degenerative change in the midthoracic spine. Electronically  Signed   By: Keith Rake M.D.   On: 09/21/2019 05:07   DG Lumbar Spine Complete  Result Date: 09/21/2019 CLINICAL DATA:  Post fall with mid to lower back pain. EXAM: LUMBAR SPINE - COMPLETE 4+ VIEW COMPARISON:  Radiograph 02/27/2017 FINDINGS: The alignment is maintained. Vertebral body heights are normal. There is no listhesis. The posterior elements are intact. Scattered degenerative endplate spurring. Facet hypertrophy in the lower lumbar spine. No fracture. Sacroiliac joints are congruent. Mild bony under mineralization. Aortic atherosclerosis. IMPRESSION: 1. No fracture or subluxation of the lumbar spine. 2. Stable mild degenerative change since 2018. Electronically Signed   By: Keith Rake M.D.   On: 09/21/2019 02:21   CT Head Wo Contrast  Result Date: 09/21/2019 CLINICAL DATA:  Head trauma, minor. Additional history provided: Fall EXAM: CT HEAD WITHOUT CONTRAST TECHNIQUE: Contiguous axial images were obtained from the base of the skull through the vertex without intravenous contrast. COMPARISON:  Brain MRI 03/25/2019 FINDINGS: Brain: No evidence of acute intracranial hemorrhage. No demarcated cortical infarction. No evidence of intracranial mass. No midline shift or extra-axial fluid collection. Again demonstrated are chronic infarcts within the right frontal lobe, right insula, right temporal lobe and left occipital lobe. Stable generalized parenchymal atrophy and background chronic small vessel ischemic disease. Vascular: No hyperdense vessel.  Atherosclerotic calcifications. Skull: Normal. Negative for fracture or focal lesion. Sinuses/Orbits: Partial opacification of right ethmoid air cells. Extensive partial opacification of the right maxillary sinus. Mild mucosal thickening within the left maxillary sinus. Trace fluid within right mastoid air cells. Visualized orbits demonstrate no acute abnormality. IMPRESSION: No evidence of acute intracranial abnormality. Stable multifocal remote  infarcts as described. Generalized parenchymal atrophy and chronic small vessel ischemic disease. Paranasal sinus disease as described and similar to prior examination 08/08/2019. Electronically Signed   By: Kellie Simmering DO   On: 09/21/2019 07:11   DG Hip Unilat W or Wo Pelvis 2-3 Views Right  Result Date: 09/21/2019 CLINICAL DATA:  Fall 2 days ago. Right hip bruising. EXAM: DG HIP (WITH OR WITHOUT PELVIS) 2-3V RIGHT COMPARISON:  None.  FINDINGS: The cortical margins of the bony pelvis and right are intact. No fracture. Pubic symphysis and sacroiliac joints are congruent. Both femoral heads are well-seated in the respective acetabula. Mild lateral soft tissue edema. IMPRESSION: No fracture of the pelvis or right hip. Electronically Signed   By: Keith Rake M.D.   On: 09/21/2019 05:08    Procedures Procedures (including critical care time)  Medications Ordered in ED Medications  fentaNYL (SUBLIMAZE) injection 12.5 mcg (12.5 mcg Intravenous Given 09/21/19 0341)  fentaNYL (SUBLIMAZE) injection 12.5 mcg (12.5 mcg Intravenous Given 09/21/19 0440)    ED Course  I have reviewed the triage vital signs and the nursing notes.  Pertinent labs & imaging results that were available during my care of the patient were reviewed by me and considered in my medical decision making (see chart for details).    MDM Rules/Calculators/A&P                      Patient presents to the emergency department for evaluation of back pain.  Patient fell 2 days ago.  Husband reports that she has been complaining of the pain since the fall.  He has been using a Lidoderm patch on her but overnight she started to have increased complaints of pain.  At arrival, patient who is very agitated.  She complained of pain in the mid thoracic and left posterior rib area pain when palpated.  She also had some bruising of the posterior right hip.  She underwent x-ray of chest, left ribs, thoracic spine, lumbar spine, right hip and pelvis.   No fractures were noted.  CT head with chronic changes, no acute injury or other abnormality.  Patient has significant allergies to analgesics.  Mostly she has adverse responses with hallucinations and behavioral disturbance.  She was given small doses of fentanyl and seemed to tolerate it.  Pain has improved but patient is still exhibiting some agitation from her dementia.  I discussed disposition with the patient's husband who is her primary caregiver.  He does not wish to pursue any [facility] psych treatment.  Additionally he does not wish to pursue skilled nursing placement.  He would like her to come home.  When I discussed pain management, he confirms that she does not do well with any opiates.  We will therefore continue Lidoderm patches, OTC pain meds like Tylenol.  He does confirm that she has run out of some of her medications which may have been affecting her behavior.  She has run out of her trazodone, Lexapro and Seroquel.  I have refilled these prescriptions.  Husband will come to pick her up. Final Clinical Impression(s) / ED Diagnoses Final diagnoses:  Back strain, initial encounter  Dementia with behavioral disturbance, unspecified dementia type ([place])    Rx / DC Orders ED Discharge Orders         Ordered    traZODone (DESYREL) 50 MG tablet  Daily at bedtime     09/21/19 0737    escitalopram (LEXAPRO) 5 MG tablet  Daily     09/21/19 0737    QUEtiapine (SEROQUEL) 50 MG tablet  3 times daily     09/21/19 0737    lidocaine (LIDODERM) 5 %  Every 24 hours     09/21/19 0739           Orpah Greek, MD 09/21/19 [phone]

## 2019-09-22 NOTE — Progress Notes (Deleted)
Cardiology Office Note:    Date:  09/22/2019   ID:  [name], DOB [birth year], MRN [id number]  PCP:  [name], MD  Cardiologist:  Sinclair Grooms, MD   Referring MD: [name], MD   No chief complaint on file.   History of Present Illness:    [name] is a 86 y.o. female with a hx ofHOCM, septal myectomy 2010, CABG 2010, intracranial hemorrhage with resultant memory loss, MV repair 2010, h/o PAF and "no anticoagulation due to prior intracranial hemorrhage".   ***  Past Medical History:  Diagnosis Date  . Bilateral hearing loss   . CAD (coronary artery disease) 2003   on Plavix.   . CVA (cerebral vascular accident) ([place]) 2010  . Fatigue   . Gross hematuria 07/03/2015  . Hemorrhagic stroke ([place]) 2010  . Hiatal hernia   . Hypercholesteremia   . ITP (idiopathic thrombocytopenic purpura) 01/04/2016  . Kidney stones   . Osteoporosis   . Pancreatitis 1970  . Pancreatitis   . Thrombocytopenia ([place])   . Unspecified deficiency anemia     Past Surgical History:  Procedure Laterality Date  . APPENDECTOMY    . CARDIAC VALVE SURGERY  2010  . CATARACT EXTRACTION  2017   x 2  . COLONOSCOPY     [facility], Dr. Wynetta Emery  . COLONOSCOPY WITH PROPOFOL N/A 04/09/2013   Procedure: COLONOSCOPY WITH PROPOFOL;  Surgeon: Garlan Fair, MD;  Location: [facility];  Service: Endoscopy;  Laterality: N/A;  . CORONARY ARTERY BYPASS GRAFT  06/2009  . FLEXIBLE SIGMOIDOSCOPY N/A 08/03/2015   Procedure: FLEXIBLE SIGMOIDOSCOPY;  Surgeon: Garlan Fair, MD;  Location: [facility];  Service: Endoscopy;  Laterality: N/A;  . IR FLUORO GUIDE PORT INSERTION RIGHT  07/11/2017  . IR US GUIDE VASC ACCESS RIGHT  07/11/2017  . TUBAL LIGATION      Current Medications: No outpatient medications have been marked as taking for the 09/23/19 encounter (Appointment) with Belva Crome, MD.     Allergies:   Bee venom, Iodides, Iodinated diagnostic agents, Lyrica [pregabalin],  Codeine, Demerol [meperidine], Ambien [zolpidem tartrate], Aspirin, Celebrex [celecoxib], Darvocet [propoxyphene n-acetaminophen], Fosamax [alendronate sodium], Lipitor [atorvastatin], Morphine and related, Propoxyphene, Valium [diazepam], and Iodine   Social History   Socioeconomic History  . Marital status: Married    Spouse name: Ed  . Number of children: 2  . Years of education: College  . Highest education level: Not on file  Occupational History    Comment: retired Network engineer [employer]  Tobacco Use  . Smoking status: Never Smoker  . Smokeless tobacco: Never Used  Substance and Sexual Activity  . Alcohol use: No  . Drug use: No  . Sexual activity: Not on file  Other Topics Concern  . Not on file  Social History Narrative   Pt lives at home with spouse.   Caffeine Use: Rarely   Social Determinants of Health   Financial Resource Strain:   . Difficulty of Paying Living Expenses: Not on file  Food Insecurity:   . Worried About Charity fundraiser in the Last Year: Not on file  . Ran Out of Food in the Last Year: Not on file  Transportation Needs:   . Lack of Transportation (Medical): Not on file  . Lack of Transportation (Non-Medical): Not on file  Physical Activity:   . Days of Exercise per Week: Not on file  . Minutes of Exercise per Session: Not on file  Stress:   .  Feeling of Stress : Not on file  Social Connections:   . Frequency of Communication with Friends and Family: Not on file  . Frequency of Social Gatherings with Friends and Family: Not on file  . Attends Religious Services: Not on file  . Active Member of Clubs or Organizations: Not on file  . Attends Archivist Meetings: Not on file  . Marital Status: Not on file     Family History: The patient's family history includes Cancer in her brother and sister; Cancer (age of onset: 18) in her mother; Dementia in her brother; Heart attack in her father; Memory loss in her sister; Stroke in her sister.   ROS:   Please see the history of present illness.    *** All other systems reviewed and are negative.  EKGs/Labs/Other Studies Reviewed:    The following studies were reviewed today: LAST ECHO 2016  ***  EKG:  EKG ***  Recent Labs: 09/21/2019: ALT 19; BUN 26; Creatinine, Ser 0.94; Hemoglobin 10.6; Platelets 51; Potassium 4.1; Sodium 138  Recent Lipid Panel    Component Value Date/Time   CHOL 125 09/29/2016 0915   TRIG 143 09/29/2016 0915   HDL 46 09/29/2016 0915   CHOLHDL 2.7 09/29/2016 0915   CHOLHDL 3.9 07/22/2009 0945   VLDL 16 07/22/2009 0945   LDLCALC 50 09/29/2016 0915    Physical Exam:    VS:  There were no vitals taken for this visit.    Wt Readings from Last 3 Encounters:  07/12/19 130 lb (59 kg)  06/17/19 143 lb 12.8 oz (65.2 kg)  05/07/19 138 lb 9.6 oz (62.9 kg)     GEN: ***. No acute distress HEENT: Normal NECK: No JVD. LYMPHATICS: No lymphadenopathy CARDIAC: *** RRR without murmur, gallop, or edema. VASCULAR: *** Normal Pulses. No bruits. RESPIRATORY:  Clear to auscultation without rales, wheezing or rhonchi  ABDOMEN: Soft, non-tender, non-distended, No pulsatile mass, MUSCULOSKELETAL: No deformity  SKIN: Warm and dry NEUROLOGIC:  Alert and oriented x 3 PSYCHIATRIC:  Normal affect   ASSESSMENT:    1. Hypertrophic obstructive cardiomyopathy (HCC)   2. Paroxysmal atrial fibrillation ([place])   3. S/P mitral valve repair   4. Essential hypertension   5. Mixed hyperlipidemia   6. Educated about COVID-19 virus infection    PLAN:    In order of problems listed above:  1. ***   Medication Adjustments/Labs and Tests Ordered: Current medicines are reviewed at length with the patient today.  Concerns regarding medicines are outlined above.  No orders of the defined types were placed in this encounter.  No orders of the defined types were placed in this encounter.   There are no Patient Instructions on file for this visit.   Signed, Sinclair Grooms, MD  09/22/2019 6:17 PM    [place]

## 2019-09-23 ENCOUNTER — Ambulatory Visit: Payer: Medicare HMO | Admitting: Interventional Cardiology

## 2019-10-30 ENCOUNTER — Other Ambulatory Visit: Payer: Self-pay | Admitting: Gastroenterology

## 2019-10-30 DIAGNOSIS — Z86010 Personal history of colonic polyps: Secondary | ICD-10-CM

## 2019-10-30 DIAGNOSIS — I69359 Hemiplegia and hemiparesis following cerebral infarction affecting unspecified side: Secondary | ICD-10-CM

## 2019-11-14 DIAGNOSIS — R69 Illness, unspecified: Secondary | ICD-10-CM | POA: Diagnosis not present

## 2019-11-14 DIAGNOSIS — E782 Mixed hyperlipidemia: Secondary | ICD-10-CM | POA: Diagnosis not present

## 2019-11-14 DIAGNOSIS — D693 Immune thrombocytopenic purpura: Secondary | ICD-10-CM | POA: Diagnosis not present

## 2019-11-14 DIAGNOSIS — I4891 Unspecified atrial fibrillation: Secondary | ICD-10-CM | POA: Diagnosis not present

## 2019-11-14 DIAGNOSIS — L659 Nonscarring hair loss, unspecified: Secondary | ICD-10-CM | POA: Diagnosis not present

## 2019-11-14 DIAGNOSIS — I10 Essential (primary) hypertension: Secondary | ICD-10-CM | POA: Diagnosis not present

## 2019-12-17 ENCOUNTER — Inpatient Hospital Stay (HOSPITAL_COMMUNITY)
Admission: EM | Admit: 2019-12-17 | Discharge: 2019-12-21 | DRG: 101 | Disposition: A | Discharge status: Hospice | Payer: Medicare HMO | Attending: Internal Medicine | Admitting: Internal Medicine

## 2019-12-17 ENCOUNTER — Emergency Department (HOSPITAL_COMMUNITY): Payer: Medicare HMO

## 2019-12-17 DIAGNOSIS — D693 Immune thrombocytopenic purpura: Secondary | ICD-10-CM | POA: Diagnosis present

## 2019-12-17 DIAGNOSIS — M47812 Spondylosis without myelopathy or radiculopathy, cervical region: Secondary | ICD-10-CM | POA: Diagnosis present

## 2019-12-17 DIAGNOSIS — R739 Hyperglycemia, unspecified: Secondary | ICD-10-CM | POA: Diagnosis not present

## 2019-12-17 DIAGNOSIS — M810 Age-related osteoporosis without current pathological fracture: Secondary | ICD-10-CM | POA: Diagnosis present

## 2019-12-17 DIAGNOSIS — Z885 Allergy status to narcotic agent status: Secondary | ICD-10-CM | POA: Diagnosis not present

## 2019-12-17 DIAGNOSIS — I447 Left bundle-branch block, unspecified: Secondary | ICD-10-CM | POA: Diagnosis present

## 2019-12-17 DIAGNOSIS — I421 Obstructive hypertrophic cardiomyopathy: Secondary | ICD-10-CM | POA: Diagnosis present

## 2019-12-17 DIAGNOSIS — R0989 Other specified symptoms and signs involving the circulatory and respiratory systems: Secondary | ICD-10-CM | POA: Diagnosis not present

## 2019-12-17 DIAGNOSIS — R410 Disorientation, unspecified: Secondary | ICD-10-CM | POA: Diagnosis not present

## 2019-12-17 DIAGNOSIS — R69 Illness, unspecified: Secondary | ICD-10-CM | POA: Diagnosis not present

## 2019-12-17 DIAGNOSIS — H9193 Unspecified hearing loss, bilateral: Secondary | ICD-10-CM | POA: Diagnosis present

## 2019-12-17 DIAGNOSIS — G9340 Encephalopathy, unspecified: Secondary | ICD-10-CM

## 2019-12-17 DIAGNOSIS — D696 Thrombocytopenia, unspecified: Secondary | ICD-10-CM | POA: Diagnosis present

## 2019-12-17 DIAGNOSIS — F0391 Unspecified dementia with behavioral disturbance: Secondary | ICD-10-CM | POA: Diagnosis present

## 2019-12-17 DIAGNOSIS — I480 Paroxysmal atrial fibrillation: Secondary | ICD-10-CM | POA: Diagnosis present

## 2019-12-17 DIAGNOSIS — Z888 Allergy status to other drugs, medicaments and biological substances status: Secondary | ICD-10-CM

## 2019-12-17 DIAGNOSIS — Z823 Family history of stroke: Secondary | ICD-10-CM

## 2019-12-17 DIAGNOSIS — S0990XA Unspecified injury of head, initial encounter: Secondary | ICD-10-CM | POA: Diagnosis not present

## 2019-12-17 DIAGNOSIS — Z8249 Family history of ischemic heart disease and other diseases of the circulatory system: Secondary | ICD-10-CM | POA: Diagnosis not present

## 2019-12-17 DIAGNOSIS — E876 Hypokalemia: Secondary | ICD-10-CM | POA: Diagnosis not present

## 2019-12-17 DIAGNOSIS — F0151 Vascular dementia with behavioral disturbance: Secondary | ICD-10-CM | POA: Diagnosis not present

## 2019-12-17 DIAGNOSIS — R4020 Unspecified coma: Secondary | ICD-10-CM | POA: Diagnosis not present

## 2019-12-17 DIAGNOSIS — F03918 Unspecified dementia, unspecified severity, with other behavioral disturbance: Secondary | ICD-10-CM

## 2019-12-17 DIAGNOSIS — F329 Major depressive disorder, single episode, unspecified: Secondary | ICD-10-CM | POA: Diagnosis present

## 2019-12-17 DIAGNOSIS — E785 Hyperlipidemia, unspecified: Secondary | ICD-10-CM | POA: Diagnosis present

## 2019-12-17 DIAGNOSIS — S199XXA Unspecified injury of neck, initial encounter: Secondary | ICD-10-CM | POA: Diagnosis not present

## 2019-12-17 DIAGNOSIS — R627 Adult failure to thrive: Secondary | ICD-10-CM | POA: Diagnosis present

## 2019-12-17 DIAGNOSIS — Z800 Family history of malignant neoplasm of digestive organs: Secondary | ICD-10-CM | POA: Diagnosis not present

## 2019-12-17 DIAGNOSIS — Z66 Do not resuscitate: Secondary | ICD-10-CM | POA: Diagnosis not present

## 2019-12-17 DIAGNOSIS — E871 Hypo-osmolality and hyponatremia: Secondary | ICD-10-CM

## 2019-12-17 DIAGNOSIS — Z7189 Other specified counseling: Secondary | ICD-10-CM

## 2019-12-17 DIAGNOSIS — I10 Essential (primary) hypertension: Secondary | ICD-10-CM | POA: Diagnosis present

## 2019-12-17 DIAGNOSIS — Z801 Family history of malignant neoplasm of trachea, bronchus and lung: Secondary | ICD-10-CM

## 2019-12-17 DIAGNOSIS — G40909 Epilepsy, unspecified, not intractable, without status epilepticus: Secondary | ICD-10-CM | POA: Diagnosis not present

## 2019-12-17 DIAGNOSIS — Z886 Allergy status to analgesic agent status: Secondary | ICD-10-CM

## 2019-12-17 DIAGNOSIS — E8809 Other disorders of plasma-protein metabolism, not elsewhere classified: Secondary | ICD-10-CM | POA: Diagnosis present

## 2019-12-17 DIAGNOSIS — Z20822 Contact with and (suspected) exposure to covid-19: Secondary | ICD-10-CM | POA: Diagnosis not present

## 2019-12-17 DIAGNOSIS — Z03818 Encounter for observation for suspected exposure to other biological agents ruled out: Secondary | ICD-10-CM | POA: Diagnosis not present

## 2019-12-17 DIAGNOSIS — Z8673 Personal history of transient ischemic attack (TIA), and cerebral infarction without residual deficits: Secondary | ICD-10-CM

## 2019-12-17 DIAGNOSIS — R55 Syncope and collapse: Secondary | ICD-10-CM | POA: Diagnosis present

## 2019-12-17 DIAGNOSIS — I2510 Atherosclerotic heart disease of native coronary artery without angina pectoris: Secondary | ICD-10-CM

## 2019-12-17 DIAGNOSIS — E7800 Pure hypercholesterolemia, unspecified: Secondary | ICD-10-CM | POA: Diagnosis present

## 2019-12-17 DIAGNOSIS — I959 Hypotension, unspecified: Secondary | ICD-10-CM | POA: Diagnosis present

## 2019-12-17 DIAGNOSIS — R001 Bradycardia, unspecified: Secondary | ICD-10-CM | POA: Diagnosis not present

## 2019-12-17 DIAGNOSIS — S299XXA Unspecified injury of thorax, initial encounter: Secondary | ICD-10-CM | POA: Diagnosis not present

## 2019-12-17 DIAGNOSIS — G40009 Localization-related (focal) (partial) idiopathic epilepsy and epileptic syndromes with seizures of localized onset, not intractable, without status epilepticus: Secondary | ICD-10-CM | POA: Diagnosis not present

## 2019-12-17 DIAGNOSIS — R413 Other amnesia: Secondary | ICD-10-CM | POA: Diagnosis present

## 2019-12-17 DIAGNOSIS — R404 Transient alteration of awareness: Secondary | ICD-10-CM | POA: Diagnosis not present

## 2019-12-17 DIAGNOSIS — Z951 Presence of aortocoronary bypass graft: Secondary | ICD-10-CM | POA: Diagnosis not present

## 2019-12-17 DIAGNOSIS — Z515 Encounter for palliative care: Secondary | ICD-10-CM

## 2019-12-17 DIAGNOSIS — Z808 Family history of malignant neoplasm of other organs or systems: Secondary | ICD-10-CM

## 2019-12-17 DIAGNOSIS — R52 Pain, unspecified: Secondary | ICD-10-CM | POA: Diagnosis not present

## 2019-12-17 DIAGNOSIS — Z95828 Presence of other vascular implants and grafts: Secondary | ICD-10-CM | POA: Diagnosis not present

## 2019-12-17 DIAGNOSIS — R4182 Altered mental status, unspecified: Secondary | ICD-10-CM

## 2019-12-17 DIAGNOSIS — R4589 Other symptoms and signs involving emotional state: Secondary | ICD-10-CM

## 2019-12-17 DIAGNOSIS — Z91041 Radiographic dye allergy status: Secondary | ICD-10-CM

## 2019-12-17 DIAGNOSIS — Z91030 Bee allergy status: Secondary | ICD-10-CM

## 2019-12-17 DIAGNOSIS — W19XXXA Unspecified fall, initial encounter: Secondary | ICD-10-CM | POA: Diagnosis not present

## 2019-12-17 DIAGNOSIS — R569 Unspecified convulsions: Secondary | ICD-10-CM | POA: Diagnosis not present

## 2019-12-17 LAB — COMPREHENSIVE METABOLIC PANEL
ALT: 23 U/L (ref 0–44)
AST: 27 U/L (ref 15–41)
Albumin: 3.3 g/dL — ABNORMAL LOW (ref 3.5–5.0)
Alkaline Phosphatase: 50 U/L (ref 38–126)
Anion gap: 10 (ref 5–15)
BUN: 26 mg/dL — ABNORMAL HIGH (ref 8–23)
CO2: 22 mmol/L (ref 22–32)
Calcium: 8.6 mg/dL — ABNORMAL LOW (ref 8.9–10.3)
Chloride: 100 mmol/L (ref 98–111)
Creatinine, Ser: 0.85 mg/dL (ref 0.44–1.00)
GFR calc Af Amer: >60 mL/min (ref >60)
GFR calc non Af Amer: >60 mL/min (ref >60)
Glucose, Bld: 158 mg/dL — ABNORMAL HIGH (ref 70–99)
Potassium: 4.6 mmol/L (ref 3.5–5.1)
Sodium: 132 mmol/L — ABNORMAL LOW (ref 135–145)
Total Bilirubin: 0.6 mg/dL (ref 0.3–1.2)
Total Protein: 6.4 g/dL — ABNORMAL LOW (ref 6.5–8.1)

## 2019-12-17 LAB — VALPROIC ACID LEVEL: Valproic Acid Lvl: 37 ug/mL — ABNORMAL LOW (ref 50.0–100.0)

## 2019-12-17 LAB — ACETAMINOPHEN LEVEL: Acetaminophen (Tylenol), Serum: <10 ug/mL — ABNORMAL LOW (ref 10–30)

## 2019-12-17 LAB — CBC WITH DIFFERENTIAL/PLATELET
Abs Immature Granulocytes: 0.03 10*3/uL (ref 0.00–0.07)
Basophils Absolute: 0 10*3/uL (ref 0.0–0.1)
Basophils Relative: 0 %
Eosinophils Absolute: 0.1 10*3/uL (ref 0.0–0.5)
Eosinophils Relative: 2 %
HCT: 31.4 % — ABNORMAL LOW (ref 36.0–46.0)
Hemoglobin: 10.2 g/dL — ABNORMAL LOW (ref 12.0–15.0)
Immature Granulocytes: 0 %
Lymphocytes Relative: 12 %
Lymphs Abs: 0.8 10*3/uL (ref 0.7–4.0)
MCH: 29.1 pg (ref 26.0–34.0)
MCHC: 32.5 g/dL (ref 30.0–36.0)
MCV: 89.5 fL (ref 80.0–100.0)
Monocytes Absolute: 0.9 10*3/uL (ref 0.1–1.0)
Monocytes Relative: 14 %
Neutro Abs: 4.9 10*3/uL (ref 1.7–7.7)
Neutrophils Relative %: 72 %
Platelets: 53 10*3/uL — ABNORMAL LOW (ref 150–400)
RBC: 3.51 MIL/uL — ABNORMAL LOW (ref 3.87–5.11)
RDW: 14 % (ref 11.5–15.5)
WBC: 6.8 10*3/uL (ref 4.0–10.5)
nRBC: 0 % (ref 0.0–0.2)

## 2019-12-17 LAB — URINALYSIS, ROUTINE W REFLEX MICROSCOPIC
Bilirubin Urine: NEGATIVE
Glucose, UA: NEGATIVE mg/dL
Hgb urine dipstick: NEGATIVE
Ketones, ur: NEGATIVE mg/dL
Leukocytes,Ua: NEGATIVE
Nitrite: NEGATIVE
Protein, ur: NEGATIVE mg/dL
Specific Gravity, Urine: 1.009 (ref 1.005–1.030)
pH: 6 (ref 5.0–8.0)

## 2019-12-17 LAB — ETHANOL: Alcohol, Ethyl (B): <10 mg/dL (ref <10)

## 2019-12-17 LAB — TROPONIN I (HIGH SENSITIVITY): Troponin I (High Sensitivity): 8 ng/L (ref <18)

## 2019-12-17 LAB — LACTIC ACID, PLASMA: Lactic Acid, Venous: 1.7 mmol/L (ref 0.5–1.9)

## 2019-12-17 LAB — TSH: TSH: 1.687 u[IU]/mL (ref 0.350–4.500)

## 2019-12-17 LAB — CBG MONITORING, ED: Glucose-Capillary: 148 mg/dL — ABNORMAL HIGH (ref 70–99)

## 2019-12-17 LAB — SALICYLATE LEVEL: Salicylate Lvl: <7 mg/dL — ABNORMAL LOW (ref 7.0–30.0)

## 2019-12-17 LAB — CK: Total CK: 296 U/L — ABNORMAL HIGH (ref 38–234)

## 2019-12-17 MED ORDER — LEVETIRACETAM 500 MG PO TABS: 500.0000 mg | ORAL_TABLET | Freq: Two times a day (BID) | ORAL | Status: DC

## 2019-12-17 MED ORDER — SODIUM CHLORIDE 0.9 % IV BOLUS
500.0000 mL | Freq: Once | INTRAVENOUS | Status: AC
  Administered 2019-12-17: 500 mL via INTRAVENOUS

## 2019-12-17 MED ORDER — DIVALPROEX SODIUM 125 MG PO CSDR: 125.0000 mg | DELAYED_RELEASE_CAPSULE | Freq: Two times a day (BID) | ORAL | Status: DC

## 2019-12-17 MED ORDER — DIVALPROEX SODIUM 125 MG PO CSDR
500.0000 mg | DELAYED_RELEASE_CAPSULE | Freq: Two times a day (BID) | ORAL | Status: DC
  Filled 2019-12-17 (×2): qty 4

## 2019-12-17 MED ORDER — LEVETIRACETAM IN NACL 1000 MG/100ML IV SOLN
1000.0000 mg | Freq: Once | INTRAVENOUS | Status: AC
  Administered 2019-12-17: 1000 mg via INTRAVENOUS
  Filled 2019-12-17: qty 100

## 2019-12-17 NOTE — H&P (Signed)
[facility] History and Physical  [name] [id number] DOB: 1932/09/22 DOA: 12/17/2019  Referring EDP: Rex Kras, MD PCP: [name], MD   Chief Complaint: Collapse, Altered Mental Status   HPI: [name] is a 86 y.o. female with PMH of dementia with behavioral disturbances, ITP, CAD, hemorrhagic stroke who presented to ED by EMS after she had a witnessed collapse at home and unresponsive since that time.   Patient unable to provide history; obtained from chart review and EDP. Patient was brought to ER by EMS after her husband witnessed her collapse around 1430. He was unsure if she hit her head. She remained unresponsive since that time and he finally called EMS. Husband noted that patient with Divalproex bottle with lid off and unsure if she took any. No other history could be obtained.   In the ED: Vitals initially stable but patient became bradycardic and hypotensive. Improved with fluid boluses. Requested CCM consult and they recommended Stepdown unit as patient fluid responsive. Labs remarkable for: TSH/Ethanol Level/Tylenol level/Salicylate level/Lactate/Troponin WNL. UA without evidence of infection. Valproic acid level 37, WBC 6.8, Plt 53, CK 296, Na 132 otherwise CMP WNL. CXR, CT Head/C-Spine without acute findings. Neuro consulted and performed EEG and gave Keppra; Suspect partial seizure.  Review of Systems:  Unable to obtain ROS from patient as she does not respond to questions or communicate at this time. No family at bedside.   Past Medical History:  Diagnosis Date  . Bilateral hearing loss   . CAD (coronary artery disease) 2003   on Plavix.   . CVA (cerebral vascular accident) ([place]) 2010  . Fatigue   . Gross hematuria 07/03/2015  . Hemorrhagic stroke ([place]) 2010  . Hiatal hernia   . Hypercholesteremia   . ITP (idiopathic thrombocytopenic purpura) 01/04/2016  . Kidney stones   . Osteoporosis   . Pancreatitis 1970  . Pancreatitis   .  Thrombocytopenia ([place])   . Unspecified deficiency anemia    Past Surgical History:  Procedure Laterality Date  . APPENDECTOMY    . CARDIAC VALVE SURGERY  2010  . CATARACT EXTRACTION  2017   x 2  . COLONOSCOPY     [facility], Dr. Wynetta Emery  . COLONOSCOPY WITH PROPOFOL N/A 04/09/2013   Procedure: COLONOSCOPY WITH PROPOFOL;  Surgeon: Garlan , MD;  Location: [facility];  Service: Endoscopy;  Laterality: N/A;  . CORONARY ARTERY BYPASS GRAFT  06/2009  . FLEXIBLE SIGMOIDOSCOPY N/A 08/03/2015   Procedure: FLEXIBLE SIGMOIDOSCOPY;  Surgeon: Garlan , MD;  Location: [facility];  Service: Endoscopy;  Laterality: N/A;  . IR FLUORO GUIDE PORT INSERTION RIGHT  07/11/2017  . IR US GUIDE VASC ACCESS RIGHT  07/11/2017  . TUBAL LIGATION     Social History:  reports that she has never smoked. She has never used smokeless tobacco. She reports that she does not drink alcohol or use drugs.  Allergies  Allergen Reactions  . Bee Venom Anaphylaxis  . Iodides Anaphylaxis  . Iodinated Diagnostic Agents Anaphylaxis  . Lyrica [Pregabalin]   . Codeine Other (See Comments)    Reaction: hallucinate  . Demerol [Meperidine] Hives and Other (See Comments)    REACTION: Welts  . Ambien [Zolpidem Tartrate] Other (See Comments)    Altered Mental Status  . Aspirin Other (See Comments)    Bleeding, aneurysm- when she was on plavix Bleeding, aneurysm- when she was on plavix  . Celebrex [Celecoxib] Other (See Comments)    Legs hurt  . Darvocet [Propoxyphene  N-Acetaminophen] Other (See Comments)    Hallucinates  . Fosamax [Alendronate Sodium] Other (See Comments)    Legs hurt  . Lipitor [Atorvastatin] Other (See Comments)    Legs hurt  . Morphine And Related Other (See Comments)    Hallucinations  . Propoxyphene Other (See Comments)    Hallucinates  . Valium [Diazepam] Other (See Comments)    Altered Mental Status  . Iodine Hives    Welts    Family History  Problem Relation Age of Onset    . Cancer Mother 64       colon  . Heart attack Father   . Cancer Sister        colon, cervical  . Stroke Sister   . Memory loss Sister   . Cancer Brother        lung  . Dementia Brother     Prior to Admission medications   Medication Sig Start Date End Date Taking? Authorizing Provider  amLODipine (NORVASC) 5 MG tablet Take 1 tablet (5 mg total) by mouth daily. 09/29/16 12/17/19 Yes Belva Crome, MD  calcium-vitamin D (OSCAL WITH D) 500-200 MG-UNIT TABS tablet Take 1 tablet by mouth daily. 03/02/09  Yes [provider]  Cholecalciferol 25 MCG (1000 UT) capsule Take 1 capsule by mouth daily.  03/02/09  Yes [provider]  diltiazem (TIAZAC) 120 MG 24 hr capsule Take 120 mg by mouth daily. 07/23/09  Yes [provider]  divalproex (DEPAKOTE SPRINKLE) 125 MG capsule Take 125 mg by mouth in the morning and at bedtime. 07/31/19  Yes [provider]  EPINEPHrine (ADRENALIN) 30 MG/30ML SOLN injection Inject 1 mg as directed See admin instructions. As directed for emergency   Yes [provider]  escitalopram (LEXAPRO) 5 MG tablet Take 1 tablet (5 mg total) by mouth daily. 09/21/19  Yes Pollina, Gwenyth Allegra, MD  ezetimibe (ZETIA) 10 MG tablet Take 10 mg by mouth daily.   Yes [provider]  L-Lysine 500 MG TABS Take 500 mg by mouth daily as needed.  03/02/09  Yes [provider]  lidocaine (LIDODERM) 5 % Place 1 patch onto the skin daily. Remove & Discard patch within 12 hours or as directed by MD 09/21/19  Yes Pollina, Gwenyth Allegra, MD  meclizine (ANTIVERT) 25 MG tablet Take 25 mg by mouth 3 (three) times daily as needed for dizziness.   Yes [provider]  Omega-3 Fatty Acids (FISH OIL) 1000 MG CAPS Take 1,000 mg by mouth in the morning.  03/02/09  Yes [provider]  omeprazole (PRILOSEC) 40 MG capsule Take 40 mg by mouth daily.   Yes [provider]  QUEtiapine (SEROQUEL) 50 MG tablet Take 1 tablet (50 mg  total) by mouth 3 (three) times daily. 09/21/19  Yes Pollina, Gwenyth Allegra, MD  rosuvastatin (CRESTOR) 10 MG tablet Take 10 mg by mouth daily.   Yes [provider]  sotalol (BETAPACE) 160 MG tablet Take 320 mg by mouth 2 (two) times daily.   Yes [provider]  traZODone (DESYREL) 50 MG tablet Take 1 tablet (50 mg total) by mouth at bedtime. 09/21/19  Yes Orpah Greek, MD   Physical Exam: Vitals:   12/17/19 2300 12/17/19 2330 12/17/19 2340 12/18/19 0032  BP: (!) 114/48 (!) 89/43 (!) 81/41   Pulse: (!) 50 (!) 49 (!) 47   Resp: 12 13 13    Temp:    (!) 96.2 F (35.7 C)  TempSrc:    Rectal  SpO2: 100% 95% 100%     Wt Readings from Last 3 Encounters:  07/12/19 59 kg  06/17/19 65.2 kg  05/07/19 62.9 kg    . General:  Resting in bed. Responsive to touch but otherwise non-communicative. Non-verbal at the time of my exam.  . Eyes: Eyes closed and not responsive to voice . ENT: unable to assess, mask in place . Neck: normal ROM . Cardiovascular: Bradycardic with regular rhythm, no m/r/g. No LE edema. Port-a-cath present on right upper chest.  . Respiratory: CTA bilaterally, no w/r/r. Normal respiratory effort. . Abdomen: soft, ntnd . Skin: no rash or induration seen on limited exam . Musculoskeletal: Increased tone of upper and lower extremities . Psychiatric: grossly normal mood and affect, speech fluent and appropriate . Neurologic: Does not follow commands. Some jerking movements when touched. Eyes closed.           Labs on Admission:  Basic Metabolic Panel: Recent Labs  Lab 12/17/19 1655  NA 132*  K 4.6  CL 100  CO2 22  GLUCOSE 158*  BUN 26*  CREATININE 0.85  CALCIUM 8.6*   Liver Function Tests: Recent Labs  Lab 12/17/19 1655  AST 27  ALT 23  ALKPHOS 50  BILITOT 0.6  PROT 6.4*  ALBUMIN 3.3*   No results for input(s): LIPASE, AMYLASE in the last 168 hours. No results for input(s): AMMONIA in the last 168 hours. CBC: Recent Labs    Lab 12/17/19 1655  WBC 6.8  NEUTROABS 4.9  HGB 10.2*  HCT 31.4*  MCV 89.5  PLT 53*   Cardiac Enzymes: Recent Labs  Lab 12/17/19 1655  CKTOTAL 296*    BNP (last 3 results) No results for input(s): BNP in the last 8760 hours.  ProBNP (last 3 results) No results for input(s): PROBNP in the last 8760 hours.  CBG: Recent Labs  Lab 12/17/19 1642  GLUCAP 148*    Radiological Exams on Admission: CT Head Wo Contrast  Result Date: 12/17/2019 CLINICAL DATA:  [place], unresponsive, altered level of consciousness EXAM: CT HEAD WITHOUT CONTRAST TECHNIQUE: Contiguous axial images were obtained from the base of the skull through the vertex without intravenous contrast. COMPARISON:  09/21/2019 FINDINGS: Brain: Chronic ischemic changes are seen within the right temporal and left occipital lobes. Chronic small vessel ischemic changes are seen within the right frontal subcortical white matter. No signs of acute infarct or hemorrhage. Lateral ventricles and midline structures are otherwise unremarkable. No acute extra-axial fluid collections. No mass effect. Vascular: No hyperdense vessel or unexpected calcification. Skull: Normal. Negative for fracture or focal lesion. Sinuses/Orbits: Chronic mucosal thickening of the bilateral maxillary sinuses, right greater than left. Mucoperiosteal thickening throughout the ethmoid air cells. Hypoplastic frontal sinuses. Other: None IMPRESSION: 1. Stable chronic ischemic changes.  No acute intracranial process. 2. Sinus disease as above. Electronically Signed   By: Randa Ngo M.D.   On: 12/17/2019 18:38   CT Cervical Spine Wo Contrast  Result Date: 12/17/2019 CLINICAL DATA:  [place], unresponsive, altered level of consciousness EXAM: CT CERVICAL SPINE WITHOUT CONTRAST TECHNIQUE: Multidetector CT imaging of the cervical spine was performed without intravenous contrast. Multiplanar CT image reconstructions were also generated. COMPARISON:  None. FINDINGS:  Alignment: Alignment is grossly anatomic. Skull base and vertebrae: No acute displaced cervical spine fractures. Soft tissues and spinal canal: No prevertebral fluid or swelling. No visible canal hematoma. Right chest wall port via internal jugular approach is identified. Disc levels: There is mild diffuse spondylosis greatest at C4-5 and C5-6.  Mild facet hypertrophy at the cervicothoracic junction. No significant bony encroachment upon the central canal. Asymmetric right neural foraminal narrowing at C4-5 and C5-6. Upper chest: Airway is patent.  Lung apices are clear. Other: Reconstructed images demonstrate no additional findings. IMPRESSION: 1. Multilevel cervical spondylosis greatest at C4-5 and C5-6. 2. No acute cervical spine fracture. Electronically Signed   By: Randa Ngo M.D.   On: 12/17/2019 18:40   DG Chest Port 1 View  Result Date: 12/17/2019 CLINICAL DATA:  [place], unresponsive EXAM: PORTABLE CHEST 1 VIEW COMPARISON:  09/21/2019 FINDINGS: Post sternotomy changes. Right-sided central venous port tip over the right atrial IVC junction. Mitral annular calcification. No pleural effusion, focal airspace disease, or pneumothorax. Stable cardiomediastinal silhouette with aortic atherosclerosis. IMPRESSION: No active disease. Right-sided central venous port tip projects over right atrial IVC junction as before. Electronically Signed   By: Donavan Foil M.D.   On: 12/17/2019 17:36   Portable EEG  Result Date: 12/17/2019 Greta Doom, MD     12/17/2019 11:49 PM History: 86 year old female presenting with episode of unresponsiveness with increased tone Sedation: None Technique: This is a 21 channel routine scalp EEG performed at the bedside with bipolar and monopolar montages arranged in accordance to the international 10/20 system of electrode placement. One channel was dedicated to EKG recording. Background: There is a posterior dominant rhythm seen at times, with a frequency of 8 to 9 Hz.  In  addition, there is admixed generalized irregular delta and theta range activity which is relatively high voltage.  There are occasional sharp waves seen at F8 >T8 with variable spread to Fp2 and F4 and P8. Photic stimulation: Physiologic driving is not performed EEG Abnormalities: 1) right frontotemporal sharp waves 2) generalized irregular slow activity Clinical Interpretation: This EEG recorded evidence of an area of potential epileptogenicity in the right frontal temporal region. There was no seizure recorded on this study. Roland Rack, MD [facility] [phone] If 7pm- 7am, please page neurology on call as listed in [place].    EKG: Independently reviewed. HR 70. Sinus rhythm. QTc 491. No STEMI. New LBBB when compared to prior EKG Nov 2020.  Assessment/Plan Principal Problem:   Collapse Active Problems:   Thrombocytopenia (HCC)   CAD (coronary artery disease)   Hyperlipidemia LDL goal <70   Chronic ITP (idiopathic thrombocytopenia) (HCC)   Port-A-Cath in place   Poor memory   Dementia with behavioral disturbance (HCC)   Hyponatremia   Acute encephalopathy  86 y.o. female with PMH of dementia with behavioral disturbances, ITP, CAD, hemorrhagic stroke who presented to ED by EMS after she had a witnessed collapse at home and unresponsive since that time.  Collapse Dementia Acute Encephalopathy of unknown etiology  - Patient with witnessed collapse at home on 3/30 and AMS thereafter - No evidence of infection from labs thus far; vitals with intermittent hypotension and bradycardia that respond to fluid bolus - follow blood cx - Head imaging without acute findings - Neuro consulted and will follow: suspect partial seizure, she was loaded with Keppra but Neuro opted to increase patient's Depakote thereafter to 500 BID - Above recs will be complicated by patient's inability to take PO at this time - Another fluid bolus ordered on admission  - Trop WNL; will cont  Tele - PT/OT when more responsive  - BP meds held on admission due to hypotension/bradycardia; restart as able  - Patient unable to tolerate PO currently; restart Trazodone, Seroquel, Lexapro when able   Hyponatremia  -  Na 132 on admission - trend in AM and see if responsive to fluid; appears hypovolemic  - Urine lytes, serum osm if not improving  Hx CAD - collapse of unknown etiology; consider cardiac cause - cont Tele; first Trop non-elevated  - Sotalol/Diltiazem held on admission due to hypotension/bradycardia; restart as able   Chronic ITP Thrombocytopenia - Plt 53 on admission which is near baseline - trend  HLD - Crestor and Zetia held on admission as patient currently not responsive enough to tolerate PO; restart as able   Code Status: Full Code; unable to discuss with patient; presumed  DVT Prophylaxis: None; thrombocytopenia  Family Communication: None. (Late hour and information obtained from spouse by EDP) Disposition Plan: Obs admission requested by Neuro however patient minimally responsive on my exam and will already be admitted one midnight by time of admission, therefore inpatient requested. Patient is at high risk for further decompensation due to age and co-morbidities. Neuro consulted. PT consulted.   Time spent: 70 minutes   Chauncey Mann, MD [facility] Pager [phone]

## 2019-12-17 NOTE — ED Notes (Signed)
[name] daughter [phone] looking for an update

## 2019-12-17 NOTE — Progress Notes (Signed)
EEG complete - results pending 

## 2019-12-17 NOTE — ED Provider Notes (Signed)
[facility] EMERGENCY DEPARTMENT Provider Note   CSN: [id number] Arrival date & time: 12/17/19  1637     History No chief complaint on file.   [name] is a 86 y.o. female.  86yo F w/ PMH including dementia w/ behavioral disturbances, CAD, ITP, hemorrhagic stroke, pancreatitis who p/w fall and unresponsiveness.  I spoke with husband who states that he was with the patient earlier this afternoon around 2:30 PM when she suddenly collapsed to the ground.  He notes that she was not responding afterwards.  He let her lay there for quite a while before calling EMS when she would not return to her baseline.  EMS notes that she has been unresponsive but with stable vital signs in route.  Husband notes that she has been in her usual state of health this morning prior to this event.  LEVEL 5 CAVEAT DUE TO DEMENTIA  The history is provided by the EMS personnel and the spouse.       Past Medical History:  Diagnosis Date  . Bilateral hearing loss   . CAD (coronary artery disease) 2003   on Plavix.   . CVA (cerebral vascular accident) ([place]) 2010  . Fatigue   . Gross hematuria 07/03/2015  . Hemorrhagic stroke ([place]) 2010  . Hiatal hernia   . Hypercholesteremia   . ITP (idiopathic thrombocytopenic purpura) 01/04/2016  . Kidney stones   . Osteoporosis   . Pancreatitis 1970  . Pancreatitis   . Thrombocytopenia ([place])   . Unspecified deficiency anemia     Patient Active Problem List   Diagnosis Date Noted  . Deficiency anemia 05/20/2019  . Acute anemia 05/07/2019  . Recurrent vertigo 11/02/2018  . Seasonal allergies 07/12/2018  . Hives of unknown origin 03/13/2018  . Financial difficulties 01/16/2018  . Poor memory 11/17/2017  . Bleeding hemorrhoid 09/22/2017  . Port-A-Cath in place 08/25/2017  . Poor venous access 05/05/2017  . Chronic ITP (idiopathic thrombocytopenia) (H[facility]) 05/02/2017  . Hyperlipidemia LDL goal <70 09/29/2016  . Idiopathic  thrombocytopenic purpura ([place]) 01/04/2016  . S/P mitral valve repair 04/26/2015  . Hypertrophic obstructive cardiomyopathy ([place]) 04/08/2014  . Intracranial hemorrhage ([place]) 04/08/2014  . Paroxysmal atrial fibrillation ([place]) 04/08/2014  . Thrombocytopenia ([place])   . Other fatigue   . Unspecified deficiency anemia     Past Surgical History:  Procedure Laterality Date  . APPENDECTOMY    . CARDIAC VALVE SURGERY  2010  . CATARACT EXTRACTION  2017   x 2  . COLONOSCOPY     [facility], Dr. Wynetta Emery  . COLONOSCOPY WITH PROPOFOL N/A 04/09/2013   Procedure: COLONOSCOPY WITH PROPOFOL;  Surgeon: Garlan Fair, MD;  Location: [facility];  Service: Endoscopy;  Laterality: N/A;  . CORONARY ARTERY BYPASS GRAFT  06/2009  . FLEXIBLE SIGMOIDOSCOPY N/A 08/03/2015   Procedure: FLEXIBLE SIGMOIDOSCOPY;  Surgeon: Garlan Fair, MD;  Location: [facility];  Service: Endoscopy;  Laterality: N/A;  . IR FLUORO GUIDE PORT INSERTION RIGHT  07/11/2017  . IR US GUIDE VASC ACCESS RIGHT  07/11/2017  . TUBAL LIGATION       OB History   No obstetric history on file.     Family History  Problem Relation Age of Onset  . Cancer Mother 94       colon  . Heart attack Father   . Cancer Sister        colon, cervical  . Stroke Sister   . Memory loss Sister   . Cancer  Brother        lung  . Dementia Brother     Social History   Tobacco Use  . Smoking status: Never Smoker  . Smokeless tobacco: Never Used  Substance Use Topics  . Alcohol use: No  . Drug use: No    Home Medications Prior to Admission medications   Medication Sig Start Date End Date Taking? Authorizing Provider  amLODipine (NORVASC) 5 MG tablet Take 1 tablet (5 mg total) by mouth daily. 09/29/16 12/17/19 Yes Belva Crome, MD  calcium-vitamin D (OSCAL WITH D) 500-200 MG-UNIT TABS tablet Take 1 tablet by mouth daily. 03/02/09  Yes [provider]  Cholecalciferol 25 MCG (1000 UT) capsule Take 1 capsule by mouth daily.  03/02/09   Yes [provider]  diltiazem (TIAZAC) 120 MG 24 hr capsule Take 120 mg by mouth daily. 07/23/09  Yes [provider]  divalproex (DEPAKOTE SPRINKLE) 125 MG capsule Take 125 mg by mouth in the morning and at bedtime. 07/31/19  Yes [provider]  EPINEPHrine (ADRENALIN) 30 MG/30ML SOLN injection Inject 1 mg as directed See admin instructions. As directed for emergency   Yes [provider]  escitalopram (LEXAPRO) 5 MG tablet Take 1 tablet (5 mg total) by mouth daily. 09/21/19  Yes Pollina, Gwenyth Allegra, MD  ezetimibe (ZETIA) 10 MG tablet Take 10 mg by mouth daily.   Yes [provider]  L-Lysine 500 MG TABS Take 500 mg by mouth daily as needed.  03/02/09  Yes [provider]  lidocaine (LIDODERM) 5 % Place 1 patch onto the skin daily. Remove & Discard patch within 12 hours or as directed by MD 09/21/19  Yes Pollina, Gwenyth Allegra, MD  meclizine (ANTIVERT) 25 MG tablet Take 25 mg by mouth 3 (three) times daily as needed for dizziness.   Yes [provider]  Omega-3 Fatty Acids (FISH OIL) 1000 MG CAPS Take 1,000 mg by mouth in the morning.  03/02/09  Yes [provider]  omeprazole (PRILOSEC) 40 MG capsule Take 40 mg by mouth daily.   Yes [provider]  QUEtiapine (SEROQUEL) 50 MG tablet Take 1 tablet (50 mg total) by mouth 3 (three) times daily. 09/21/19  Yes Pollina, Gwenyth Allegra, MD  rosuvastatin (CRESTOR) 10 MG tablet Take 10 mg by mouth daily.   Yes [provider]  sotalol (BETAPACE) 160 MG tablet Take 320 mg by mouth 2 (two) times daily.   Yes [provider]  traZODone (DESYREL) 50 MG tablet Take 1 tablet (50 mg total) by mouth at bedtime. 09/21/19  Yes Pollina, Gwenyth Allegra, MD    Allergies    Bee venom, Iodides, Iodinated diagnostic agents, Lyrica [pregabalin], Codeine, Demerol [meperidine], Ambien [zolpidem tartrate], Aspirin, Celebrex [celecoxib], Darvocet [propoxyphene n-acetaminophen], Fosamax  [alendronate sodium], Lipitor [atorvastatin], Morphine and related, Propoxyphene, Valium [diazepam], and Iodine  Review of Systems   Review of Systems  Unable to perform ROS: Dementia    Physical Exam Updated Vital Signs BP (!) 120/58   Pulse (!) 58   Temp 98.9 F (37.2 C) (Rectal)   Resp 13   SpO2 100%   Physical Exam Vitals and nursing note reviewed.  Constitutional:      Appearance: She is well-developed.     Comments: Eyes closed, not responding to voice  HENT:     Head: Normocephalic and atraumatic.     Nose: Nose normal.  Eyes:     Conjunctiva/sclera: Conjunctivae normal.     Pupils: Pupils are  equal, round, and reactive to light.  Cardiovascular:     Rate and Rhythm: Normal rate and regular rhythm.     Heart sounds: Normal heart sounds. No murmur.  Pulmonary:     Effort: Pulmonary effort is normal.     Breath sounds: Normal breath sounds.  Abdominal:     General: There is no distension.     Palpations: Abdomen is soft.     Tenderness: There is no abdominal tenderness.  Musculoskeletal:     Cervical back: Neck supple.  Skin:    General: Skin is warm and dry.     Coloration: Skin is pale.     Comments: Port R upper chest  Neurological:     Comments: Eyes closed, not following commands, increased muscle tone throughout, unwilling to turn head to left and swats my hand away with attempts; no clonus     ED Results / Procedures / Treatments   Labs (all labs ordered are listed, but only abnormal results are displayed) Labs Reviewed  VALPROIC ACID LEVEL - Abnormal; Notable for the following components:      Result Value   Valproic Acid Lvl 37 (*)    All other components within normal limits  CK - Abnormal; Notable for the following components:   Total CK 296 (*)    All other components within normal limits  COMPREHENSIVE METABOLIC PANEL - Abnormal; Notable for the following components:   Sodium 132 (*)    Glucose, Bld 158 (*)    BUN 26 (*)    Calcium 8.6  (*)    Total Protein 6.4 (*)    Albumin 3.3 (*)    All other components within normal limits  ACETAMINOPHEN LEVEL - Abnormal; Notable for the following components:   Acetaminophen (Tylenol), Serum <10 (*)    All other components within normal limits  SALICYLATE LEVEL - Abnormal; Notable for the following components:   Salicylate Lvl [id number] (*)    All other components within normal limits  CBC WITH DIFFERENTIAL/PLATELET - Abnormal; Notable for the following components:   RBC 3.51 (*)    Hemoglobin 10.2 (*)    HCT 31.4 (*)    Platelets 53 (*)    All other components within normal limits  CBG MONITORING, ED - Abnormal; Notable for the following components:   Glucose-Capillary 148 (*)    All other components within normal limits  URINE CULTURE  TSH  ETHANOL  LACTIC ACID, PLASMA  URINALYSIS, ROUTINE W REFLEX MICROSCOPIC    EKG EKG Interpretation  Date/Time:  Tuesday December 17 2019 16:40:11 EDT Ventricular Rate:  67 PR Interval:    QRS Duration: 124 QT Interval:  465 QTC Calculation: 491 R Axis:   -21 Text Interpretation: Sinus rhythm Left bundle branch block LBBB new from previous Confirmed by Theotis Burrow [phone]) on 12/17/2019 4:49:59 PM   Radiology CT Head Wo Contrast  Result Date: 12/17/2019 CLINICAL DATA:  [place], unresponsive, altered level of consciousness EXAM: CT HEAD WITHOUT CONTRAST TECHNIQUE: Contiguous axial images were obtained from the base of the skull through the vertex without intravenous contrast. COMPARISON:  09/21/2019 FINDINGS: Brain: Chronic ischemic changes are seen within the right temporal and left occipital lobes. Chronic small vessel ischemic changes are seen within the right frontal subcortical white matter. No signs of acute infarct or hemorrhage. Lateral ventricles and midline structures are otherwise unremarkable. No acute extra-axial fluid collections. No mass effect. Vascular: No hyperdense vessel or unexpected calcification. Skull: Normal. Negative  for fracture or focal  lesion. Sinuses/Orbits: Chronic mucosal thickening of the bilateral maxillary sinuses, right greater than left. Mucoperiosteal thickening throughout the ethmoid air cells. Hypoplastic frontal sinuses. Other: None IMPRESSION: 1. Stable chronic ischemic changes.  No acute intracranial process. 2. Sinus disease as above. Electronically Signed   By: Randa Ngo M.D.   On: 12/17/2019 18:38   CT Cervical Spine Wo Contrast  Result Date: 12/17/2019 CLINICAL DATA:  [place], unresponsive, altered level of consciousness EXAM: CT CERVICAL SPINE WITHOUT CONTRAST TECHNIQUE: Multidetector CT imaging of the cervical spine was performed without intravenous contrast. Multiplanar CT image reconstructions were also generated. COMPARISON:  None. FINDINGS: Alignment: Alignment is grossly anatomic. Skull base and vertebrae: No acute displaced cervical spine fractures. Soft tissues and spinal canal: No prevertebral fluid or swelling. No visible canal hematoma. Right chest wall port via internal jugular approach is identified. Disc levels: There is mild diffuse spondylosis greatest at C4-5 and C5-6. Mild facet hypertrophy at the cervicothoracic junction. No significant bony encroachment upon the central canal. Asymmetric right neural foraminal narrowing at C4-5 and C5-6. Upper chest: Airway is patent.  Lung apices are clear. Other: Reconstructed images demonstrate no additional findings. IMPRESSION: 1. Multilevel cervical spondylosis greatest at C4-5 and C5-6. 2. No acute cervical spine fracture. Electronically Signed   By: Randa Ngo M.D.   On: 12/17/2019 18:40   DG Chest Port 1 View  Result Date: 12/17/2019 CLINICAL DATA:  Fall, unresponsive EXAM: PORTABLE CHEST 1 VIEW COMPARISON:  09/21/2019 FINDINGS: Post sternotomy changes. Right-sided central venous port tip over the right atrial IVC junction. Mitral annular calcification. No pleural effusion, focal airspace disease, or pneumothorax. Stable  cardiomediastinal silhouette with aortic atherosclerosis. IMPRESSION: No active disease. Right-sided central venous port tip projects over right atrial IVC junction as before. Electronically Signed   By: Donavan Foil M.D.   On: 12/17/2019 17:36    Procedures Procedures (including critical care time)  Medications Ordered in ED Medications  sodium chloride 0.9 % bolus 500 mL (500 mLs Intravenous New Bag/Given 12/17/19 1800)    ED Course  I have reviewed the triage vital signs and the nursing notes.  Pertinent labs & imaging results that were available during my care of the patient were reviewed by me and considered in my medical decision making (see chart for details).    MDM Rules/Calculators/A&P                      Patient not opening eyes to voice but no signs of seizure activity.  She did seem to have some increased muscle tone.  She withdrew to painful stimuli.  Head CT negative acute.  Lab work overall reassuring with no evidence of severe rhabdomyolysis, reassuring CBC and CMP, negative tox labs, UA without infection, normal TSH and lactate.  Chest x-ray negative.  Contacted Dr. Leonel Ramsay, neurology, due to concern for possible seizure activity.  Patient was able to follow a few basic commands for him.  EEG showed no seizure activity but she did have some spikes that may suggest recent seizure.  Per his recommendation, ordered Keppra and discussed admission with hospitalist, Dr. Marice Potter.   After patient was admitted, her HR and BP began downtrending. I have contacted CCM, Dr. Oletta Darter, for evaluation. Final Clinical Impression(s) / ED Diagnoses Final diagnoses:  None    Rx / DC Orders ED Discharge Orders    None       Tiffiany Beadles, Wenda Overland, MD 12/18/19 [phone]

## 2019-12-17 NOTE — Consult Note (Signed)
Neurology Consultation Reason for Consult: Altered mental status Referring Physician: Little, R  CC: Altered mental status  History is obtained from: Chart review  HPI: [name] is a 86 y.o. female with a history of dementia, stroke who presents with altered mental status that started abruptly this evening.  She apparently had a fall, and was unresponsive following the fall.  She was stiff and transported via EMS.  On arrival here, she was unresponsive to anything other than noxious stimulation.  But she has gradually improved.  Neurology has been consulted for further evaluation and management.   ROS: Unable to obtain due to altered mental status.   Past Medical History:  Diagnosis Date  . Bilateral hearing loss   . CAD (coronary artery disease) 2003   on Plavix.   . CVA (cerebral vascular accident) ([place]) 2010  . Fatigue   . Gross hematuria 07/03/2015  . Hemorrhagic stroke ([place]) 2010  . Hiatal hernia   . Hypercholesteremia   . ITP (idiopathic thrombocytopenic purpura) 01/04/2016  . Kidney stones   . Osteoporosis   . Pancreatitis 1970  . Pancreatitis   . Thrombocytopenia ([place])   . Unspecified deficiency anemia      Family History  Problem Relation Age of Onset  . Cancer Mother 10       colon  . Heart attack Father   . Cancer Sister        colon, cervical  . Stroke Sister   . Memory loss Sister   . Cancer Brother        lung  . Dementia Brother      Social History:  reports that she has never smoked. She has never used smokeless tobacco. She reports that she does not drink alcohol or use drugs.   Exam: Current vital signs: BP (!) 81/41   Pulse (!) 47   Temp 98.9 F (37.2 C) (Rectal)   Resp 13   SpO2 100%  Vital signs in last 24 hours: Temp:  [97.8 F (36.6 C)-98.9 F (37.2 C)] 98.9 F (37.2 C) (03/30 1726) Pulse Rate:  [45-68] 47 (03/30 2340) Resp:  [11-25] 13 (03/30 2340) BP: (75-128)/(38-69) 81/41 (03/30 2340) SpO2:  [95 %-100 %] 100 %  (03/30 2340)   Physical Exam  Constitutional: Appears well-developed and well-nourished.  Psych: Affect appropriate to situation Eyes: No scleral injection HENT: No OP obstrucion MSK: no joint deformities.  Cardiovascular: Normal rate and regular rhythm.  Respiratory: Effort normal, non-labored breathing GI: Soft.  No distension. There is no tenderness.  Skin: WDI  Neuro: Mental Status: Patient is arousable to mild noxious stimulation, once aroused she is not very cooperative with exam.  She keeps eyes tightly shut.  She is able to tell me her name, and shakes her head no when asked if she has any pain, but then she stops cooperating with exam following this.  She followed commands readily. Cranial Nerves: II: She does not blink to threat from either direction pupils are equal, round, and reactive to light.   III,IV, VI: Eyes are midline, she does not cooperate with tracking, and actively resists doll's eye maneuver. V:VII: Corneals are intact bilaterally Motor: She moves all extremities relatively symmetrically, follows command in all 4 extremities. Sensory: She response to noxious stimulation in all 4 extremities Cerebellar: She does not perform    I have reviewed labs in epic and the results pertinent to this consultation are: UA-negative Sodium 132 VPA 37  I have reviewed the images obtained:  CT head-no acute findings  Impression: 86 year old female with episode of unresponsiveness and "stiffening" in the setting of dementia.  With her gradual improvement, I suspect that this was a partial seizure, EEG was performed which demonstrates sharp waves supporting this is the etiology.  She was loaded with Keppra, however since she is already on Depakote for behavioral reasons, I would favor using a single agent and we can increase this dose instead.  Recommendations: 1) EEG (results available at the time of finalizing this note) 2) increase Depakote to 500 twice daily 3)  neurology will follow   Roland Rack, MD [facility] [phone]  If 7pm- 7am, please page neurology on call as listed in [place].

## 2019-12-17 NOTE — Procedures (Signed)
History: 86 year old female presenting with episode of unresponsiveness with increased tone  Sedation: None  Technique: This is a 21 channel routine scalp EEG performed at the bedside with bipolar and monopolar montages arranged in accordance to the international 10/20 system of electrode placement. One channel was dedicated to EKG recording.   Background: There is a posterior dominant rhythm seen at times, with a frequency of 8 to 9 Hz.  In addition, there is admixed generalized irregular delta and theta range activity which is relatively high voltage.  There are occasional sharp waves seen at F8 >T8 with variable spread to Fp2 and F4 and P8.  Photic stimulation: Physiologic driving is not performed  EEG Abnormalities: 1) right frontotemporal sharp waves 2) generalized irregular slow activity  Clinical Interpretation: This EEG recorded evidence of an area of potential epileptogenicity in the right frontal temporal region. There was no seizure recorded on this study.   Roland Rack, MD [facility] [phone]  If 7pm- 7am, please page neurology on call as listed in [place].

## 2019-12-17 NOTE — ED Triage Notes (Signed)
Per [facility] patient had witnessed fall from husband at 2:30, unsure if she hit head. According to husband patient has been unresponsive since. Patient very stiff and coughing. Redness noted around port-a-cath site. Husband adds patients bottle with Divalproex had lid off and unsure if patient took any.

## 2019-12-18 DIAGNOSIS — G9340 Encephalopathy, unspecified: Secondary | ICD-10-CM

## 2019-12-18 DIAGNOSIS — R569 Unspecified convulsions: Secondary | ICD-10-CM

## 2019-12-18 DIAGNOSIS — D696 Thrombocytopenia, unspecified: Secondary | ICD-10-CM

## 2019-12-18 DIAGNOSIS — Z95828 Presence of other vascular implants and grafts: Secondary | ICD-10-CM

## 2019-12-18 DIAGNOSIS — R0989 Other specified symptoms and signs involving the circulatory and respiratory systems: Secondary | ICD-10-CM | POA: Insufficient documentation

## 2019-12-18 DIAGNOSIS — E871 Hypo-osmolality and hyponatremia: Secondary | ICD-10-CM

## 2019-12-18 DIAGNOSIS — F0151 Vascular dementia with behavioral disturbance: Secondary | ICD-10-CM

## 2019-12-18 DIAGNOSIS — R4182 Altered mental status, unspecified: Secondary | ICD-10-CM | POA: Insufficient documentation

## 2019-12-18 DIAGNOSIS — F0391 Unspecified dementia with behavioral disturbance: Secondary | ICD-10-CM

## 2019-12-18 DIAGNOSIS — D693 Immune thrombocytopenic purpura: Secondary | ICD-10-CM

## 2019-12-18 DIAGNOSIS — G40009 Localization-related (focal) (partial) idiopathic epilepsy and epileptic syndromes with seizures of localized onset, not intractable, without status epilepticus: Secondary | ICD-10-CM

## 2019-12-18 LAB — CBC
HCT: 34.6 % — ABNORMAL LOW (ref 36.0–46.0)
Hemoglobin: 11.1 g/dL — ABNORMAL LOW (ref 12.0–15.0)
MCH: 29.1 pg (ref 26.0–34.0)
MCHC: 32.1 g/dL (ref 30.0–36.0)
MCV: 90.8 fL (ref 80.0–100.0)
Platelets: 18 10*3/uL — CL (ref 150–400)
RBC: 3.81 MIL/uL — ABNORMAL LOW (ref 3.87–5.11)
RDW: 14.2 % (ref 11.5–15.5)
WBC: 7.4 10*3/uL (ref 4.0–10.5)
nRBC: 0 % (ref 0.0–0.2)

## 2019-12-18 LAB — COMPREHENSIVE METABOLIC PANEL
ALT: 21 U/L (ref 0–44)
AST: 31 U/L (ref 15–41)
Albumin: 2.9 g/dL — ABNORMAL LOW (ref 3.5–5.0)
Alkaline Phosphatase: 45 U/L (ref 38–126)
Anion gap: 8 (ref 5–15)
BUN: 18 mg/dL (ref 8–23)
CO2: 21 mmol/L — ABNORMAL LOW (ref 22–32)
Calcium: 8.2 mg/dL — ABNORMAL LOW (ref 8.9–10.3)
Chloride: 110 mmol/L (ref 98–111)
Creatinine, Ser: 0.82 mg/dL (ref 0.44–1.00)
GFR calc Af Amer: >60 mL/min (ref >60)
GFR calc non Af Amer: >60 mL/min (ref >60)
Glucose, Bld: 105 mg/dL — ABNORMAL HIGH (ref 70–99)
Potassium: 4.2 mmol/L (ref 3.5–5.1)
Sodium: 139 mmol/L (ref 135–145)
Total Bilirubin: 0.5 mg/dL (ref 0.3–1.2)
Total Protein: 5.8 g/dL — ABNORMAL LOW (ref 6.5–8.1)

## 2019-12-18 LAB — TROPONIN I (HIGH SENSITIVITY): Troponin I (High Sensitivity): 8 ng/L (ref <18)

## 2019-12-18 LAB — SARS CORONAVIRUS 2 (TAT 6-24 HRS): SARS Coronavirus 2: NEGATIVE

## 2019-12-18 MED ORDER — SODIUM CHLORIDE 0.9 % IV BOLUS
1000.0000 mL | Freq: Once | INTRAVENOUS | Status: AC
  Administered 2019-12-18: 1000 mL via INTRAVENOUS

## 2019-12-18 MED ORDER — DIPHENHYDRAMINE HCL 50 MG/ML IJ SOLN
12.5000 mg | Freq: Three times a day (TID) | INTRAMUSCULAR | Status: DC | PRN
  Administered 2019-12-18 – 2019-12-21 (×3): 12.5 mg via INTRAVENOUS
  Filled 2019-12-18 (×3): qty 1

## 2019-12-18 MED ORDER — VALPROATE SODIUM 500 MG/5ML IV SOLN
125.0000 mg | Freq: Two times a day (BID) | INTRAVENOUS | Status: DC
  Administered 2019-12-18 – 2019-12-20 (×5): 125 mg via INTRAVENOUS
  Filled 2019-12-18 (×6): qty 1.25

## 2019-12-18 MED ORDER — LACTATED RINGERS IV SOLN: INTRAVENOUS | Status: AC

## 2019-12-18 MED ORDER — LACTATED RINGERS IV BOLUS
1000.0000 mL | Freq: Once | INTRAVENOUS | Status: AC
  Administered 2019-12-18: 1000 mL via INTRAVENOUS

## 2019-12-18 MED ORDER — HALOPERIDOL LACTATE 5 MG/ML IJ SOLN
1.0000 mg | Freq: Once | INTRAMUSCULAR | Status: AC
  Administered 2019-12-18: 1 mg via INTRAVENOUS
  Filled 2019-12-18: qty 1

## 2019-12-18 MED ORDER — SODIUM CHLORIDE 0.9 % IV SOLN
250.0000 mg | Freq: Two times a day (BID) | INTRAVENOUS | Status: DC
  Administered 2019-12-18 – 2019-12-20 (×4): 250 mg via INTRAVENOUS
  Filled 2019-12-18 (×5): qty 2.5

## 2019-12-18 MED ORDER — ORAL CARE MOUTH RINSE
15.0000 mL | Freq: Two times a day (BID) | OROMUCOSAL | Status: DC
  Administered 2019-12-18 – 2019-12-20 (×6): 15 mL via OROMUCOSAL

## 2019-12-18 MED ORDER — CHLORHEXIDINE GLUCONATE CLOTH 2 % EX PADS
6.0000 | MEDICATED_PAD | Freq: Every day | CUTANEOUS | Status: DC
  Administered 2019-12-18 – 2019-12-21 (×4): 6 via TOPICAL

## 2019-12-18 NOTE — Consult Note (Signed)
[facility]--Pt has been on [facility] service since Dec 2020 after receiving referral from Dr [name] for vascular dementia.  She lives at home with spouse-Edgar.  Will continue to follow hospital course.  Please contact [facility] if we can assist with d/c planning.  Will plan to resume services when pt returns home.  Thank you.  Wynetta Fines, RN [phone]

## 2019-12-18 NOTE — Progress Notes (Addendum)
Subjective: No further seizure-like episodes overnight.  No new concerns this morning.  ROS: negative except above  Examination  Vital signs in last 24 hours: Temp:  [96.2 F (35.7 C)-98.9 F (37.2 C)] 98.6 F (37 C) (03/31 0429) Pulse Rate:  [45-68] 50 (03/31 0800) Resp:  [11-25] 15 (03/31 1000) BP: (75-138)/(38-69) 103/57 (03/31 0832) SpO2:  [95 %-100 %] 98 % (03/31 1000)  General: lying in bed, not in apparent distress CVS: pulse-normal rate and rhythm RS: breathing comfortably Extremities: normal   Neuro: MS: Alert, oriented to self, knows she is in hospital only when provided with options, follows commands CN: pupils equal and reactive,  EOMI, face symmetric, tongue midline, normal sensation over face Motor: 3/5 strength in all 4 extremities  Basic Metabolic Panel: Recent Labs  Lab 12/17/19 1655 12/18/19 0417  NA 132* 139  K 4.6 4.2  CL 100 110  CO2 22 21*  GLUCOSE 158* 105*  BUN 26* 18  CREATININE 0.85 0.82  CALCIUM 8.6* 8.2*    CBC: Recent Labs  Lab 12/17/19 1655 12/18/19 0417  WBC 6.8 7.4  NEUTROABS 4.9  --   HGB 10.2* 11.1*  HCT 31.4* 34.6*  MCV 89.5 90.8  PLT 53* 18*     Coagulation Studies: No results for input(s): LABPROT, INR in the last 72 hours.  Imaging   CT head wo contrast 12/17/2019: No acute intracranial process. Chronic ischemic changes are seen within the right temporal and left occipital lobes. Chronic small vessel ischemic changes are seen within the right frontal subcortical white matter.    ASSESSMENT AND PLAN: 86 year old female presented with episode of unresponsiveness and stiffening in the setting of dementia.  EEG showed sharp waves in right frontotemporal region.  Epilepsy Dementia Chronic strokes Hyponatremia (resolved) Hyperglycemia Elevated BUN (resolved) Hypoalbuminemia Hypocalcemia Elevated CK Idiopathic thrombocytopenic purpura -Patient's platelets dropped from 53 to 18 today which could be secondary to  valproic acid.  Recommendations -Repeat CBC ordered.  If platelets continue to be less than 50, would recommend switching valproic acid back to home dose of 125 mg twice daily for mood  -Ideally I would have preferred an antiepileptic with mood stabilizing properties.  However, given patient's dementia and multiple comorbidities, I would initially start with Keppra 250 mg twice daily for seizures.  If patient experiences irritability or worsening behavior, can switch to oxcarbazepine or lamotrigine. -Seizure precautions -As needed IV Ativan 2 mg only for generalized tonic-clonic seizure lasting more than 2 minutes while inpatient -Follow-up with neurology in 8 to 12 weeks -Management of other comorbidities per primary team  Thank you for allowing [place] to participate in the care of this patient.  Neurology will follow.  Please page neuro hospitalist for any further questions after 5 PM.  I have spent a total of 35  minutes with the patient reviewing hospital notes,  test results, labs and examining the patient as well as establishing an assessment and plan that was discussed personally with the patient and her team.  > 50% of time was spent in direct patient care.    Rayden Dock [name]

## 2019-12-18 NOTE — Progress Notes (Signed)
Dr. Florencia Reasons paged to make aware of critical platelet count of 18k/UL. Waiting on order. Will continue to monitor

## 2019-12-18 NOTE — Progress Notes (Signed)
   NAME:  [name], MRN:  [id number], DOB:  [birth year], LOS: 1 ADMISSION DATE:  12/17/2019, CONSULTATION DATE:  12/18/2019 REFERRING MD:  Dr Erlinda Hong, CHIEF COMPLAINT: Altered mental status  Brief History   86 yr old Female w/ PMHx CAD, CVA, ITP on IVIG, presents with AMS s/p fall. PCCM was consulted due to a low BP ( 81/56mmHg w/ HR 47 bpm) and asked to evaluate She ran out of some of her medications recently Spouse activated EMS following patient collapse, vitals stable at initial evaluation Altered mental status when she initially presented to the hospital, fluid resuscitated Mental status has since improved  Past Medical History   Past Medical History:  Diagnosis Date  . Bilateral hearing loss   . CAD (coronary artery disease) 2003   on Plavix.   . CVA (cerebral vascular accident) ([place] [place]) 2010  . Fatigue   . Gross hematuria 07/03/2015  . Hemorrhagic stroke ([place]) 2010  . Hiatal hernia   . Hypercholesteremia   . ITP (idiopathic thrombocytopenic purpura) 01/04/2016  . Kidney stones   . Osteoporosis   . Pancreatitis 1970  . Pancreatitis   . Thrombocytopenia ([place])   . Unspecified deficiency anemia     Significant Hospital Events   Presented at [id number] to [facility] 1.7  Unresponsive  Hypotension Consults:  PCCM  Procedures:  None   Significant Diagnostic Tests:  CT Head w/o contrast: IMPRESSION: 1. Stable chronic ischemic changes. No acute intracranial process. 2. Sinus disease.  CT Cervical Spine w/o contrast: IMPRESSION: 1. Multilevel cervical spondylosis greatest at C4-5 and C5-6. 2. No acute cervical spine fracture.  IMPRESSION: No active disease. Right-sided central venous port tip projects over right atrial IVC junction as before.   Micro Data:  Blood culture x2>> SARS Covid negative  Antimicrobials:  none  Interim history/subjective:  Awake, interactive  Objective   Blood pressure (!) 103/57, pulse (!) 50, temperature 98.6 F (37 C), temperature  source Oral, resp. rate 15, SpO2 98 %.        Intake/Output Summary (Last 24 hours) at 12/18/2019 1119 Last data filed at 12/18/2019 0225 Gross per 24 hour  Intake 3100 ml  Output --  Net 3100 ml    Examination: General: Elderly lady, does appear comfortable HENT: Moist oral mucosa Lungs: Clear breath sounds bilaterally Cardiovascular: S1-S2 appreciated Abdomen: Bowel sounds appreciated Extremities: No clubbing, no edema  Resolved Hospital Problem list   Hypotension-resolved  Assessment & Plan:  Acute encephalopathy -Continue neurochecks -Neurology following for partial seizures  Brief episode of hypotension -This is resolved -No need for vasopressors -Appears to be fluid responsive  History of coronary artery disease -Continue lines of care  Hyponatremia  Chronic ITP  Patient appears stable from critical care perspective CCM will sign off  Reengage if needed  Sherrilyn Rist, MD [place] [facility] Pager: [phone]

## 2019-12-18 NOTE — Evaluation (Signed)
Physical Therapy Evaluation Patient Details Name: [name] MRN: [id number] DOB: 1933/01/18 Today's Date: 12/18/2019   History of Present Illness  [name] is a 86 y.o. female with PMH of dementia with behavioral disturbances, ITP, CAD, hemorrhagic stroke who presented to ED by EMS after she had a witnessed collapse at home and unresponsiveness. CT head negative for acute findings. "EEG recorded evidence of an area of potential epileptogenicity in the right frontal temporal region. There was no seizure recorded on this study."  Clinical Impression  Pt admitted with above. Prior to admission, pt lives with husband and is independent with mobility, requires assist for ADL's. History of cognitive impairments at baseline secondary to dementia. Pt is oriented to self only but able to follow one step commands. Presents with lethargy, decreased cognition, balance impairments, weakness and orthostatic hypotension. Requiring two person min-mod assist for basic stand pivot transfers. Pt reporting dizziness with transition to standing (see below). Will continue to follow acutely to progress mobility as tolerated.  Orthostatic Vital Signs: Supine: 134/56 Sitting: 132/63 Standing: 103/57     Follow Up Recommendations Home health PT;Supervision/Assistance - 24 hour (pt husband declining SNF)    Equipment Recommendations  3in1 (PT);Wheelchair (measurements PT);Wheelchair cushion (measurements PT)    Recommendations for Other Services       Precautions / Restrictions Precautions Precautions: Fall;Other (comment) Precaution Comments: hypotension Restrictions Weight Bearing Restrictions: No      Mobility  Bed Mobility Overal bed mobility: Needs Assistance Bed Mobility: Supine to Sit;Sit to Supine     Supine to sit: Max assist;+2 for physical assistance Sit to supine: Max assist;+2 for physical assistance   General bed mobility comments: MaxA + 2 for initiation and execution  of task with assist at trunk and BLE negotiation  Transfers Overall transfer level: Needs assistance Equipment used: 2 person hand held assist Transfers: Sit to/from Stand;Stand Pivot Transfers Sit to Stand: Min assist;+2 safety/equipment Stand pivot transfers: Min assist;Mod assist;+2 safety/equipment       General transfer comment: Min-modA to stand from edge of bed and pivot to and from [facility]. Handheld guidance utilized to facilitate direction/sequencing.  Ambulation/Gait                Stairs            Wheelchair Mobility    Modified Rankin (Stroke Patients Only)       Balance Overall balance assessment: Needs assistance Sitting-balance support: Feet supported Sitting balance-Leahy Scale: Fair Sitting balance - Comments: Initially requiring mod-maxA but able to progress to close supervision with no UE support   Standing balance support: Bilateral upper extremity supported;During functional activity Standing balance-Leahy Scale: Poor Standing balance comment: reliant on external support                             Pertinent Vitals/Pain Pain Assessment: No/denies pain    Home Living Family/patient expects to be discharged to:: Private residence Living Arrangements: Spouse/significant other Available Help at Discharge: Family;Available 24 hours/day Type of Home: House Home Access: Stairs to enter   [employer] of Steps: 3 Home Layout: Able to live on main level with bedroom/bathroom Home Equipment: None      Prior Function Level of Independence: Needs assistance   Gait / Transfers Assistance Needed: independent   ADL's / Homemaking Assistance Needed: requires assist for dressing, bathing        Hand Dominance  Extremity/Trunk Assessment   Upper Extremity Assessment Upper Extremity Assessment: Defer to OT evaluation    Lower Extremity Assessment Lower Extremity Assessment: RLE deficits/detail;LLE  deficits/detail RLE Deficits / Details: At least 3/5 LLE Deficits / Details: At least 3/5       Communication   Communication: No difficulties  Cognition Arousal/Alertness: Lethargic Behavior During Therapy: Flat affect Overall Cognitive Status: History of cognitive impairments - at baseline                                 General Comments: History of dementia. Pt able to follow 1 step commands. Oriented to self only. Able to initially state she was in the hospital, but then perseverating on asking, "where am I? what happened," despite re-orientation.      General Comments      Exercises     Assessment/Plan    PT Assessment Patient needs continued PT services  PT Problem List Decreased strength;Decreased activity tolerance;Decreased balance;Decreased mobility;Decreased cognition;Decreased safety awareness       PT Treatment Interventions DME instruction;Gait training;Stair training;Functional mobility training;Therapeutic activities;Therapeutic exercise;Balance training;Patient/family education;Wheelchair mobility training    PT Goals (Current goals can be found in the Care Plan section)  Acute Rehab PT Goals Patient Stated Goal: pt husband would like to take her home PT Goal Formulation: With patient/family Time For Goal Achievement: 01/01/20 Potential to Achieve Goals: Fair    Frequency Min 3X/week   Barriers to discharge        Co-evaluation PT/OT/SLP Co-Evaluation/Treatment: Yes Reason for Co-Treatment: Complexity of the patient's impairments (multi-system involvement);Necessary to address cognition/behavior during functional activity;For patient/therapist safety;To address functional/ADL transfers PT goals addressed during session: Mobility/safety with mobility         AM-PAC PT "6 Clicks" Mobility  Outcome Measure Help needed turning from your back to your side while in a flat bed without using bedrails?: A Lot Help needed moving from lying  on your back to sitting on the side of a flat bed without using bedrails?: Total Help needed moving to and from a bed to a chair (including a wheelchair)?: A Lot Help needed standing up from a chair using your arms (e.g., wheelchair or bedside chair)?: A Little Help needed to walk in hospital room?: A Lot Help needed climbing 3-5 steps with a railing? : A Lot 6 Click Score: 12    End of Session Equipment Utilized During Treatment: Gait belt Activity Tolerance: Patient tolerated treatment well Patient left: in bed;with call bell/phone within reach;with bed alarm set Nurse Communication: Mobility status PT Visit Diagnosis: Unsteadiness on feet (R26.81);History of falling (Z91.81);Muscle weakness (generalized) (M62.81);Difficulty in walking, not elsewhere classified (R26.2)    Time: [id number] PT Time Calculation (min) (ACUTE ONLY): 25 min   Charges:   PT Evaluation $PT Eval Moderate Complexity: 1 Mod            Wyona Almas, PT, DPT Acute Rehabilitation Services Pager [phone] Office [phone]   Deno Etienne 12/18/2019, 9:53 AM

## 2019-12-18 NOTE — Progress Notes (Addendum)
PROGRESS NOTE  [name] [id number] DOB: Jul 22, 1933 DOA: 12/17/2019 PCP: [name], MD  Brief summary:  [name] is a 86 y.o. female with PMH of dementia with behavioral disturbances, ITP, CAD, hemorrhagic stroke who presented to ED by EMS after she had a witnessed collapse at home and unresponsive since that time.   Patient was brought to ER by EMS after her husband witnessed her collapse around 1430. He was unsure if she hit her head. She remained unresponsive since that time and he finally called EMS. Husband noted that patient with Divalproex bottle with lid off and unsure if she took any  In the ED: Vitals initially stable but patient became bradycardic and hypotensive. Improved with fluid boluses. Requested CCM consult and they recommended Stepdown unit as patient fluid responsive. Labs remarkable for: TSH/Ethanol Level/Tylenol level/Salicylate level/Lactate/Troponin WNL. UA without evidence of infection. Valproic acid level 37, WBC 6.8, Plt 53, CK 296, Na 132 otherwise CMP WNL. CXR, CT Head/C-Spine without acute findings. Neuro consulted and performed EEG and gave Keppra; Suspect partial seizure     HPI/Recap of past 24 hours:  She is awake this morning, physical therapy got her up to bedside commode and now she is back in bed Platelet is low, no sign of bleeding, no petechiae, urine in canister is clear  Blood pressure stable, sinus bradycardia in the 50s on telemetry  Assessment/Plan: Principal Problem:   Collapse Active Problems:   Thrombocytopenia (HCC)   CAD (coronary artery disease)   Hyperlipidemia LDL goal <70   Chronic ITP (idiopathic thrombocytopenia) (HCC)   Port-A-Cath in place   Poor memory   Dementia with behavioral disturbance (HCC)   Hyponatremia   Acute encephalopathy   Altered mental status, in the setting of advanced dementia -Was reported unresponsive at home -TSH/Ethanol Level/Tylenol level/Salicylate  level/Lactate/Troponin WNL, valproic acid level 37 -CT of the head no acute findings, no sign of infection identified so far -neurology consulted, suspected partial seizure, EEG performed "This EEG recorded evidence of an area of potential epileptogenicity in the right frontal temporal region. There was no seizure recorded on this study. " -Depakote dose increased to 500 twice a day -She is still n.p.o., currently sleepy, speech eval ordered, continue IV hydration for now -Neurology input appreciated, will follow recommendation  Hypotension/hypothermia -No fever, no leukocytosis, lactic acid level unremarkable ,no source of infection identified -TSH unremarkable -Fluids responsive -Monitor, hold home blood pressure medication , continue hydration, check am cortison  Hyponatremia -Sodium 132 on presentation, normalized with hydration   Thrombocytopenia, with history of ITP -Platelet this morning is 18.  No sign of bleeding -Case discussed with hematology oncology Dr. Alvy Bimler who knows the patient very well recommend observation, no indication for treatment unless bleeding or platelet less than 10.  Dr. Alvy Bimler recommend palliative care consult as well.  As treatment for ITP has been rather difficult due to patient advanced dementia and poor social support -Blood culture,urine culture in process to rule out infection ,currently no fever -Total bilirubin within normal limit, less likely hemolysis -Dr. Alvy Bimler will see patient in the morning  Dementia with behavior disturbances  DVT Prophylaxis: SCDs due to thrombocytopenia  Code Status: Currently listed as full, will verify with husband (per hematology oncology Dr. Alvy Bimler patient at one time in the past enrolled in hospice care)  Family Communication: patient , will attempt to call husband today  Disposition Plan:    Patient came from:  Home with husband                                                                                                Anticipated d/c place:  To be determined  Barriers to d/c OR conditions which need to be met to effect a safe d/c:  Thrombocytopenia, behavior issue in the setting of advanced dementia   Consultants:  Hematology oncology Dr. Alvy Bimler  Neurology  Critical care  Palliative care  Procedures:  None  Antibiotics:  None   Objective: BP (!) 103/57   Pulse (!) 50   Temp 98.6 F (37 C) (Oral)   Resp 13   SpO2 100%   Intake/Output Summary (Last 24 hours) at 12/18/2019 8182 Last data filed at 12/18/2019 0225 Gross per 24 hour  Intake 3100 ml  Output --  Net 3100 ml   There were no vitals filed for this visit.  Exam: Patient is examined daily including today on 12/18/2019, exams remain the same as of yesterday except that has changed    General: She is sleepy, ask" where am I"  Cardiovascular: RRR  Respiratory: CTABL  Abdomen: Soft/ND/NT, positive BS  Musculoskeletal: No Edema  Neuro: Sleepy  Data Reviewed: Basic Metabolic Panel: Recent Labs  Lab 12/17/19 1655 12/18/19 0417  NA 132* 139  K 4.6 4.2  CL 100 110  CO2 22 21*  GLUCOSE 158* 105*  BUN 26* 18  CREATININE 0.85 0.82  CALCIUM 8.6* 8.2*   Liver Function Tests: Recent Labs  Lab 12/17/19 1655 12/18/19 0417  AST 27 31  ALT 23 21  ALKPHOS 50 45  BILITOT 0.6 0.5  PROT 6.4* 5.8*  ALBUMIN 3.3* 2.9*   No results for input(s): LIPASE, AMYLASE in the last 168 hours. No results for input(s): AMMONIA in the last 168 hours. CBC: Recent Labs  Lab 12/17/19 1655 12/18/19 0417  WBC 6.8 7.4  NEUTROABS 4.9  --   HGB 10.2* 11.1*  HCT 31.4* 34.6*  MCV 89.5 90.8  PLT 53* 18*   Cardiac Enzymes:   Recent Labs  Lab 12/17/19 1655  CKTOTAL 296*   BNP (last 3 results) No results for input(s): BNP in the last 8760 hours.  ProBNP (last 3 results) No results for input(s): PROBNP in the last 8760 hours.  CBG: Recent Labs  Lab 12/17/19 1642  GLUCAP 148*    Recent  Results (from the past 240 hour(s))  SARS CORONAVIRUS 2 (TAT 6-24 HRS) Nasopharyngeal Nasopharyngeal Swab     Status: None   Collection Time: 12/17/19 10:36 PM   Specimen: Nasopharyngeal Swab  Result Value Ref Range Status   SARS Coronavirus 2 NEGATIVE NEGATIVE Final    Comment: (NOTE) SARS-CoV-2 target nucleic acids are NOT DETECTED. The SARS-CoV-2 RNA is generally detectable in upper and lower respiratory specimens during the acute phase of infection. Negative results do not preclude SARS-CoV-2 infection, do not rule out co-infections with other pathogens, and should not be used as the sole basis for treatment or other patient management decisions. Negative results must be combined with clinical observations, patient history, and epidemiological information. The expected  result is Negative. Fact Sheet for Patients: SugarRoll.be Fact Sheet for Healthcare Providers: https://www.woods-mathews.com/ This test is not yet approved or cleared by the [place] FDA and  has been authorized for detection and/or diagnosis of SARS-CoV-2 by FDA under an Emergency Use Authorization (EUA). This EUA will remain  in effect (meaning this test can be used) for the duration of the COVID-19 declaration under Section 56 4(b)(1) of the Act, 21 U.S.C. section 360bbb-3(b)(1), unless the authorization is terminated or revoked sooner. Performed at [facility], [place] [address] [place], [place] [address]      Studies: CT Head Wo Contrast  Result Date: 12/17/2019 CLINICAL DATA:  [place], unresponsive, altered level of consciousness EXAM: CT HEAD WITHOUT CONTRAST TECHNIQUE: Contiguous axial images were obtained from the base of the skull through the vertex without intravenous contrast. COMPARISON:  09/21/2019 FINDINGS: Brain: Chronic ischemic changes are seen within the right temporal and left occipital lobes. Chronic small vessel ischemic changes are seen within  the right frontal subcortical white matter. No signs of acute infarct or hemorrhage. Lateral ventricles and midline structures are otherwise unremarkable. No acute extra-axial fluid collections. No mass effect. Vascular: No hyperdense vessel or unexpected calcification. Skull: Normal. Negative for fracture or focal lesion. Sinuses/Orbits: Chronic mucosal thickening of the bilateral maxillary sinuses, right greater than left. Mucoperiosteal thickening throughout the ethmoid air cells. Hypoplastic frontal sinuses. Other: None IMPRESSION: 1. Stable chronic ischemic changes.  No acute intracranial process. 2. Sinus disease as above. Electronically Signed   By: Randa Ngo M.D.   On: 12/17/2019 18:38   CT Cervical Spine Wo Contrast  Result Date: 12/17/2019 CLINICAL DATA:  [place], unresponsive, altered level of consciousness EXAM: CT CERVICAL SPINE WITHOUT CONTRAST TECHNIQUE: Multidetector CT imaging of the cervical spine was performed without intravenous contrast. Multiplanar CT image reconstructions were also generated. COMPARISON:  None. FINDINGS: Alignment: Alignment is grossly anatomic. Skull base and vertebrae: No acute displaced cervical spine fractures. Soft tissues and spinal canal: No prevertebral fluid or swelling. No visible canal hematoma. Right chest wall port via internal jugular approach is identified. Disc levels: There is mild diffuse spondylosis greatest at C4-5 and C5-6. Mild facet hypertrophy at the cervicothoracic junction. No significant bony encroachment upon the central canal. Asymmetric right neural foraminal narrowing at C4-5 and C5-6. Upper chest: Airway is patent.  Lung apices are clear. Other: Reconstructed images demonstrate no additional findings. IMPRESSION: 1. Multilevel cervical spondylosis greatest at C4-5 and C5-6. 2. No acute cervical spine fracture. Electronically Signed   By: Randa Ngo M.D.   On: 12/17/2019 18:40   DG Chest Port 1 View  Result Date: 12/17/2019 CLINICAL  DATA:  Fall, unresponsive EXAM: PORTABLE CHEST 1 VIEW COMPARISON:  09/21/2019 FINDINGS: Post sternotomy changes. Right-sided central venous port tip over the right atrial IVC junction. Mitral annular calcification. No pleural effusion, focal airspace disease, or pneumothorax. Stable cardiomediastinal silhouette with aortic atherosclerosis. IMPRESSION: No active disease. Right-sided central venous port tip projects over right atrial IVC junction as before. Electronically Signed   By: Donavan Foil M.D.   On: 12/17/2019 17:36   Portable EEG  Result Date: 12/17/2019 Greta Doom, MD     12/17/2019 11:49 PM History: 86 year old female presenting with episode of unresponsiveness with increased tone Sedation: None Technique: This is a 21 channel routine scalp EEG performed at the bedside with bipolar and monopolar montages arranged in accordance to the international 10/20 system of electrode placement. One channel was dedicated to EKG recording. Background: There  is a posterior dominant rhythm seen at times, with a frequency of 8 to 9 Hz.  In addition, there is admixed generalized irregular delta and theta range activity which is relatively high voltage.  There are occasional sharp waves seen at F8 >T8 with variable spread to Fp2 and F4 and P8. Photic stimulation: Physiologic driving is not performed EEG Abnormalities: 1) right frontotemporal sharp waves 2) generalized irregular slow activity Clinical Interpretation: This EEG recorded evidence of an area of potential epileptogenicity in the right frontal temporal region. There was no seizure recorded on this study. Roland Rack, MD [facility] [phone] If 7pm- 7am, please page neurology on call as listed in [place].    Scheduled Meds: . Chlorhexidine Gluconate Cloth  6 each Topical Daily  . divalproex  500 mg Oral Q12H  . mouth rinse  15 mL Mouth Rinse q12n4p    Continuous Infusions: . lactated ringers       Time spent:  51mns, case discussed with hematology oncology Dr. GAlvy Bimlerover the phone I have personally reviewed and interpreted on  12/18/2019 daily labs, tele strips, imagings as discussed above under date review session and assessment and plans.  I reviewed all nursing notes, pharmacy notes, consultant notes,  vitals, pertinent old records  I have discussed plan of care as described above with RN , patient  on 12/18/2019   FFlorencia ReasonsMD, PhD, FACP  [facility]  Available via Epic secure chat 7am-7pm for nonurgent issues Please page for urgent issues, pager number available through [email or web address] .   12/18/2019, 9:37 AM  LOS: 1 day

## 2019-12-18 NOTE — Consult Note (Signed)
..   NAME:  [name], MRN:  [id number], DOB:  02-07-1933, LOS: 1 ADMISSION DATE:  12/17/2019, CONSULTATION DATE:  12/18/2019 REFERRING MD:  LITTLE MD, CHIEF COMPLAINT:  AMS   Brief History   86 yr old Female w/ PMHx CAD, CVA, ITP on IVIG, presents with AMS s/p fall. PCCM was consulted due to a low BP ( 81/10mmHg w/ HR 47 bpm) and asked to evaluate  History of present illness   ( History obtained from EMR and acct of other providers)  86 year old female with past medical history significant for CAD, CVA, status post mitral valve repair, ITP on IVIG, paroxysmal A. Fib (previously on sotalol 160 mg twice daily) , HLD, GERD, dementia diagnosed in 2010 with behavioral disturbance documented in 2020 ( Rx with trazodone, escitalopram, quetiapine), and seasonal allergies treated with Zyrtec.  Last seen in the ED on 09/21/2019 where she was evaluated post fall and was very agitated.  Of note she had ran out of her meds (specifically trazodone, Lexapro and Seroquel).  That time the ED physician had offered her primary caregiver AKA has been skilled nursing placement which he denied.  She presents again on 12/17/2019 status post fall with unresponsiveness.  Her husband had stated that around 2:30 PM she suddenly collapsed to the ground and then was not responding afterwards.  Called EMS who noted the patient's vital signs to be stable on evaluation.  Patient was evaluated in the ED with CT head without contrast, CT cervical spine without contrast, and portable chest x-ray.  Upon review of the imaging there was no acute process any of the scans.  Several hours after patient being in the ED. PCCM asked to evaluate for heart rate and BP down-trending.  Prior to this patient had only received a 500 cc IV fluid bolus.  Upon my evaluation patient sleeping comfortably in bed receiving 1 L of normal saline and 1 L of LR.  MAP> 65 mmHg.  Not opening her eyes to verbal stimuli is unchanged from her prior exam.     Past Medical History  .Marland Kitchen Active Ambulatory Problems    Diagnosis Date Noted  . Thrombocytopenia ([place])   . Other fatigue   . Unspecified deficiency anemia   . Hypertrophic obstructive cardiomyopathy ([place]) 04/08/2014  . CAD (coronary artery disease) 04/08/2014  . Intracranial hemorrhage ([place]) 04/08/2014  . Paroxysmal atrial fibrillation ([place]) 04/08/2014  . S/P mitral valve repair 04/26/2015  . Idiopathic thrombocytopenic purpura ([place]) 01/04/2016  . Hyperlipidemia LDL goal <70 09/29/2016  . Chronic ITP (idiopathic thrombocytopenia) ([facility]) 05/02/2017  . Poor venous access 05/05/2017  . Port-A-Cath in place 08/25/2017  . Bleeding hemorrhoid 09/22/2017  . Poor memory 11/17/2017  . Financial difficulties 01/16/2018  . Hives of unknown origin 03/13/2018  . Seasonal allergies 07/12/2018  . Recurrent vertigo 11/02/2018  . Acute anemia 05/07/2019  . Deficiency anemia 05/20/2019   Resolved Ambulatory Problems    Diagnosis Date Noted  . Gross hematuria 07/03/2015   Past Medical History:  Diagnosis Date  . Bilateral hearing loss   . CVA (cerebral vascular accident) ([place]) 2010  . Fatigue   . Hemorrhagic stroke ([place]) 2010  . Hiatal hernia   . Hypercholesteremia   . ITP (idiopathic thrombocytopenic purpura) 01/04/2016  . Kidney stones   . Osteoporosis   . Pancreatitis 1970  . Pancreatitis      Significant Hospital Events   Presented at [id number] to [facility] [place] 1.7  Unresponsive   Consults:  03/30 Neurology 03/31  PCCM  Procedures:  None at the time of my evaluation  Significant Diagnostic Tests:  EKG Interpretation  Date/Time:                  Tuesday December 17 2019 16:40:11 EDT Ventricular Rate:         67 PR Interval:                   QRS Duration: 124 QT Interval:                 465 QTC Calculation:        491 R Axis:                         -21 Text Interpretation:      Sinus rhythm Left bundle branch block LBBB   CT Head w/o contrast: IMPRESSION: 1. Stable  chronic ischemic changes.  No acute intracranial process. 2. Sinus disease.  CT Cervical Spine w/o contrast: IMPRESSION: 1. Multilevel cervical spondylosis greatest at C4-5 and C5-6. 2. No acute cervical spine fracture.  IMPRESSION: No active disease. Right-sided central venous port tip projects over right atrial IVC junction as before.   Micro Data:  Blood cx x 2 sent SARSCOV2 Pending  Antimicrobials:  None at time of evaluation   Interim history/subjective:  Seen by Neurology who increased patient's Depakote 500mg  BID for mgmt of partial seizure.  Objective   Blood pressure (!) 81/41, pulse (!) 47, temperature (!) 96.2 F (35.7 C), temperature source Rectal, resp. rate 13, SpO2 100 %.        Intake/Output Summary (Last 24 hours) at 12/18/2019 0118 Last data filed at 12/17/2019 2259 Gross per 24 hour  Intake 1100 ml  Output --  Net 1100 ml   There were no vitals filed for this visit.      Examination: General: in no acute distress HENT: normocephalic, sclera anicteric Lungs: clear to auscultation bilaterally  Cardiovascular: S1 and S2 appreciated decreased rate Abdomen: soft non obese with + BS Extremities: no gross deformities.  Neuro: moves all extremities after being awakened. Responds to noxious stimuli in all extremities Skin: warm dry and intact on gross examination   Assessment & Plan:  1. Acute on Chronic Encephalopathy Pt w/ well documented history of Dementia w/ behavioral disturbances At baseline she has significant memeory impairment and is dept on her husband for decision making.  Now with EEG demonstrating sharp waves--> possible partial seizure. Plan: Continue neurochecks Continue on Depakote 500 mg BID F/u Neurology recommendations  2. Brief instance of Hypotension In the setting of no food or drink for several hours Fluid responsive post IVF bolus Pt does not require vasopressors  No need for ICU mgmt Reviewed home meds: hold amlodipine,  diltiazem, sotalol, meclizine and trazodone at this time  3. Hyponatremia in the setting of hypovolemia Correct volume status and Na should improve   4. H/o CAD Pt on rosuvastatin 10 mg and ezetimibe on home meds Check Lipid profile  And restart when able  5. Chronic ITP Last clinic visit 06/17/2019 Per this documentation pt was on monthly IVIG with positive response to treatment  Strongly recommend reviewing patients home medications as she is at high risk for polypharmacy interactions.   For example: this pt is on Seroquel and Trazodone -> increased risk for serotonin syndrome  Best practice:  Diet: per primary Pain/Anxiety/Delirium protocol (if indicated): not indicated at this time VAP protocol (if indicated): not  indicated DVT prophylaxis: per primary GI prophylaxis: per primary Glucose control: per primary Mobility: bedrest with fall and seizure precautions recommended Code Status: Full Family Communication: Husband is NOK  Disposition: Admit to [place]  Labs   CBC: Recent Labs  Lab 12/17/19 1655  WBC 6.8  NEUTROABS 4.9  HGB 10.2*  HCT 31.4*  MCV 89.5  PLT 53*    Basic Metabolic Panel: Recent Labs  Lab 12/17/19 1655  NA 132*  K 4.6  CL 100  CO2 22  GLUCOSE 158*  BUN 26*  CREATININE 0.85  CALCIUM 8.6*   GFR: CrCl cannot be calculated (Unknown ideal weight.). Recent Labs  Lab 12/17/19 1646 12/17/19 1655  WBC  --  6.8  LATICACIDVEN 1.7  --     Liver Function Tests: Recent Labs  Lab 12/17/19 1655  AST 27  ALT 23  ALKPHOS 50  BILITOT 0.6  PROT 6.4*  ALBUMIN 3.3*   No results for input(s): LIPASE, AMYLASE in the last 168 hours. No results for input(s): AMMONIA in the last 168 hours.  ABG    Component Value Date/Time   HCO3 24.5 (H) 12/08/2007 1059   TCO2 26 12/08/2007 1059     Coagulation Profile: No results for input(s): INR, PROTIME in the last 168 hours.  Cardiac Enzymes: Recent Labs  Lab 12/17/19 1655  CKTOTAL 296*     HbA1C: No results found for: HGBA1C  CBG: Recent Labs  Lab 12/17/19 1642  GLUCAP 148*    Review of Systems:   Marland KitchenMarland KitchenReview of Systems  Unable to perform ROS: Dementia     Past Medical History  She,  has a past medical history of Bilateral hearing loss, CAD (coronary artery disease) (2003), CVA (cerebral vascular accident) ([place]) (2010), Fatigue, Gross hematuria (07/03/2015), Hemorrhagic stroke ([place]) (2010), Hiatal hernia, Hypercholesteremia, ITP (idiopathic thrombocytopenic purpura) (01/04/2016), Kidney stones, Osteoporosis, Pancreatitis (1970), Pancreatitis, Thrombocytopenia ([place]), and Unspecified deficiency anemia.   Surgical History    Past Surgical History:  Procedure Laterality Date  . APPENDECTOMY    . CARDIAC VALVE SURGERY  2010  . CATARACT EXTRACTION  2017   x 2  . COLONOSCOPY     [facility], Dr. Wynetta Emery  . COLONOSCOPY WITH PROPOFOL N/A 04/09/2013   Procedure: COLONOSCOPY WITH PROPOFOL;  Surgeon: Garlan Fair, MD;  Location: [facility] ENDOSCOPY;  Service: Endoscopy;  Laterality: N/A;  . CORONARY ARTERY BYPASS GRAFT  06/2009  . FLEXIBLE SIGMOIDOSCOPY N/A 08/03/2015   Procedure: FLEXIBLE SIGMOIDOSCOPY;  Surgeon: Garlan Fair, MD;  Location: [facility] ENDOSCOPY;  Service: Endoscopy;  Laterality: N/A;  . IR FLUORO GUIDE PORT INSERTION RIGHT  07/11/2017  . IR US GUIDE VASC ACCESS RIGHT  07/11/2017  . TUBAL LIGATION       Social History   reports that she has never smoked. She has never used smokeless tobacco. She reports that she does not drink alcohol or use drugs.   Family History   Her family history includes Cancer in her brother and sister; Cancer (age of onset: 38) in her mother; Dementia in her brother; Heart attack in her father; Memory loss in her sister; Stroke in her sister.   Allergies Allergies  Allergen Reactions  . Bee Venom Anaphylaxis  . Iodides Anaphylaxis  . Iodinated Diagnostic Agents Anaphylaxis  . Lyrica [Pregabalin]   . Codeine Other (See Comments)     Reaction: hallucinate  . Demerol [Meperidine] Hives and Other (See Comments)    REACTION: [place]  . Ambien [Zolpidem Tartrate] Other (See Comments)  Altered Mental Status  . Aspirin Other (See Comments)    Bleeding, aneurysm- when she was on plavix Bleeding, aneurysm- when she was on plavix  . Celebrex [Celecoxib] Other (See Comments)    Legs hurt  . Darvocet [Propoxyphene N-Acetaminophen] Other (See Comments)    Hallucinates  . Fosamax [Alendronate Sodium] Other (See Comments)    Legs hurt  . Lipitor [Atorvastatin] Other (See Comments)    Legs hurt  . Morphine And Related Other (See Comments)    Hallucinations  . Propoxyphene Other (See Comments)    Hallucinates  . Valium [Diazepam] Other (See Comments)    Altered Mental Status  . Iodine Hives    [place]     Home Medications  Prior to Admission medications   Medication Sig Start Date End Date Taking? Authorizing Provider  amLODipine (NORVASC) 5 MG tablet Take 1 tablet (5 mg total) by mouth daily. 09/29/16 12/17/19 Yes Belva Crome, MD  calcium-vitamin D (OSCAL WITH D) 500-200 MG-UNIT TABS tablet Take 1 tablet by mouth daily. 03/02/09  Yes [provider]  Cholecalciferol 25 MCG (1000 UT) capsule Take 1 capsule by mouth daily.  03/02/09  Yes [provider]  diltiazem (TIAZAC) 120 MG 24 hr capsule Take 120 mg by mouth daily. 07/23/09  Yes [provider]  divalproex (DEPAKOTE SPRINKLE) 125 MG capsule Take 125 mg by mouth in the morning and at bedtime. 07/31/19  Yes [provider]  EPINEPHrine (ADRENALIN) 30 MG/30ML SOLN injection Inject 1 mg as directed See admin instructions. As directed for emergency   Yes [provider]  escitalopram (LEXAPRO) 5 MG tablet Take 1 tablet (5 mg total) by mouth daily. 09/21/19  Yes Pollina, Gwenyth Allegra, MD  ezetimibe (ZETIA) 10 MG tablet Take 10 mg by mouth daily.   Yes [provider]  L-Lysine 500 MG TABS Take 500 mg by mouth daily as  needed.  03/02/09  Yes [provider]  lidocaine (LIDODERM) 5 % Place 1 patch onto the skin daily. Remove & Discard patch within 12 hours or as directed by MD 09/21/19  Yes Pollina, Gwenyth Allegra, MD  meclizine (ANTIVERT) 25 MG tablet Take 25 mg by mouth 3 (three) times daily as needed for dizziness.   Yes [provider]  Omega-3 Fatty Acids (FISH OIL) 1000 MG CAPS Take 1,000 mg by mouth in the morning.  03/02/09  Yes [provider]  omeprazole (PRILOSEC) 40 MG capsule Take 40 mg by mouth daily.   Yes [provider]  QUEtiapine (SEROQUEL) 50 MG tablet Take 1 tablet (50 mg total) by mouth 3 (three) times daily. 09/21/19  Yes Pollina, Gwenyth Allegra, MD  rosuvastatin (CRESTOR) 10 MG tablet Take 10 mg by mouth daily.   Yes [provider]  sotalol (BETAPACE) 160 MG tablet Take 320 mg by mouth 2 (two) times daily.   Yes [provider]  traZODone (DESYREL) 50 MG tablet Take 1 tablet (50 mg total) by mouth at bedtime. 09/21/19  Yes Pollina, Gwenyth Allegra, MD   STAFF NOTE  I, Dr Seward Carol have personally reviewed patient's available data, including medical history, events of note, physical examination and test results as part of my evaluation. I have discussed with other care providers such as pharmacist, RN and Neurology.  In addition,  I personally evaluated patient  This patient is not critically ill and does not meet criteria for admission/ management within a critical care setting. The reason for the consult was one instance of  low BP which resolved with IVF. She does not require the  high complexity decision making needed for patients who require ICU assessment and support, frequent evaluation and titration of therapies, application of advanced monitoring technologies and extensive interpretation of multiple databases.   PCCM consult Time devoted to patient care services described in this note is 38 Minutes. This time reflects time of care of  this signee Dr Seward Carol. This critical care time does not reflect procedure time, or teaching time or supervisory timebut could involve care discussion time   CC TIME: 38  minutes CODE STATUS: FULL DISPOSITION: Admit to [place]: Guarded   Dr. Seward Carol Pulmonary Critical Care Medicine  12/18/2019 1:56 AM    Critical care time: 38 mins

## 2019-12-18 NOTE — Progress Notes (Signed)
As I was placing the SCD's on patient became alert she began to ask questions about where she was I informed she was at [facility] her response was good that's a good hospital. She did not remember what happened to her and she requested to use the bed pan to void. Report was given to day shift RN. Arthor Captain LPN

## 2019-12-18 NOTE — Progress Notes (Signed)
Occupational Therapy Evaluation Patient Details Name: [name] MRN: [id number] DOB: August 23, 1933 Today's Date: 12/18/2019    History of Present Illness [name] is a 86 y.o. female with PMH of dementia with behavioral disturbances, ITP, CAD, hemorrhagic stroke who presented to ED by EMS after she had a witnessed collapse at home and unresponsiveness. CT head negative for acute findings. "EEG recorded evidence of an area of potential epileptogenicity in the right frontal temporal region. There was no seizure recorded on this study."   Clinical Impression   PTA pt lived with her husband, independent with mobility without an assistive device and requiring assist for bathing and dressing tasks. Pt very lethargic this date requiring multimodal cues throughout to open eyes as well as to initiate and sequence tasks. Pt tolerated sitting EOB 10+ min with initial mod assist, progressing to supervision with no UE support. Pt minimally engaged in grooming/hygiene tasks while seated EOB, requiring max hand over hand assist. Pt able to transfer to [facility] with min to mod assist x 2. Noted 0 instances of LOB, however pt unsteady on feet. Pt assisted back to bed and positioned for comfort. Drop in BP with positional changes (see readings below). Pt demonstrates decreased strength, endurance, balance, standing tolerance, activity tolerance, and safety awareness impacting ability to complete self-care and functional transfer tasks. Recommend skilled OT services to address above deficits in order to promote function and prevent further decline.   BP semi-reclined: 134/64mmHg. BP sitting: 132/78mmHg BP after transfer to commode: 103/30mmHg. Pt symptomatic reporting dizziness.     Follow Up Recommendations  Home health OT;Supervision/Assistance - 24 hour(Husband refusing SNF)    Equipment Recommendations  Tub/shower seat    Recommendations for Other Services       Precautions / Restrictions  Precautions Precautions: Fall;Other (comment) Precaution Comments: Monitor BP - orthostatic hypotension. Hx of dementia.  Restrictions Weight Bearing Restrictions: No      Mobility Bed Mobility Overal bed mobility: Needs Assistance Bed Mobility: Supine to Sit;Sit to Supine     Supine to sit: Max assist;+2 for physical assistance Sit to supine: Max assist;+2 for physical assistance   General bed mobility comments: Multimodal cues to initiate and sequence task. Max assist for trunk and BLEs.   Transfers Overall transfer level: Needs assistance Equipment used: 2 person hand held assist Transfers: Sit to/from [facility] Sit to Stand: Min assist;+2 safety/equipment Stand pivot transfers: Min assist;Mod assist;+2 safety/equipment       General transfer comment: Multimodal cues to initiate and sequence task.     Balance Overall balance assessment: Needs assistance Sitting-balance support: Feet supported Sitting balance-Leahy Scale: Fair Sitting balance - Comments: Pt tolerated sitting EOB 10+ min. Initially mod assist, progressing to supervision towards end. No UE support.  Postural control: Posterior lean Standing balance support: Bilateral upper extremity supported;During functional activity Standing balance-Leahy Scale: Poor Standing balance comment: reliant on external support                           ADL either performed or assessed with clinical judgement   ADL Overall ADL's : Needs assistance/impaired Eating/Feeding: Maximal assistance;Bed level   Grooming: Maximal assistance;Wash/dry face;Brushing hair;Sitting Grooming Details (indicate cue type and reason): While seated EOB. Perseverated on brushing only right side of head despite cues. Required hand over hand max assist to wash face. Upper Body Bathing: Maximal assistance;Sitting   Lower Body Bathing: Maximal assistance;Sit to/from stand   Upper Body Dressing :  Maximal  assistance;Sitting   Lower Body Dressing: Maximal assistance;Sit to/from stand   Toilet Transfer: Moderate assistance;Minimal assistance;+2 for physical assistance;BSC   Toileting- Clothing Manipulation and Hygiene: Maximal assistance;Sit to/from stand       Functional mobility during ADLs: Minimal assistance;Moderate assistance;+2 for physical assistance General ADL Comments: Pt tolerated sitting EOB 10+ min with initially mod assist, progressing to supervision. Pt able to transfer to/from [facility] with min to mod hand held assist x 2.      Vision         Perception     Praxis      Pertinent Vitals/Pain Pain Assessment: Faces Faces Pain Scale: No hurt     Hand Dominance (unknown)   Extremity/Trunk Assessment Upper Extremity Assessment Upper Extremity Assessment: Difficult to assess due to impaired cognition;Generalized weakness(Initially resistant to ROM. BUE shld flex ~90 degrees. )   Lower Extremity Assessment Lower Extremity Assessment: Defer to PT evaluation RLE Deficits / Details: At least 3/5 LLE Deficits / Details: At least 3/5       Communication Communication Communication: Other (comment)(Quiet, mumbled speech)   Cognition Arousal/Alertness: Lethargic Behavior During Therapy: Flat affect Overall Cognitive Status: History of cognitive impairments - at baseline                                 General Comments: Pt has a history of dementia. A&O x 2, initially able to state that she was in the hospital however at the end of session pt continually asked "Where am I?" Pt very lethargic this date requiring max multimodal cues to open eyes.    General Comments  No signs/symptoms of distress    Exercises     Shoulder Instructions      Home Living Family/patient expects to be discharged to:: Private residence Living Arrangements: Spouse/significant other Available Help at Discharge: Family;Available 24 hours/day Type of Home: House Home Access:  Stairs to enter [employer] of Steps: 3   Home Layout: Able to live on main level with bedroom/bathroom     Bathroom Shower/Tub: Walk-in shower         Home Equipment: None          Prior Functioning/Environment Level of Independence: Needs assistance  Gait / Transfers Assistance Needed: PLOF obtained from husband. Pt independent with mobility without an AD. Pt has had 1 fall in the last 6 months.  ADL's / Homemaking Assistance Needed: Per husband, pt requires assist for dressing and bathing.             OT Problem List: Decreased strength;Decreased activity tolerance;Impaired balance (sitting and/or standing);Decreased cognition;Decreased coordination;Decreased safety awareness;Decreased knowledge of use of DME or AE      OT Treatment/Interventions: Self-care/ADL training;Therapeutic exercise;Neuromuscular education;Energy conservation;DME and/or AE instruction;Therapeutic activities;Patient/family education;Balance training    OT Goals(Current goals can be found in the care plan section) Acute Rehab OT Goals Patient Stated Goal: Per husband, he would like to take pt home Time For Goal Achievement: 01/01/20 Potential to Achieve Goals: Good ADL Goals Pt Will Perform Grooming: sitting;with mod assist Pt Will Transfer to Toilet: ambulating;regular height toilet;with min assist Pt Will Perform Toileting - Clothing Manipulation and hygiene: with mod assist;sit to/from stand Additional ADL Goal #1: Pt to tolerate standing up to 5 min with min guard assist and VSS, in preparation for ADLs.  OT Frequency: Min 3X/week   Barriers to D/C:  Co-evaluation PT/OT/SLP Co-Evaluation/Treatment: Yes Reason for Co-Treatment: Complexity of the patient's impairments (multi-system involvement);Necessary to address cognition/behavior during functional activity;For patient/therapist safety;To address functional/ADL transfers PT goals addressed during session:  Mobility/safety with mobility OT goals addressed during session: ADL's and self-care;Strengthening/ROM      AM-PAC OT "6 Clicks" Daily Activity     Outcome Measure Help from another person eating meals?: A Lot Help from another person taking care of personal grooming?: A Lot Help from another person toileting, which includes using toliet, bedpan, or urinal?: A Lot Help from another person bathing (including washing, rinsing, drying)?: A Lot Help from another person to put on and taking off regular upper body clothing?: A Lot Help from another person to put on and taking off regular lower body clothing?: A Lot 6 Click Score: 12   End of Session Equipment Utilized During Treatment: Gait belt Nurse Communication: Mobility status  Activity Tolerance: Patient limited by fatigue;Patient limited by lethargy Patient left: in bed;with call bell/phone within reach;with bed alarm set  OT Visit Diagnosis: Unsteadiness on feet (R26.81);Other abnormalities of gait and mobility (R26.89);Muscle weakness (generalized) (M62.81);History of falling (Z91.81)                Time: [id number] OT Time Calculation (min): 30 min Charges:  OT General Charges $OT Visit: 1 Visit OT Evaluation $OT Eval Moderate Complexity: 1 [address] OTR/L [phone]  Mauri [place] 12/18/2019, 10:30 AM

## 2019-12-19 DIAGNOSIS — R4589 Other symptoms and signs involving emotional state: Secondary | ICD-10-CM

## 2019-12-19 DIAGNOSIS — Z66 Do not resuscitate: Secondary | ICD-10-CM

## 2019-12-19 DIAGNOSIS — Z7189 Other specified counseling: Secondary | ICD-10-CM

## 2019-12-19 DIAGNOSIS — R55 Syncope and collapse: Secondary | ICD-10-CM

## 2019-12-19 DIAGNOSIS — Z515 Encounter for palliative care: Secondary | ICD-10-CM

## 2019-12-19 DIAGNOSIS — R410 Disorientation, unspecified: Secondary | ICD-10-CM

## 2019-12-19 LAB — CBC WITH DIFFERENTIAL/PLATELET
Abs Immature Granulocytes: 0.02 10*3/uL (ref 0.00–0.07)
Basophils Absolute: 0 10*3/uL (ref 0.0–0.1)
Basophils Relative: 1 %
Eosinophils Absolute: 0.3 10*3/uL (ref 0.0–0.5)
Eosinophils Relative: 5 %
HCT: 35.2 % — ABNORMAL LOW (ref 36.0–46.0)
Hemoglobin: 11.1 g/dL — ABNORMAL LOW (ref 12.0–15.0)
Immature Granulocytes: 0 %
Lymphocytes Relative: 25 %
Lymphs Abs: 1.4 10*3/uL (ref 0.7–4.0)
MCH: 28.5 pg (ref 26.0–34.0)
MCHC: 31.5 g/dL (ref 30.0–36.0)
MCV: 90.3 fL (ref 80.0–100.0)
Monocytes Absolute: 1 10*3/uL (ref 0.1–1.0)
Monocytes Relative: 18 %
Neutro Abs: 2.9 10*3/uL (ref 1.7–7.7)
Neutrophils Relative %: 51 %
Platelets: 18 10*3/uL — CL (ref 150–400)
RBC: 3.9 MIL/uL (ref 3.87–5.11)
RDW: 14.3 % (ref 11.5–15.5)
WBC: 5.6 10*3/uL (ref 4.0–10.5)
nRBC: 0 % (ref 0.0–0.2)

## 2019-12-19 LAB — URINE CULTURE: Culture: NO GROWTH

## 2019-12-19 LAB — COMPREHENSIVE METABOLIC PANEL
ALT: 25 U/L (ref 0–44)
AST: 33 U/L (ref 15–41)
Albumin: 3.2 g/dL — ABNORMAL LOW (ref 3.5–5.0)
Alkaline Phosphatase: 49 U/L (ref 38–126)
Anion gap: 11 (ref 5–15)
BUN: 11 mg/dL (ref 8–23)
CO2: 27 mmol/L (ref 22–32)
Calcium: 9 mg/dL (ref 8.9–10.3)
Chloride: 103 mmol/L (ref 98–111)
Creatinine, Ser: 0.83 mg/dL (ref 0.44–1.00)
GFR calc Af Amer: >60 mL/min (ref >60)
GFR calc non Af Amer: >60 mL/min (ref >60)
Glucose, Bld: 94 mg/dL (ref 70–99)
Potassium: 3.8 mmol/L (ref 3.5–5.1)
Sodium: 141 mmol/L (ref 135–145)
Total Bilirubin: 0.2 mg/dL — ABNORMAL LOW (ref 0.3–1.2)
Total Protein: 6.2 g/dL — ABNORMAL LOW (ref 6.5–8.1)

## 2019-12-19 LAB — MAGNESIUM: Magnesium: 1.9 mg/dL (ref 1.7–2.4)

## 2019-12-19 LAB — CORTISOL: Cortisol, Plasma: 8 ug/dL

## 2019-12-19 LAB — CK: Total CK: 194 U/L (ref 38–234)

## 2019-12-19 MED ORDER — SODIUM CHLORIDE 0.9% FLUSH
10.0000 mL | Freq: Two times a day (BID) | INTRAVENOUS | Status: DC
  Administered 2019-12-19 – 2019-12-20 (×3): 10 mL

## 2019-12-19 MED ORDER — SODIUM CHLORIDE 0.9% FLUSH: 10.0000 mL | INTRAVENOUS | Status: DC | PRN

## 2019-12-19 NOTE — Progress Notes (Signed)
   Palliative Medicine Inpatient Follow Up Note  I met with [name] and [name] (case manager). We discussed [place]'s present living situation and the concerns on behalf of the medical teams regarding [name] ability to provide care for [name]. [name] shares that he has done the best he can, but if [name] is unable to walk that he will not be able to provide her care in their home. [name] himself appears to have his own health ailments and has an unsteady gait.   [name] is quite overwhelmed with emotion during our visit with one another in terms of his wife's progressive dementia. He believes that she tried to commit suicide through taking too many Depakote. He said that she has a history of depression, according to her valproic acid level (37) though this does not appear to be the case.   I reviewed again with [name] the stages of dementia. We talked about the progressive nature of alzheimer's and vascular dementias. I shared the subtle differences that exist between the two. He seemed to understand and appreciate these explanations.  We ended by agreeing to meet again tomorrow at 1330. The plan today will be to identify whether or not [name] would be accepted to a memory care convalescent home. If not then we will need to identify a safe alternative. This is a difficult situation as no family lives locally and [name] has no friends that he can rely on.  Offered education and therapeutic listening as forms of emotional support.   Time In: 1322 Time Out: 1358 Time Spent: 36 Greater than 50% of the time was spent in counseling and coordination of care ______________________________________________________________________________________ [place] Team Team Cell Phone: [phone] Please utilize secure chat with additional questions, if there is no response within 30 minutes please call the above phone number  Palliative Medicine Team  providers are available by phone from 7am to 7pm daily and can be reached through the team cell phone.  Should this patient require assistance outside of these hours, please call the patient's attending physician.

## 2019-12-19 NOTE — Progress Notes (Signed)
Physical Therapy Treatment Patient Details Name: [name] MRN: [id number] DOB: May 11, 1933 Today's Date: 12/19/2019    History of Present Illness [name] is a 86 y.o. female with PMH of dementia with behavioral disturbances, ITP, CAD, hemorrhagic stroke who presented to ED by EMS after she had a witnessed collapse at home and unresponsiveness. CT head negative for acute findings. "EEG recorded evidence of an area of potential epileptogenicity in the right frontal temporal region. There was no seizure recorded on this study."    PT Comments    Pt making good progress towards her physical therapy goals today, with improved alertness. Oriented to self only, follows one step commands consistently. Ambulating 300 feet via handheld assist for guidance and stability. BP 154/74 post mobility, pt denying dizziness. D/c plan remains appropriate.     Follow Up Recommendations  Home health PT;Supervision/Assistance - 24 hour     Equipment Recommendations  3in1 (PT);Wheelchair (measurements PT);Wheelchair cushion (measurements PT)    Recommendations for Other Services       Precautions / Restrictions Precautions Precautions: Fall Restrictions Weight Bearing Restrictions: No    Mobility  Bed Mobility Overal bed mobility: Needs Assistance Bed Mobility: Supine to Sit;Sit to Supine     Supine to sit: Min guard Sit to supine: Min assist   General bed mobility comments: Progressing to edge of bed without physical assist. MinA for LE negotiation back into bed  Transfers Overall transfer level: Needs assistance Equipment used: 1 person hand held assist Transfers: Sit to/from Stand Sit to Stand: Min assist         General transfer comment: Light minA to facilitate standing from edge of bed and toilet  Ambulation/Gait Ambulation/Gait assistance: Min assist Gait Distance (Feet): 300 Feet Assistive device: 1 person hand held assist Gait Pattern/deviations: Step-through  pattern;Decreased stride length Gait velocity: decreased   General Gait Details: Handheld assist for guidance and to steady   Stairs             Wheelchair Mobility    Modified Rankin (Stroke Patients Only)       Balance Overall balance assessment: Needs assistance Sitting-balance support: Feet supported Sitting balance-Leahy Scale: Good     Standing balance support: Single extremity supported;During functional activity Standing balance-Leahy Scale: Poor Standing balance comment: reliant on external support                            Cognition Arousal/Alertness: Awake/alert Behavior During Therapy: WFL for tasks assessed/performed Overall Cognitive Status: History of cognitive impairments - at baseline                                 General Comments: Pt more alert, bright today. Able to follow 1 step commands. Oriented to self only.      Exercises      General Comments        Pertinent Vitals/Pain Pain Assessment: Faces Faces Pain Scale: No hurt    Home Living                      Prior Function            PT Goals (current goals can now be found in the care plan section) Acute Rehab PT Goals Patient Stated Goal: Per husband, he would like to take pt home Potential to Achieve Goals: Fair Progress towards PT goals:  Progressing toward goals    Frequency    Min 3X/week      PT Plan Current plan remains appropriate    Co-evaluation              AM-PAC PT "6 Clicks" Mobility   Outcome Measure  Help needed turning from your back to your side while in a flat bed without using bedrails?: None Help needed moving from lying on your back to sitting on the side of a flat bed without using bedrails?: A Little Help needed moving to and from a bed to a chair (including a wheelchair)?: A Little Help needed standing up from a chair using your arms (e.g., wheelchair or bedside chair)?: A Little Help needed to  walk in hospital room?: A Little Help needed climbing 3-5 steps with a railing? : A Little 6 Click Score: 19    End of Session Equipment Utilized During Treatment: Gait belt Activity Tolerance: Patient tolerated treatment well Patient left: in bed;with bed alarm set;with call bell/phone within reach Nurse Communication: Mobility status PT Visit Diagnosis: Unsteadiness on feet (R26.81);History of falling (Z91.81);Muscle weakness (generalized) (M62.81);Difficulty in walking, not elsewhere classified (R26.2)     Time: [id number] PT Time Calculation (min) (ACUTE ONLY): 24 min  Charges:  $Therapeutic Activity: 23-37 mins                       Wyona Almas, PT, DPT Acute Rehabilitation Services Pager [phone] Office [phone]    Deno Etienne 12/19/2019, 2:42 PM

## 2019-12-19 NOTE — TOC Initial Note (Signed)
Transition of Care [facility]) - Initial/Assessment Note    Patient Details  Name: [name] MRN: [id number] Date of Birth: [birth year]  [facility] CM/SW Contact:    Pollie Friar, RN Phone Number: 12/19/2019, 3:38 PM  Clinical Narrative:                 CM and palliative care met with the patients spouse. He is unsure at this time of d/c destination. He has struggled with her at home and needs her to be ambulatory to return home.  Pt has been active with palliative care through [facility] prior to admission and could transition to hospice services at d/c if pt is able to return home.  Spouse also interested in potential rehab vs memory care. He is interested in [place]. CM has completed FL2 and faxed information to [facility] and will speak with their admissions coordinator. CM and palliative care to meet with spouse again tomorrow. TOC following.  Expected Discharge Plan: Home w Hospice Care Barriers to Discharge: Continued Medical Work up   Patient Goals and CMS Choice   CMS Medicare.gov Compare Post Acute Care list provided to:: Patient Represenative (must comment) Choice offered to / list presented to : Spouse  Expected Discharge Plan and Services Expected Discharge Plan: [place]   Discharge Planning Services: CM Consult Post Acute Care Choice: Hospice Living arrangements for the past 2 months: Single Family Home                                      Prior Living Arrangements/Services Living arrangements for the past 2 months: Single Family Home Lives with:: Spouse Patient language and need for interpreter reviewed:: Yes        Need for Family Participation in Patient Care: Yes (Comment) Care giver support system in place?: Yes (comment)(spouse)   Criminal Activity/Legal Involvement Pertinent to Current Situation/Hospitalization: No - Comment as needed  Activities of Daily Living      Permission Sought/Granted                  Emotional Assessment Appearance:: Appears stated age Attitude/Demeanor/Rapport: Engaged Affect (typically observed): Accepting Orientation: : Oriented to Self   Psych Involvement: No (comment)  Admission diagnosis:  Collapse [R55] Altered mental status, unspecified altered mental status type [R41.82] Patient Active Problem List   Diagnosis Date Noted  . Palliative care by specialist   . Goals of care, counseling/discussion   . DNR (do not resuscitate)   . Need for emotional support   . Hyponatremia 12/18/2019  . Acute encephalopathy 12/18/2019  . Labile blood pressure   . Altered mental status   . Collapse 12/17/2019  . Dementia with behavioral disturbance ([place]) 12/17/2019  . Deficiency anemia 05/20/2019  . Acute anemia 05/07/2019  . Recurrent vertigo 11/02/2018  . Seasonal allergies 07/12/2018  . Hives of unknown origin 03/13/2018  . Financial difficulties 01/16/2018  . Poor memory 11/17/2017  . Bleeding hemorrhoid 09/22/2017  . Port-A-Cath in place 08/25/2017  . Poor venous access 05/05/2017  . Chronic ITP (idiopathic thrombocytopenia) (HCC) 05/02/2017  . Hyperlipidemia LDL goal <70 09/29/2016  . Idiopathic thrombocytopenic purpura ([place]) 01/04/2016  . S/P mitral valve repair 04/26/2015  . Hypertrophic obstructive cardiomyopathy ([place]) 04/08/2014  . CAD (coronary artery disease) 04/08/2014  . Intracranial hemorrhage ([place]) 04/08/2014  . Paroxysmal atrial fibrillation ([place]) 04/08/2014  . Thrombocytopenia ([place])   .  Other fatigue   . Unspecified deficiency anemia    PCP:  [name], MD Pharmacy:   [id number] [place], [place] - [address]Phone: [phone]: [phone] [id number] [id number]: [phone]: [phone]   Social Determinants of Health (SDOH) Interventions     Readmission Risk Interventions No flowsheet data found.

## 2019-12-19 NOTE — Progress Notes (Signed)
PROGRESS NOTE  [name] [id number] DOB: August 17, 1933 DOA: 12/17/2019 PCP: [name], MD  Brief summary:  [name] is a 86 y.o. female with PMH of dementia with behavioral disturbances, ITP, CAD, hemorrhagic stroke who presented to ED by EMS after she had a witnessed collapse at home and unresponsive since that time.   Patient was brought to ER by EMS after her husband witnessed her collapse around 1430. He was unsure if she hit her head. She remained unresponsive since that time and he finally called EMS. Husband noted that patient with Divalproex bottle with lid off and unsure if she took any  In the ED: Vitals initially stable but patient became bradycardic and hypotensive. Improved with fluid boluses. Requested CCM consult and they recommended Stepdown unit as patient fluid responsive. Labs remarkable for: TSH/Ethanol Level/Tylenol level/Salicylate level/Lactate/Troponin WNL. UA without evidence of infection. Valproic acid level 37, WBC 6.8, Plt 53, CK 296, Na 132 otherwise CMP WNL. CXR, CT Head/C-Spine without acute findings. Neuro consulted and performed EEG and gave Keppra; Suspect partial seizure     HPI/Recap of past 24 hours:  She is awake this morning,  Platelet is low, no sign of bleeding, no petechiae, urine in canister is clear  Blood pressure stable, sinus bradycardia in the 50s on telemetry  Assessment/Plan: Principal Problem:   Collapse Active Problems:   Thrombocytopenia (HCC)   CAD (coronary artery disease)   Hyperlipidemia LDL goal <70   Chronic ITP (idiopathic thrombocytopenia) (HCC)   Port-A-Cath in place   Poor memory   Dementia with behavioral disturbance (HCC)   Hyponatremia   Acute encephalopathy   Altered mental status, in the setting of advanced dementia -Was reported unresponsive at home -TSH/Ethanol Level/Tylenol level/Salicylate level/Lactate/Troponin WNL, valproic acid level 37 -CT of the head no acute findings, no sign  of infection identified so far -neurology consulted, suspected partial seizure, EEG performed "This EEG recorded evidence of an area of potential epileptogenicity in the right frontal temporal region. There was no seizure recorded on this study. " -Depakote dose decreased due to concern causing thrombocytopenia, he is on low-dose Depacon and Keppra now -She did well with speech therapy she is now on a regular diet -Neurology input appreciated, will follow recommendation  Hypotension/hypothermia -No fever, no leukocytosis, lactic acid level unremarkable ,no source of infection identified -TSH unremarkable, am cortisol unremarkable -Fluids responsive - home blood pressure medication held on presentation, now her blood pressure start to trend up, stop IV hydration, gradually restart home BP medication, will start Norvasc for now, consider stop Cardizem due to  tendency of bradycardia  Hyponatremia -Sodium 132 on presentation, normalized with hydration   Thrombocytopenia, with history of ITP -Platelet this morning is 18.  No sign of bleeding -Case discussed with hematology oncology Dr. Alvy Bimler who knows the patient very well recommend observation, no indication for treatment unless bleeding or platelet less than 10.  Dr. Alvy Bimler recommend palliative care consult as well.  As treatment for ITP has been rather difficult due to patient advanced dementia and poor social support -Blood culture,urine culture in process to rule out infection ,currently no fever -Total bilirubin within normal limit, less likely hemolysis -Dr. Alvy Bimler input appreciated  Dementia with behavior disturbances  Failure to thrive: She is followed by community palliative care service since February 2021, she likely will benefit hospice service.  Palliative care input appreciated  DVT Prophylaxis: SCDs due to thrombocytopenia  Code Status: DNR  Family Communication: patient , will attempt to  call husband today   Disposition Plan:    Patient came from:            Home with husband                                                                                               Anticipated d/c place:  To be determined  Barriers to d/c OR conditions which need to be met to effect a safe d/c:  Thrombocytopenia, behavior issue in the setting of advanced dementia, goals of care discussion   Consultants:  Hematology oncology Dr. Alvy Bimler  Neurology  Critical care  Palliative care  Procedures:  None  Antibiotics:  None   Objective: BP (!) 162/56 (BP Location: Left Arm)   Pulse (!) 50   Temp 98.1 F (36.7 C) (Oral)   Resp 16   SpO2 97%   Intake/Output Summary (Last 24 hours) at 12/19/2019 1013 Last data filed at 12/19/2019 0600 Gross per 24 hour  Intake 777.73 ml  Output 800 ml  Net -22.27 ml   There were no vitals filed for this visit.  Exam: Patient is examined daily including today on 12/19/2019, exams remain the same as of yesterday except that has changed    General: She she is more alert and more interactive, very pleasantly demented, she only oriented to self  Cardiovascular: Sinus bradycardia  Respiratory: CTABL  Abdomen: Soft/ND/NT, positive BS  Musculoskeletal: No Edema  Neuro: Alert, pleasantly demented  Data Reviewed: Basic Metabolic Panel: Recent Labs  Lab 12/17/19 1655 12/18/19 0417 12/19/19 0426  NA 132* 139 141  K 4.6 4.2 3.8  CL 100 110 103  CO2 22 21* 27  GLUCOSE 158* 105* 94  BUN 26* 18 11  CREATININE 0.85 0.82 0.83  CALCIUM 8.6* 8.2* 9.0  MG  --   --  1.9   Liver Function Tests: Recent Labs  Lab 12/17/19 1655 12/18/19 0417 12/19/19 0426  AST 27 31 33  ALT '23 21 25  ' ALKPHOS 50 45 49  BILITOT 0.6 0.5 0.2*  PROT 6.4* 5.8* 6.2*  ALBUMIN 3.3* 2.9* 3.2*   No results for input(s): LIPASE, AMYLASE in the last 168 hours. No results for input(s): AMMONIA in the last 168 hours. CBC: Recent Labs  Lab 12/17/19 1655 12/18/19 0417 12/19/19  0426  WBC 6.8 7.4 5.6  NEUTROABS 4.9  --  2.9  HGB 10.2* 11.1* 11.1*  HCT 31.4* 34.6* 35.2*  MCV 89.5 90.8 90.3  PLT 53* 18* 18*   Cardiac Enzymes:   Recent Labs  Lab 12/17/19 1655 12/19/19 0426  CKTOTAL 296* 194   BNP (last 3 results) No results for input(s): BNP in the last 8760 hours.  ProBNP (last 3 results) No results for input(s): PROBNP in the last 8760 hours.  CBG: Recent Labs  Lab 12/17/19 1642  GLUCAP 148*    Recent Results (from the past 240 hour(s))  Urine culture     Status: None   Collection Time: 12/17/19  9:53 PM   Specimen: Urine, Clean Catch  Result Value Ref Range Status   Specimen Description URINE, CLEAN CATCH  Final   Special Requests NONE  Final   Culture   Final    NO GROWTH Performed at [facility], [place] [address], [place], [place] [address]    Report Status 12/19/2019 FINAL  Final  SARS CORONAVIRUS 2 (TAT 6-24 HRS) Nasopharyngeal Nasopharyngeal Swab     Status: None   Collection Time: 12/17/19 10:36 PM   Specimen: Nasopharyngeal Swab  Result Value Ref Range Status   SARS Coronavirus 2 NEGATIVE NEGATIVE Final    Comment: (NOTE) SARS-CoV-2 target nucleic acids are NOT DETECTED. The SARS-CoV-2 RNA is generally detectable in upper and lower respiratory specimens during the acute phase of infection. Negative results do not preclude SARS-CoV-2 infection, do not rule out co-infections with other pathogens, and should not be used as the sole basis for treatment or other patient management decisions. Negative results must be combined with clinical observations, patient history, and epidemiological information. The expected result is Negative. Fact Sheet for Patients: SugarRoll.be Fact Sheet for Healthcare Providers: https://www.woods-mathews.com/ This test is not yet approved or cleared by the [place] FDA and  has been authorized for detection and/or diagnosis of SARS-CoV-2 by FDA under  an Emergency Use Authorization (EUA). This EUA will remain  in effect (meaning this test can be used) for the duration of the COVID-19 declaration under Section 56 4(b)(1) of the Act, 21 U.S.C. section 360bbb-3(b)(1), unless the authorization is terminated or revoked sooner. Performed at [facility], [place] [address] [place], [place] [address]   Culture, blood (Routine X 2) w Reflex to ID Panel     Status: None (Preliminary result)   Collection Time: 12/18/19  1:42 AM   Specimen: BLOOD  Result Value Ref Range Status   Specimen Description BLOOD PORTA CATH CHEST  Final   Special Requests   Final    BOTTLES DRAWN AEROBIC AND ANAEROBIC Blood Culture adequate volume   Culture   Final    NO GROWTH 1 DAY Performed at [facility], [place] [address], [place], [place] [address]    Report Status PENDING  Incomplete  Culture, blood (Routine X 2) w Reflex to ID Panel     Status: None (Preliminary result)   Collection Time: 12/18/19  1:44 AM   Specimen: BLOOD  Result Value Ref Range Status   Specimen Description BLOOD RIGHT ARM  Final   Special Requests   Final    BOTTLES DRAWN AEROBIC AND ANAEROBIC Blood Culture adequate volume   Culture   Final    NO GROWTH 1 DAY Performed at [facility], [place] [address], [place], [place] [address]    Report Status PENDING  Incomplete     Studies: No results found.  Scheduled Meds: . Chlorhexidine Gluconate Cloth  6 each Topical Daily  . mouth rinse  15 mL Mouth Rinse q12n4p    Continuous Infusions: . levETIRAcetam 250 mg (12/19/19 0410)  . valproate sodium 125 mg (12/18/19 2102)     Time spent: 49mns, case discussed with palliative care I have personally reviewed and interpreted on  12/19/2019 daily labs, tele strips, imagings as discussed above under date review session and assessment and plans.  I reviewed all nursing notes, pharmacy notes, consultant notes,  vitals, pertinent old records  I have discussed plan of care as  described above with RN , patient  on 12/19/2019   FFlorencia ReasonsMD, PhD, FACP  [facility]  Available via Epic secure chat 7am-7pm for nonurgent issues Please page for  urgent issues, pager number available through [email or web address] .   12/19/2019, 10:13 AM  LOS: 2 days

## 2019-12-19 NOTE — Progress Notes (Signed)
[name]   DOB:1932-09-29   [id number]    ASSESSMENT & PLAN:  Chronic ITP She has not received treatment since last year From our previous visit, I have extensive discussions with her husband about goals of care She is not able to receive further IVIG treatment due to behavioral disturbances and recurrent hospitalization I do not recommend further treatment for her unless the family wants her to continue on treatment I do not recommend transfusion support to prolong her life  Severe dementia with behavioral disturbances She is not receiving any treatment right now  Goals of care Unable to discuss goals of care with her husband He is not picking up his phone Palliative care is following  Discharge planning We will defer to primary service Please call if questions arise  All questions were answered. The patient knows to call the clinic with any problems, questions or concerns.    Heath Lark, MD 12/19/2019 7:42 AM  Subjective:  I was notified by hospitalist about her admission. I have not seen her since end of last year She has been getting monthly IVIG for chronic ITP. She has not been receiving IVIG treatment for the last 6 months due to recurrent hospitalization secondary to dementia I have not been able to get in touch with her husband but previously, had extensive goals of care discussion about the role of IVIG treatment to prolong her life and to reduce the risk of bleeding. This morning, the patient is asleep.  I have reviewed her chart extensively and noted significant dementia with behavioral disturbances and inability to understand her situation and make informed decision about plan of care I have also been made aware that the primary care doctor has set up palliative care/hospice referral for the patient  Objective:  Vitals:   12/18/19 2332 12/19/19 0336  BP: (!) 145/67 (!) 162/56  Pulse: (!) 56 (!) 50  Resp: 17 16  Temp: 98.5 F (36.9 C) 98.1 F (36.7 C)   SpO2: 98% 97%     Intake/Output Summary (Last 24 hours) at 12/19/2019 0742 Last data filed at 12/19/2019 0600 Gross per 24 hour  Intake 777.73 ml  Output 800 ml  Net -22.27 ml    GENERAL she is asleep, I did not wake her up to examine her   Labs:  Recent Labs    12/17/19 1655 12/18/19 0417 12/19/19 0426  NA 132* 139 141  K 4.6 4.2 3.8  CL 100 110 103  CO2 22 21* 27  GLUCOSE 158* 105* 94  BUN 26* 18 11  CREATININE 0.85 0.82 0.83  CALCIUM 8.6* 8.2* 9.0  GFRNONAA >60 >60 >60  GFRAA >60 >60 >60  PROT 6.4* 5.8* 6.2*  ALBUMIN 3.3* 2.9* 3.2*  AST 27 31 33  ALT 23 21 25   ALKPHOS 50 45 49  BILITOT 0.6 0.5 0.2*    Studies:  CT Head Wo Contrast  Result Date: 12/17/2019 CLINICAL DATA:  [place], unresponsive, altered level of consciousness EXAM: CT HEAD WITHOUT CONTRAST TECHNIQUE: Contiguous axial images were obtained from the base of the skull through the vertex without intravenous contrast. COMPARISON:  09/21/2019 FINDINGS: Brain: Chronic ischemic changes are seen within the right temporal and left occipital lobes. Chronic small vessel ischemic changes are seen within the right frontal subcortical white matter. No signs of acute infarct or hemorrhage. Lateral ventricles and midline structures are otherwise unremarkable. No acute extra-axial fluid collections. No mass effect. Vascular: No hyperdense vessel or unexpected calcification. Skull: Normal. Negative for  fracture or focal lesion. Sinuses/Orbits: Chronic mucosal thickening of the bilateral maxillary sinuses, right greater than left. Mucoperiosteal thickening throughout the ethmoid air cells. Hypoplastic frontal sinuses. Other: None IMPRESSION: 1. Stable chronic ischemic changes.  No acute intracranial process. 2. Sinus disease as above. Electronically Signed   By: Randa Ngo M.D.   On: 12/17/2019 18:38   CT Cervical Spine Wo Contrast  Result Date: 12/17/2019 CLINICAL DATA:  [place], unresponsive, altered level of consciousness  EXAM: CT CERVICAL SPINE WITHOUT CONTRAST TECHNIQUE: Multidetector CT imaging of the cervical spine was performed without intravenous contrast. Multiplanar CT image reconstructions were also generated. COMPARISON:  None. FINDINGS: Alignment: Alignment is grossly anatomic. Skull base and vertebrae: No acute displaced cervical spine fractures. Soft tissues and spinal canal: No prevertebral fluid or swelling. No visible canal hematoma. Right chest wall port via internal jugular approach is identified. Disc levels: There is mild diffuse spondylosis greatest at C4-5 and C5-6. Mild facet hypertrophy at the cervicothoracic junction. No significant bony encroachment upon the central canal. Asymmetric right neural foraminal narrowing at C4-5 and C5-6. Upper chest: Airway is patent.  Lung apices are clear. Other: Reconstructed images demonstrate no additional findings. IMPRESSION: 1. Multilevel cervical spondylosis greatest at C4-5 and C5-6. 2. No acute cervical spine fracture. Electronically Signed   By: Randa Ngo M.D.   On: 12/17/2019 18:40   DG Chest Port 1 View  Result Date: 12/17/2019 CLINICAL DATA:  [place], unresponsive EXAM: PORTABLE CHEST 1 VIEW COMPARISON:  09/21/2019 FINDINGS: Post sternotomy changes. Right-sided central venous port tip over the right atrial IVC junction. Mitral annular calcification. No pleural effusion, focal airspace disease, or pneumothorax. Stable cardiomediastinal silhouette with aortic atherosclerosis. IMPRESSION: No active disease. Right-sided central venous port tip projects over right atrial IVC junction as before. Electronically Signed   By: Donavan Foil M.D.   On: 12/17/2019 17:36   Portable EEG  Result Date: 12/17/2019 Greta Doom, MD     12/17/2019 11:49 PM History: 86 year old female presenting with episode of unresponsiveness with increased tone Sedation: None Technique: This is a 21 channel routine scalp EEG performed at the bedside with bipolar and monopolar  montages arranged in accordance to the international 10/20 system of electrode placement. One channel was dedicated to EKG recording. Background: There is a posterior dominant rhythm seen at times, with a frequency of 8 to 9 Hz.  In addition, there is admixed generalized irregular delta and theta range activity which is relatively high voltage.  There are occasional sharp waves seen at F8 >T8 with variable spread to Fp2 and F4 and P8. Photic stimulation: Physiologic driving is not performed EEG Abnormalities: 1) right frontotemporal sharp waves 2) generalized irregular slow activity Clinical Interpretation: This EEG recorded evidence of an area of potential epileptogenicity in the right frontal temporal region. There was no seizure recorded on this study. Roland Rack, MD [facility] [phone] If 7pm- 7am, please page neurology on call as listed in [facility].

## 2019-12-19 NOTE — Evaluation (Signed)
Clinical/Bedside Swallow Evaluation Patient Details  Name: [name] MRN: [id number] Date of Birth: [birth year]  Today's Date: 12/19/2019 Time: SLP Start Time (ACUTE ONLY): 1054 SLP Stop Time (ACUTE ONLY): 1107 SLP Time Calculation (min) (ACUTE ONLY): 13 min  Past Medical History:  Past Medical History:  Diagnosis Date  . Bilateral hearing loss   . CAD (coronary artery disease) 2003   on Plavix.   . CVA (cerebral vascular accident) ([place]) 2010  . Fatigue   . Gross hematuria 07/03/2015  . Hemorrhagic stroke ([place]) 2010  . Hiatal hernia   . Hypercholesteremia   . ITP (idiopathic thrombocytopenic purpura) 01/04/2016  . Kidney stones   . Osteoporosis   . Pancreatitis 1970  . Pancreatitis   . Thrombocytopenia ([place])   . Unspecified deficiency anemia    Past Surgical History:  Past Surgical History:  Procedure Laterality Date  . APPENDECTOMY    . CARDIAC VALVE SURGERY  2010  . CATARACT EXTRACTION  2017   x 2  . COLONOSCOPY     [facility], Dr. Wynetta Emery  . COLONOSCOPY WITH PROPOFOL N/A 04/09/2013   Procedure: COLONOSCOPY WITH PROPOFOL;  Surgeon: Garlan Fair, MD;  Location: [facility] END[facility]COPY;  Service: Endoscopy;  Laterality: N/A;  . CORONARY ARTERY BYPASS GRAFT  06/2009  . FLEXIBLE SIGMOIDOSCOPY N/A 08/03/2015   Procedure: FLEXIBLE SIGMOIDOSCOPY;  Surgeon: Garlan Fair, MD;  Location: [facility]COPY;  Service: Endoscopy;  Laterality: N/A;  . IR FLUORO GUIDE PORT INSERTION RIGHT  07/11/2017  . IR US GUIDE VASC ACCESS RIGHT  07/11/2017  . TUBAL LIGATION     HPI:  [name] is a 86 y.o. female with PMH of dementia with behavioral disturbances, ITP, CAD, GERD, hemorrhagic stroke who presented to ED by EMS after she had a witnessed collapse at home.  Head CT reported: "1. Stable chronic ischemic changes.  No acute intracranial process."    Assessment / Plan / Recommendation Clinical Impression  Pt was seen for a bedside swallow evaluation and she presents with suspected  functional oropharyngeal swallowing abilities.  She was encountered asleep in bed, but she roused to minimal verbal stimulation and she was agreeable to this evaluation.  Pt was pleasantly confused, but she followed commands without difficulty.  Oral mechanism exam was WNL.  She consumed trials of thin liquid, puree, and regular solids.  Mastication was timely with only trace lingual residue noted that cleared with a liquid wash.  AP transport and swallow initiation appeared timely with all trials and hyolaryngeal elevation/excursion was consistently observed.  Recommend initiation of regular solids and thin liquids with medication administered whole in puree and full supervision to assist with self-feeding/to cue for compensatory strategies.  No further skilled ST is warranted at this time.  SLP will f/u per POC.    SLP Visit Diagnosis: Dysphagia, unspecified (R13.10)    Aspiration Risk  Mild aspiration risk    Diet Recommendation Regular;Thin liquid   Liquid Administration via: Cup;Straw Medication Administration: Whole meds with puree Supervision: Staff to assist with self feeding;Full supervision/cueing for compensatory strategies Compensations: Minimize environmental distractions;Slow rate;Small sips/bites Postural Changes: Seated upright at 90 degrees    Other  Recommendations Oral Care Recommendations: Oral care BID   Follow up Recommendations 24 hour supervision/assistance        Swallow Study   General Date of Onset: 12/19/19 HPI: [name] is a 86 y.o. female with PMH of dementia with behavioral disturbances, ITP, CAD, hemorrhagic stroke who presented to ED by  EMS after she had a witnessed collapse at home.  Head CT reported: "1. Stable chronic ischemic changes.  No acute intracranial process. Type of Study: Bedside Swallow Evaluation Previous Swallow Assessment: None Diet Prior to this Study: NPO Temperature Spikes Noted: No Respiratory Status: Room air History of  Recent Intubation: No Behavior/Cognition: Alert;Cooperative;Confused;Requires cueing Oral Cavity Assessment: Within Functional Limits Oral Care Completed by SLP: No Oral Cavity - Dentition: Dentures, top;Dentures, bottom Patient Positioning: Upright in bed Baseline Vocal Quality: Normal Volitional Cough: Cognitively unable to elicit Volitional Swallow: Unable to elicit    Oral/Motor/Sensory Function Overall Oral Motor/Sensory Function: Within functional limits   Ice Chips Ice chips: Not tested   Thin Liquid Thin Liquid: Within functional limits Presentation: Straw    Nectar Thick Nectar Thick Liquid: Not tested   Honey Thick Honey Thick Liquid: Not tested   Puree Puree: Within functional limits Presentation: Spoon   Solid     Solid: Within functional limits Presentation: [place]., M.S., [place] Office: [phone]  Elvia Collum Chanon Loney 12/19/2019,11:10 AM

## 2019-12-19 NOTE — Consult Note (Addendum)
Consultation Note Date: 12/19/2019   Patient Name: [name]  DOB: 22-May-1933  MRN: [id number]  Age / Sex: 86 y.o., female  PCP: [name], MD Referring Physician: Florencia Reasons, MD  Reason for Consultation: Goals of care discussion, Dr Calton Dach patient, per Dr. Alvy Bimler patient has advanced dementia with poor social support, has been very difficult in terms of treating ITP.  Patient at one point enrolled in hospice care.  HPI/Patient Profile:  Per [name] is a 86 y.o. female with PMH of dementia with behavioral disturbances, ITP, CAD, hemorrhagic stroke who presented to [name] by EMS after she had a witnessed collapse at home and unresponsive since that time.   Palliative care was asked to get involved to assist with goals of care. Per chart review that patient has been enrolled in [facility]" since December 2020. There does in [place] appear to be a DNR form from December 2020. There is also noted to be a Living Will from August 25th 1996.    Given patients persistent ITP and the inability to treat with IVIG d/t patients dementia she has a very poor prognosis.   Clinical Assessment and Goals of Care: I have reviewed medical records including EPIC notes, labs and imaging, received report from bedside RN, assessed the patient.    I called [name] to further discuss diagnosis prognosis, GOC, EOL wishes, disposition and options.   I introduced Palliative Medicine as specialized medical care for people living with serious illness. It focuses on providing relief from the symptoms and stress of a serious illness. The goal is to improve quality of life for both the patient and the family.  [name] shared that he is familiar with Palliative Care as she is presently enrolled in this through [facility]. He said that he has met with the  nursing staff a few times and they checked vital signs.   I asked [name] to tell me a little about [name]. He shares that she is from [place] originally. She and he had lived "many places" including [place], [place], and [place]. She went to college for 1.5 years though was unable to finish due to a lack of support. She has been married since 1958 and has a son and daughter.  In general he health has been poor for sometime and this has been trumped by her significant depression.  [name] shares that at home he does everything for [facility] including helping her dress, washing her, setting up he meals, feeding her her meals. He said that it has been hard on him. To provide all of this care. He verbalized that she does not eat very much unless he really "pushes her" and that she has poor hydration drinking less than two - three cups of water a day. Detailed discussion regarding the diagnosis of dementia and its natural trajectory.  Lost ability to communicate, ambulate,swallow and maintain continence.   A detailed discussion was had today regarding advanced  directives, miss Markson has a prior living will.  She also has a documented DNR on file.   Concepts specific to code status, artifical feeding and hydration, continued IV antibiotics and rehospitalization was had.  The difference between a aggressive medical intervention path  and a palliative comfort care path for this patient at this time was had. Per discussion with [name] she would not want aggressive measures at the end of life. She would want to allow god to take her naturally. [name] shares that she is a DNR.   Discussed with patient the importance of continued conversation with family and their  medical providers regarding overall plan of care and treatment options, ensuring decisions are within the context of the patients values and GOCs.  I spoke to [place] via telephone, they agree that at this point [place] seems more  appropriate for home hospice. If at any point in time [name] is unable to care for her she would then bed transitioned to residential hospice.   Decision Maker: [name] (Husband) [phone]   SUMMARY OF RECOMMENDATIONS   DNAR/DNI  TOC --> [facility] for transition to their in home hospice services  [place] to meet with [name] at 1330 today at which time will try to get a MOST filled out  Code Status/Advance Care Planning:  DNR  Symptom Management:  Dementia w/ behavioral disturbances Delirium Precautions  Palliative Prophylaxis:   Aspiration, Bowel Regimen, Delirium Protocol, Eye Care, Frequent Pain Assessment, Oral Care, Palliative Wound Care and Turn Reposition  Additional Recommendations (Limitations, Scope, Preferences):  Minimize Medications  Psycho-social/Spiritual:   Desire for further Chaplaincy support: YES  Additional Recommendations: Caregiving  Support/Resources and Education on Hospice  Prognosis:   < 6 months  Discharge Planning: Patient is appropriate to go home with hospice.     Primary Diagnoses: Present on Admission: . Collapse . Thrombocytopenia ([place]) . Chronic ITP (idiopathic thrombocytopenia) ([facility]) . Poor memory . Hyperlipidemia LDL goal <70  I have reviewed the medical record, interviewed the patient and family, and examined the patient. The following aspects are pertinent.  Past Medical History:  Diagnosis Date  . Bilateral hearing loss   . CAD (coronary artery disease) 2003   on Plavix.   . CVA (cerebral vascular accident) ([place]) 2010  . Fatigue   . Gross hematuria 07/03/2015  . Hemorrhagic stroke ([place]) 2010  . Hiatal hernia   . Hypercholesteremia   . ITP (idiopathic thrombocytopenic purpura) 01/04/2016  . Kidney stones   . Osteoporosis   . Pancreatitis 1970  . Pancreatitis   . Thrombocytopenia ([place])   . Unspecified deficiency anemia    Social History   Socioeconomic History  . Marital  status: Married    Spouse name: [name]  . Number of children: 2  . Years of education: College  . Highest education level: Not on file  Occupational History    Comment: retired Network engineer [employer]  Tobacco Use  . Smoking status: Never Smoker  . Smokeless tobacco: Never Used  Substance and Sexual Activity  . Alcohol use: No  . Drug use: No  . Sexual activity: Not on file  Other Topics Concern  . Not on file  Social History Narrative   Pt lives at home with spouse.   Caffeine Use: Rarely   Social Determinants of Health   Financial Resource Strain:   . Difficulty of Paying Living Expenses:   Food Insecurity:   . Worried About Charity fundraiser  in the Last Year:   . [place] in the Last Year:   Transportation Needs:   . Film/video editor (Medical):   Marland Kitchen Lack of Transportation (Non-Medical):   Physical Activity:   . Days of Exercise per Week:   . Minutes of Exercise per Session:   Stress:   . Feeling of Stress :   Social Connections:   . Frequency of Communication with Friends and Family:   . Frequency of Social Gatherings with Friends and Family:   . Attends Religious Services:   . Active Member of Clubs or Organizations:   . Attends Archivist Meetings:   Marland Kitchen Marital Status:    Family History  Problem Relation Age of Onset  . Cancer Mother 38       colon  . Heart attack Father   . Cancer Sister        colon, cervical  . Stroke Sister   . Memory loss Sister   . Cancer Brother        lung  . Dementia Brother    Scheduled Meds: . Chlorhexidine Gluconate Cloth  6 each Topical Daily  . mouth rinse  15 mL Mouth Rinse q12n4p   Continuous Infusions: . lactated ringers 75 mL/hr at 12/18/19 1313  . levETIRAcetam 250 mg (12/19/19 0410)  . valproate sodium 125 mg (12/18/19 2102)   PRN Meds:.diphenhydrAMINE Medications Prior to Admission:  Prior to Admission medications   Medication Sig Start Date End Date Taking? Authorizing Provider  amLODipine  (NORVASC) 5 MG tablet Take 1 tablet (5 mg total) by mouth daily. 09/29/16 12/17/19 Yes Belva Crome, MD  calcium-vitamin D (OSCAL WITH D) 500-200 MG-UNIT TABS tablet Take 1 tablet by mouth daily. 03/02/09  Yes [provider]  Cholecalciferol 25 MCG (1000 UT) capsule Take 1 capsule by mouth daily.  03/02/09  Yes [provider]  diltiazem (TIAZAC) 120 MG 24 hr capsule Take 120 mg by mouth daily. 07/23/09  Yes [provider]  divalproex (DEPAKOTE SPRINKLE) 125 MG capsule Take 125 mg by mouth in the morning and at bedtime. 07/31/19  Yes [provider]  EPINEPHrine (ADRENALIN) 30 MG/30ML SOLN injection Inject 1 mg as directed See admin instructions. As directed for emergency   Yes [provider]  escitalopram (LEXAPRO) 5 MG tablet Take 1 tablet (5 mg total) by mouth daily. 09/21/19  Yes Pollina, Gwenyth Allegra, MD  ezetimibe (ZETIA) 10 MG tablet Take 10 mg by mouth daily.   Yes [provider]  L-Lysine 500 MG TABS Take 500 mg by mouth daily as needed.  03/02/09  Yes [provider]  lidocaine (LIDODERM) 5 % Place 1 patch onto the skin daily. Remove & Discard patch within 12 hours or as directed by MD 09/21/19  Yes Pollina, Gwenyth Allegra, MD  meclizine (ANTIVERT) 25 MG tablet Take 25 mg by mouth 3 (three) times daily as needed for dizziness.   Yes [provider]  Omega-3 Fatty Acids (FISH OIL) 1000 MG CAPS Take 1,000 mg by mouth in the morning.  03/02/09  Yes [provider]  omeprazole (PRILOSEC) 40 MG capsule Take 40 mg by mouth daily.   Yes [provider]  QUEtiapine (SEROQUEL) 50 MG tablet Take 1 tablet (50 mg total) by mouth 3 (three) times daily. 09/21/19  Yes Pollina, Gwenyth Allegra, MD  rosuvastatin (CRESTOR) 10 MG tablet Take 10 mg by mouth daily.   Yes [provider]  sotalol (BETAPACE) 160 MG  tablet Take 320 mg by mouth 2 (two) times daily.   Yes [provider]  traZODone (DESYREL) 50 MG  tablet Take 1 tablet (50 mg total) by mouth at bedtime. 09/21/19  Yes Pollina, Gwenyth Allegra, MD   Allergies  Allergen Reactions  . Bee Venom Anaphylaxis  . Iodides Anaphylaxis  . Iodinated Diagnostic Agents Anaphylaxis  . Lyrica [Pregabalin]   . Codeine Other (See Comments)    Reaction: hallucinate  . Demerol [Meperidine] Hives and Other (See Comments)    REACTION: Welts  . Ambien [Zolpidem Tartrate] Other (See Comments)    Altered Mental Status  . Aspirin Other (See Comments)    Bleeding, aneurysm- when she was on plavix Bleeding, aneurysm- when she was on plavix  . Celebrex [Celecoxib] Other (See Comments)    Legs hurt  . Darvocet [Propoxyphene N-Acetaminophen] Other (See Comments)    Hallucinates  . Fosamax [Alendronate Sodium] Other (See Comments)    Legs hurt  . Lipitor [Atorvastatin] Other (See Comments)    Legs hurt  . Morphine And Related Other (See Comments)    Hallucinations  . Propoxyphene Other (See Comments)    Hallucinates  . Valium [Diazepam] Other (See Comments)    Altered Mental Status  . Iodine Hives    Welts   Review of Systems  Unable to perform ROS: Dementia   Physical Exam Vitals and nursing note reviewed.  Constitutional:      Appearance: She is ill-appearing.  HENT:     Head: Normocephalic.     Nose: Nose normal.     Mouth/Throat:     Mouth: Mucous membranes are dry.  Eyes:     Pupils: Pupils are equal, round, and reactive to light.  Cardiovascular:     Rate and Rhythm: Normal rate and regular rhythm.  Pulmonary:     Effort: Pulmonary effort is normal.  Abdominal:     Palpations: Abdomen is soft.  Musculoskeletal:        General: Normal range of motion.     Cervical back: Normal range of motion.  Skin:    General: Skin is warm and dry.     Capillary Refill: Capillary refill takes less than 2 seconds.     Coloration: Skin is pale.  Neurological:     General: No focal deficit present.  Psychiatric:        Mood and Affect: Mood  normal.    Vital Signs: BP (!) 162/56 (BP Location: Left Arm)   Pulse (!) 50   Temp 98.1 F (36.7 C) (Oral)   Resp 16   SpO2 97%  Pain Scale: 0-10   Pain Score: 0-No pain   SpO2: SpO2: 97 % O2 Device:SpO2: 97 % O2 Flow Rate: .   IO: Intake/output summary:   Intake/Output Summary (Last 24 hours) at 12/19/2019 0657 Last data filed at 12/19/2019 0600 Gross per 24 hour  Intake 777.73 ml  Output 800 ml  Net -22.27 ml   LBM:   Baseline Weight:   Most recent weight:       Palliative Assessment/Data: 20%  Time In: 0920 Time Out: 1030 Time Total: 70 Greater than 50%  of this time was spent counseling and coordinating care related to the above assessment and plan.  Signed by: Rosezella Rumpf, NP   Please contact Palliative Medicine Team phone at [phone] for questions and concerns.  For individual provider: See Shea Evans

## 2019-12-19 NOTE — ACP (Advance Care Planning) (Signed)
    Palliative Medicine Inpatient Consult   ADVANCED CARE PLANNING NOTE        Meeting Participants  Jeselle Kosakowski (Husband)        Discussion:  A detailed discussion was had today regarding advanced directives, [name] has a prior living will. She also has a documented DNR on file.   Concepts specific to code status, artifical feeding and hydration, continued IV antibiotics and rehospitalization was had. The difference between a aggressive medical intervention path and a palliative comfort care path for this patient at this time was had. Per discussion with [name] she would not want aggressive measures at the end of life. She would want to allow god to take her naturally. [name] shares that she is a DNR.   Discussed with patient the importance of continued conversation with family and their  medical providers regarding overall plan of care and treatment options, ensuring decisions are within the context of the patients values and GOCs.  I spoke to [place] via telephone, they agree that at this point [name] seems more appropriate for home hospice. If at any point in time [name] is unable to care for her she would then bed transitioned to residential hospice.       Advance Care Wishes  -Proxy Health Care Decision Maker (if needed): [name] (husband) -Advance Directives: Living will on file -Life limiting illness:  ITP, dementia -Estimated Prognosis: < 6 months -Code Status:  DNAR/DNI      Recommendations and Plan  -DNAR/ DNI  - Information to get patient enrolled in hospice care

## 2019-12-19 NOTE — Progress Notes (Addendum)
Subjective: No acute events overnight.  Husband at bedside states patient is doing well, more conversant today, at baseline.  States he was concerned that patient overdosed on Depakote.  However explained that patient's Depakote level was subtherapeutic when she arrived therefore it is less likely.  ROS: negative except above  Examination  Vital signs in last 24 hours: Temp:  [98.1 F (36.7 C)-98.5 F (36.9 C)] 98.2 F (36.8 C) (04/01 0830) Pulse Rate:  [50-61] 53 (04/01 0830) Resp:  [14-17] 16 (04/01 0336) BP: (116-162)/(56-67) 139/59 (04/01 0830) SpO2:  [97 %-99 %] 98 % (04/01 0830)  General: lying in bed, not in apparent distress CVS: pulse-normal rate and rhythm RS: breathing comfortably Extremities: normal, warm  Neuro: MS: Alert, oriented to self, knows she is in hospital only when provided with options, follows commands CN: pupils equal and reactive,  EOMI, face symmetric, tongue midline, normal sensation over face Motor:  Antigravity strength in all 4 extremities   Basic Metabolic Panel: Recent Labs  Lab 12/17/19 1655 12/18/19 0417 12/19/19 0426  NA 132* 139 141  K 4.6 4.2 3.8  CL 100 110 103  CO2 22 21* 27  GLUCOSE 158* 105* 94  BUN 26* 18 11  CREATININE 0.85 0.82 0.83  CALCIUM 8.6* 8.2* 9.0  MG  --   --  1.9    CBC: Recent Labs  Lab 12/17/19 1655 12/18/19 0417 12/19/19 0426  WBC 6.8 7.4 5.6  NEUTROABS 4.9  --  2.9  HGB 10.2* 11.1* 11.1*  HCT 31.4* 34.6* 35.2*  MCV 89.5 90.8 90.3  PLT 53* 18* 18*     Coagulation Studies: No results for input(s): LABPROT, INR in the last 72 hours.  Imaging No new brain imaging.  ASSESSMENT AND PLAN: 86 year old female presented with episode of unresponsiveness and stiffening in the setting of dementia.  EEG showed sharp waves in right frontotemporal region.  Epilepsy Dementia Chronic strokes Hyponatremia (resolved) Hyperglycemia Elevated BUN (resolved) Hypoalbuminemia Hypocalcemia Elevated  CK Idiopathic thrombocytopenic purpura -Patient's platelets dropped from 53 to 18 which could be secondary to valproic acid.   Recommendations -Ideally I would have preferred an antiepileptic with mood stabilizing properties.  However, given patient's dementia and multiple comorbidities, started with Keppra 250 mg twice daily for seizures.  -Continue valproic acid 125 mg twice daily (home dose) for now.  If platelets drop any further, recommend stopping valproic acid completely. -  If patient experiences irritability or worsening behavior, can switch to oxcarbazepine or lamotrigine. -Discussed seizure precautions with husband at bedside -As needed IV Ativan 2 mg only for generalized tonic-clonic seizure lasting more than 2 minutes while inpatient -Follow-up with neurology in 8 to 12 weeks -Management of other comorbidities per primary team  Thank you for allowing [place] to participate in the care of this patient.  Neurology will peripherally follow.  Please page neuro hospitalist for any further questions after 5 PM.  I have spent a total of25  minuteswith the patient reviewing hospitalnotes,  test results, labs and examining the patient as well as establishing an assessment and plan that was discussed personally with the patient and her team.>50% of time was spent in direct patient care.  Bayden Gil [name]

## 2019-12-19 NOTE — NC FL2 (Signed)
[place] MEDICAID FL2 LEVEL OF CARE SCREENING TOOL     IDENTIFICATION  Patient Name: [name] Birthdate: October 16, 1932 Sex: female Admission Date (Current Location): 12/17/2019  [facility] and [place] Number:  [employer] and Address:  [facility], [place] [address] [place], [place] [address]      Provider Number: [id number]  Attending Physician Name and Address:  Florencia Reasons, MD  Relative Name and Phone Number:       Current Level of Care: Hospital Recommended Level of Care: [place] Prior Approval Number:    Date Approved/Denied:   PASRR Number: [id number]  Discharge Plan: SNF    Current Diagnoses: Patient Active Problem List   Diagnosis Date Noted  . Palliative care by specialist   . Goals of care, counseling/discussion   . DNR (do not resuscitate)   . Need for emotional support   . Hyponatremia 12/18/2019  . Acute encephalopathy 12/18/2019  . Labile blood pressure   . Altered mental status   . Collapse 12/17/2019  . Dementia with behavioral disturbance ([place]) 12/17/2019  . Deficiency anemia 05/20/2019  . Acute anemia 05/07/2019  . Recurrent vertigo 11/02/2018  . Seasonal allergies 07/12/2018  . Hives of unknown origin 03/13/2018  . Financial difficulties 01/16/2018  . Poor memory 11/17/2017  . Bleeding hemorrhoid 09/22/2017  . Port-A-Cath in place 08/25/2017  . Poor venous access 05/05/2017  . Chronic ITP (idiopathic thrombocytopenia) (HCC) 05/02/2017  . Hyperlipidemia LDL goal <70 09/29/2016  . Idiopathic thrombocytopenic purpura ([place]) 01/04/2016  . S/P mitral valve repair 04/26/2015  . Hypertrophic obstructive cardiomyopathy ([place]) 04/08/2014  . CAD (coronary artery disease) 04/08/2014  . Intracranial hemorrhage ([place]) 04/08/2014  . Paroxysmal atrial fibrillation ([place]) 04/08/2014  . Thrombocytopenia ([place])   . Other fatigue   . Unspecified deficiency anemia     Orientation RESPIRATION BLADDER Height &  Weight     Self  Normal Incontinent Weight:   Height:     BEHAVIORAL SYMPTOMS/MOOD NEUROLOGICAL BOWEL NUTRITION STATUS    Convulsions/Seizures(new partial seizures--depakote and keppra) Continent Diet(regular with thin liquids)  AMBULATORY STATUS COMMUNICATION OF NEEDS Skin   Limited Assist Verbally Other (Comment)(MASD to lt breast)                       Personal Care Assistance Level of Assistance  Bathing, Feeding, Dressing Bathing Assistance: Limited assistance Feeding assistance: Limited assistance Dressing Assistance: Limited assistance     Functional Limitations Info  Sight, Hearing, Speech Sight Info: Adequate Hearing Info: Adequate Speech Info: Adequate    SPECIAL CARE FACTORS FREQUENCY  PT (By licensed PT), OT (By licensed OT)     PT Frequency: 5x/wk OT Frequency: 5x/wk            Contractures Contractures Info: Not present    Additional Factors Info  Code Status, Allergies Code Status Info: DNR Allergies Info: Bee Venom, Iodides, Iodinated Diagnostic Agents, Lyrica, Codeine, Demerol, Ambien, Aspirin, Celebrex, Darvocet, Fosamax, Lipitor, Morphine And Related Propoxyphene, Valium, Iodine           Current Medications (12/19/2019):  This is the current hospital active medication list Current Facility-Administered Medications  Medication Dose Route Frequency Provider Last Rate Last Admin  . Chlorhexidine Gluconate Cloth 2 % PADS 6 each  6 each Topical Daily Chauncey Mann, MD   6 each at 12/18/19 1223  . diphenhydrAMINE (BENADRYL) injection 12.5 mg  12.5 mg Intravenous Q8H PRN Florencia Reasons, MD   12.5  mg at 12/18/19 1748  . levETIRAcetam (KEPPRA) 250 mg in sodium chloride 0.9 % 100 mL IVPB  250 mg Intravenous Q12H Lora Havens, MD 410 mL/hr at 12/19/19 0410 250 mg at 12/19/19 0410  . MEDLINE mouth rinse  15 mL Mouth Rinse q12n4p Florencia Reasons, MD   15 mL at 12/19/19 1146  . sodium chloride flush (NS) 0.9 % injection 10-40 mL  10-40 mL Intracatheter Q12H Florencia Reasons, MD      . sodium chloride flush (NS) 0.9 % injection 10-40 mL  10-40 mL Intracatheter PRN Florencia Reasons, MD      . valproate (DEPACON) 125 mg in dextrose 5 % 50 mL IVPB  125 mg Intravenous Q12H Lora Havens, MD 51.3 mL/hr at 12/19/19 1021 125 mg at 12/19/19 1021   Facility-Administered Medications Ordered in Other Encounters  Medication Dose Route Frequency Provider Last Rate Last Admin  . sodium chloride flush (NS) 0.9 % injection 10 mL  10 mL Intravenous PRN Heath Lark, MD   10 mL at 09/22/17 1322     Discharge Medications: Please see discharge summary for a list of discharge medications.  Relevant Imaging Results:  Relevant Lab Results:   Additional Information SS#: [id number]  Pollie Friar, RN

## 2019-12-20 DIAGNOSIS — R4589 Other symptoms and signs involving emotional state: Secondary | ICD-10-CM

## 2019-12-20 LAB — CBC WITH DIFFERENTIAL/PLATELET
Abs Immature Granulocytes: 0.01 10*3/uL (ref 0.00–0.07)
Basophils Absolute: 0 10*3/uL (ref 0.0–0.1)
Basophils Relative: 1 %
Eosinophils Absolute: 0.2 10*3/uL (ref 0.0–0.5)
Eosinophils Relative: 4 %
HCT: 33.1 % — ABNORMAL LOW (ref 36.0–46.0)
Hemoglobin: 10.6 g/dL — ABNORMAL LOW (ref 12.0–15.0)
Immature Granulocytes: 0 %
Lymphocytes Relative: 35 %
Lymphs Abs: 1.9 10*3/uL (ref 0.7–4.0)
MCH: 28.5 pg (ref 26.0–34.0)
MCHC: 32 g/dL (ref 30.0–36.0)
MCV: 89 fL (ref 80.0–100.0)
Monocytes Absolute: 1 10*3/uL (ref 0.1–1.0)
Monocytes Relative: 18 %
Neutro Abs: 2.3 10*3/uL (ref 1.7–7.7)
Neutrophils Relative %: 42 %
Platelets: 18 10*3/uL — CL (ref 150–400)
RBC: 3.72 MIL/uL — ABNORMAL LOW (ref 3.87–5.11)
RDW: 14.1 % (ref 11.5–15.5)
WBC: 5.4 10*3/uL (ref 4.0–10.5)
nRBC: 0 % (ref 0.0–0.2)

## 2019-12-20 LAB — BASIC METABOLIC PANEL
Anion gap: 9 (ref 5–15)
BUN: 23 mg/dL (ref 8–23)
CO2: 26 mmol/L (ref 22–32)
Calcium: 8.8 mg/dL — ABNORMAL LOW (ref 8.9–10.3)
Chloride: 103 mmol/L (ref 98–111)
Creatinine, Ser: 0.98 mg/dL (ref 0.44–1.00)
GFR calc Af Amer: >60 mL/min (ref >60)
GFR calc non Af Amer: 53 mL/min — ABNORMAL LOW (ref >60)
Glucose, Bld: 122 mg/dL — ABNORMAL HIGH (ref 70–99)
Potassium: 3.6 mmol/L (ref 3.5–5.1)
Sodium: 138 mmol/L (ref 135–145)

## 2019-12-20 LAB — MAGNESIUM: Magnesium: 2 mg/dL (ref 1.7–2.4)

## 2019-12-20 MED ORDER — OLANZAPINE 2.5 MG PO TABS: 2.5000 mg | ORAL_TABLET | Freq: Every day | ORAL | Status: DC

## 2019-12-20 MED ORDER — DIVALPROEX SODIUM 125 MG PO CSDR
125.0000 mg | DELAYED_RELEASE_CAPSULE | Freq: Two times a day (BID) | ORAL | Status: DC
  Administered 2019-12-20 – 2019-12-21 (×3): 125 mg via ORAL
  Filled 2019-12-20 (×3): qty 1

## 2019-12-20 MED ORDER — SOTALOL HCL 80 MG PO TABS
80.0000 mg | ORAL_TABLET | Freq: Every day | ORAL | Status: DC
  Administered 2019-12-20 – 2019-12-21 (×2): 80 mg via ORAL
  Filled 2019-12-20 (×2): qty 1

## 2019-12-20 MED ORDER — HEPARIN SOD (PORK) LOCK FLUSH 100 UNIT/ML IV SOLN
500.0000 [IU] | INTRAVENOUS | Status: DC | PRN
  Administered 2019-12-20: 500 [IU]
  Filled 2019-12-20 (×2): qty 5

## 2019-12-20 MED ORDER — POTASSIUM CHLORIDE CRYS ER 20 MEQ PO TBCR
40.0000 meq | EXTENDED_RELEASE_TABLET | Freq: Once | ORAL | Status: AC
  Administered 2019-12-20: 40 meq via ORAL
  Filled 2019-12-20: qty 2

## 2019-12-20 MED ORDER — LEVETIRACETAM 250 MG PO TABS
250.0000 mg | ORAL_TABLET | Freq: Two times a day (BID) | ORAL | Status: DC
  Administered 2019-12-20 – 2019-12-21 (×3): 250 mg via ORAL
  Filled 2019-12-20 (×3): qty 1

## 2019-12-20 MED ORDER — QUETIAPINE FUMARATE 25 MG PO TABS
25.0000 mg | ORAL_TABLET | Freq: Every day | ORAL | Status: DC
  Administered 2019-12-20: 25 mg via ORAL
  Filled 2019-12-20: qty 1

## 2019-12-20 MED ORDER — HEPARIN SOD (PORK) LOCK FLUSH 100 UNIT/ML IV SOLN
500.0000 [IU] | INTRAVENOUS | Status: DC | PRN
  Filled 2019-12-20: qty 5

## 2019-12-20 MED ORDER — OLANZAPINE 2.5 MG PO TABS
2.5000 mg | ORAL_TABLET | Freq: Once | ORAL | Status: AC
  Administered 2019-12-20: 2.5 mg via ORAL
  Filled 2019-12-20: qty 1

## 2019-12-20 MED ORDER — HALOPERIDOL LACTATE 5 MG/ML IJ SOLN
0.5000 mg | Freq: Four times a day (QID) | INTRAMUSCULAR | Status: DC | PRN
  Administered 2019-12-20: 0.5 mg via INTRAVENOUS
  Filled 2019-12-20: qty 1

## 2019-12-20 MED ORDER — HEPARIN SOD (PORK) LOCK FLUSH 100 UNIT/ML IV SOLN
500.0000 [IU] | INTRAVENOUS | Status: DC
  Filled 2019-12-20: qty 5

## 2019-12-20 NOTE — Plan of Care (Signed)
Platelet level stable at 18.  Transitioned IV Depakote and Keppra to p.o. as patient is able to swallow medications now.  Neurology will sign off.  Please call neurology for any further questions.   Dijon Cosens [name]

## 2019-12-20 NOTE — TOC Progression Note (Signed)
Transition of Care [facility]) - Progression Note    Patient Details  Name: [name] MRN: [id number] Date of Birth: [birth year]  [facility] CM/SW Contact  Pollie Friar, RN Phone Number: 12/20/2019, 2:31 PM  Clinical Narrative:    CM met with palliative and spouse again. Pt worked with PT so the spouse could see her progress. She walked 300 feet yesterday. Pt will not qualify for SNF rehab under her [employer]. Spouse aware and decision made for home with hospice. Pt already active with [facility] through [place] and spouse wants to use their hospice services. CM called and updated Nunzio Cory with [place]. CM also asked for lock box for her meds at home and hospice will provide this.  Plan is for d/c home tomorrow. TOC following for further d/c needs.    Expected Discharge Plan: Home w Hospice Care Barriers to Discharge: Continued Medical Work up  Expected Discharge Plan and Services Expected Discharge Plan: [place]   Discharge Planning Services: CM Consult Post Acute Care Choice: Hospice Living arrangements for the past 2 months: Single Family Home                                       Social Determinants of Health (SDOH) Interventions    Readmission Risk Interventions No flowsheet data found.

## 2019-12-20 NOTE — Progress Notes (Signed)
Pt very irritable and agitated during shift. Pt keeps on trying to get oob; fall safety precaution implemented. Pt re-educated and re-directed but no changes. Prn medications adm ineffective. MD paged and notified and new order received. Will continue to closely monitor pt. Adrienne Heady RN

## 2019-12-20 NOTE — Progress Notes (Signed)
Physical Therapy Treatment Patient Details Name: [name] MRN: [id number] DOB: [birth year] Today's Date: 12/20/2019    History of Present Illness [name] is a 86 y.o. female with PMH of dementia with behavioral disturbances, ITP, CAD, hemorrhagic stroke who presented to ED by EMS after she had a witnessed collapse at home and unresponsiveness. CT head negative for acute findings. "EEG recorded evidence of an area of potential epileptogenicity in the right frontal temporal region. There was no seizure recorded on this study."    PT Comments    Returned for second session this PM so spouse could be present. Pt appears more irritated this afternoon, wanting to be with her spouse and wanting to return home. Performed ambulation without UE support and requires close min guard for balance/safety. Some mild drifting noted but no overt LOB. Per CM, pt returning home with hospice care. Recommend supervision for OOB mobility for safety. Will follow.    Follow Up Recommendations  No PT follow up;Supervision for mobility/OOB;Supervision/Assistance - 24 hour(hospice)     Equipment Recommendations  3in1 (PT);Wheelchair (measurements PT);Wheelchair cushion (measurements PT)    Recommendations for Other Services       Precautions / Restrictions Precautions Precautions: Fall Restrictions Weight Bearing Restrictions: No    Mobility  Bed Mobility Overal bed mobility: Needs Assistance Bed Mobility: Supine to Sit;Sit to Supine     Supine to sit: Modified independent (Device/Increase time);HOB elevated Sit to supine: Modified independent (Device/Increase time);HOB elevated   General bed mobility comments: No assist needed, increased time.  Transfers Overall transfer level: Needs assistance Equipment used: None Transfers: Sit to/from Stand Sit to Stand: Min guard         General transfer comment: Min guard for safety. Stood from [employer] x1  Ambulation/Gait Ambulation/Gait  assistance: Counsellor (Feet): 400 Feet Assistive device: None Gait Pattern/deviations: Step-through pattern;Decreased stride length;Drifts right/left Gait velocity: decreased Gait velocity interpretation: <1.31 ft/sec, indicative of household ambulator General Gait Details: Slow, mostly steady gait with some drifting noted without UE support; no overt LOB. VSS.   Stairs             Wheelchair Mobility    Modified Rankin (Stroke Patients Only)       Balance Overall balance assessment: Needs assistance Sitting-balance support: Feet supported;No upper extremity supported Sitting balance-Leahy Scale: Good     Standing balance support: During functional activity Standing balance-Leahy Scale: Fair Standing balance comment: Does better with UE support for ambulation.                            Cognition Arousal/Alertness: Awake/alert Behavior During Therapy: WFL for tasks assessed/performed Overall Cognitive Status: History of cognitive impairments - at baseline                                 General Comments: Pt appearing more irritated about this meeting she wants to be a part of. Spouse present for session. Easily distracted by spouse but able to be redirected by him.      Exercises      General Comments General comments (skin integrity, edema, etc.): Spouse present during session.      Pertinent Vitals/Pain Pain Assessment: Faces Faces Pain Scale: No hurt    Home Living  Prior Function            PT Goals (current goals can now be found in the care plan section) Progress towards PT goals: Progressing toward goals    Frequency    Min 3X/week      PT Plan Current plan remains appropriate    Co-evaluation              AM-PAC PT "6 Clicks" Mobility   Outcome Measure  Help needed turning from your back to your side while in a flat bed without using bedrails?: None Help  needed moving from lying on your back to sitting on the side of a flat bed without using bedrails?: None Help needed moving to and from a bed to a chair (including a wheelchair)?: A Little Help needed standing up from a chair using your arms (e.g., wheelchair or bedside chair)?: A Little Help needed to walk in hospital room?: A Little Help needed climbing 3-5 steps with a railing? : A Little 6 Click Score: 20    End of Session Equipment Utilized During Treatment: Gait belt Activity Tolerance: Patient tolerated treatment well Patient left: in bed;with call bell/phone within reach;with bed alarm set Nurse Communication: Mobility status PT Visit Diagnosis: Unsteadiness on feet (R26.81);History of falling (Z91.81);Muscle weakness (generalized) (M62.81);Difficulty in walking, not elsewhere classified (R26.2)     Time: [id number] PT Time Calculation (min) (ACUTE ONLY): 17 min  Charges:  $Therapeutic Exercise: 8-22 mins                     Marisa Severin, PT, DPT Acute Rehabilitation Services Pager [phone] Office [phone]       Marguarite Arbour A Sabra Heck 12/20/2019, 3:11 PM

## 2019-12-20 NOTE — Progress Notes (Signed)
Pt keeps on removing her SCD's; appears irritated with it and refusing for it to be placed back on BLE. SCD's removed. Will continue to closely monitor pt. Delia Heady RN

## 2019-12-20 NOTE — Progress Notes (Signed)
Palliative Medicine Inpatient Follow Up Note   HPI: Per Natarsha L Cliseis a 86 y.o.femalewith PMH of dementia with behavioral disturbances, ITP, CAD, hemorrhagic stroke who presented to ED by EMS after she had a witnessed collapse at home and unresponsive since that time.   Palliative care was asked to get involved to assist with goals of care. Per chart review that patient has been enrolled in [facility]" since December 2020. There does in [place] appear to be a DNR form from December 2020. There is also noted to be a Living Will from August 25th 1996.    Given patients persistent ITP and the inability to treat with IVIG d/t patients dementia she has a very poor prognosis.   Today's Discussion (12/20/2019): Chart reviewed. Patient assessment, she is bright eyed and alert this afternoon though she remains to not know where she is.  Spoke to [employer] with Vida Roller of case management. Kelli explained that given Tarissa's good physical state she would not be eligible for rehabilitation. If SNF were pursued it would be an out of pocket expense.[name] vocalized that he cannot afford a couple thousand dollars a week for this. Alternatively patient is in a physical condition where she could go home with hospice. Hospice would offer more services than at present as patient is enrolled under hospice palliative care arm. [name] does agree that the patient would be happiest in her home environment as it is familiar and she is with him.  [name] appears to feel guilty in regards to patient poor health state. Offered reassurance that he has done a fantastic job in caring for her. We discussed how this is a hard portion of life to navigate through.   He was very thankful and is in agreement with the present plan.   Discussed with patient the importance of continued conversation with family and their  medical providers regarding overall plan of care and  treatment options, ensuring decisions are within the context of the patients values and GOCs.  Questions and concerns addressed   Vital Signs Vitals:   12/20/19 0804 12/20/19 0857  BP: (!) 123/57 (!) 130/57  Pulse: 64 66  Resp: 18 16  Temp: 97.7 F (36.5 C) 98.4 F (36.9 C)  SpO2: 100% 99%    Intake/Output Summary (Last 24 hours) at 12/20/2019 1410 Last data filed at 12/20/2019 1057 Gross per 24 hour  Intake 445.6 ml  Output 400 ml  Net 45.6 ml  Physical Exam Vitals and nursing note reviewed.  Constitutional:      Appearance: She is ill-appearing.  HENT:     Head: Normocephalic.     Nose: Nose normal.     Mouth/Throat:     Mouth: Mucous membranes are dry.  Eyes:     Pupils: Pupils are equal, round, and reactive to light.  Cardiovascular:     Rate and Rhythm: Normal rate and regular rhythm.  Pulmonary:     Effort: Pulmonary effort is normal.  Abdominal:     Palpations: Abdomen is soft.  Musculoskeletal:        General: Normal range of motion.     Cervical back: Normal range of motion.  Skin:    General: Skin is warm and dry.     Capillary Refill: Capillary refill takes less than 2 seconds.     Coloration: Skin is pale.  Neurological:     General: No focal deficit present.  Psychiatric:  Mood and Affect: Mood normal.   SUMMARY OF RECOMMENDATIONS   DNAR/DNI  TOC --> [facility] Consult  Symptom Management:  Dementia w/ behavioral disturbances  - On Seroquel QHS  - On Depakote 125mg  PO Q12H  - Normally would not recommend benadryl d/t anticholinergic SE profile though patient seems to be tolerating well  - Haldol PRN  Delirium Precautions                 - Get up during the day                 - Encourage a familiar face to remain present throughout the day                 - Keep blinds open and lights on during daylight hours                 - Minimize the use of opioids/benzodiazepines   Time Spent: 55 Greater than 50%  of the time was spent in counseling and coordination of care ______________________________________________________________________________________ [place] Team Team Cell Phone: [phone] Please utilize secure chat with additional questions, if there is no response within 30 minutes please call the above phone number  Palliative Medicine Team providers are available by phone from 7am to 7pm daily and can be reached through the team cell phone.  Should this patient require assistance outside of these hours, please call the patient's attending physician.

## 2019-12-20 NOTE — Progress Notes (Addendum)
PROGRESS NOTE  [name] WFU:[id number] DOB: 08/08/1933 DOA: 12/17/2019 PCP: [name], MD  Brief summary:  [name] is a 86 y.o. female with PMH of dementia with behavioral disturbances, ITP, CAD, hemorrhagic stroke who presented to ED by EMS after she had a witnessed collapse at home and unresponsive since that time.   Patient was brought to ER by EMS after her husband witnessed her collapse around 1430. He was unsure if she hit her head. She remained unresponsive since that time and he finally called EMS. Husband noted that patient with Divalproex bottle with lid off and unsure if she took any  In the ED: Vitals initially stable but patient became bradycardic and hypotensive. Improved with fluid boluses. Requested CCM consult and they recommended Stepdown unit as patient fluid responsive. Labs remarkable for: TSH/Ethanol Level/Tylenol level/Salicylate level/Lactate/Troponin WNL. UA without evidence of infection. Valproic acid level 37, WBC 6.8, Plt 53, CK 296, Na 132 otherwise CMP WNL. CXR, CT Head/C-Spine without acute findings. Neuro consulted and performed EEG and gave Keppra; Suspect partial seizure     HPI/Recap of past 24 hours:  Intermittent agitation, oriented to person only  Platelet remain low at 18, no sign of bleeding, no petechiae, urine in canister is clear  Blood pressure stable, sinus bradycardia has improved  She ambulated with PT this am  Assessment/Plan: Principal Problem:   Collapse Active Problems:   Thrombocytopenia (HCC)   CAD (coronary artery disease)   Hyperlipidemia LDL goal <70   Chronic ITP (idiopathic thrombocytopenia) (HCC)   Port-A-Cath in place   Poor memory   Dementia with behavioral disturbance (HCC)   Hyponatremia   Acute encephalopathy   Palliative care by specialist   Goals of care, counseling/discussion   DNR (do not resuscitate)   Need for emotional support   Altered mental status, in the setting of advanced  dementia -Was reported unresponsive at home -TSH/Ethanol Level/Tylenol level/Salicylate level/Lactate/Troponin WNL, valproic acid level 37 -CT of the head no acute findings, no sign of infection identified so far -neurology consulted, suspected partial seizure, EEG performed "This EEG recorded evidence of an area of potential epileptogenicity in the right frontal temporal region. There was no seizure recorded on this study. " -Depakote dose decreased due to concern causing thrombocytopenia, he is on low-dose Depacon and Keppra now -She did well with speech therapy she is now on a regular diet -Neurology input appreciated, will follow recommendation  Intermittent agitation -No able to use Pradaxa due to thrombocytopenia side effect, will try low-dose Seroquel qhs  monitor effect, consider increase to twice a day if patient respond to Seroquel.  Hypotension/hypothermia/bradycardia (present on admission, unclear etiology, resolved) -No fever, no leukocytosis, lactic acid level unremarkable ,no source of infection identified -TSH unremarkable, am cortisol unremarkable -Fluids responsive - home blood pressure medication held on presentation, now her blood pressure start to trend up, stop IV hydration, gradually restart home BP medication, stared  Norvasc ,   stop Cardizem due to  tendency of bradycardia.  Resume sotalol at low dose today, monitor heart rate  H/o HOCM, septal myectomy 2010, CABG 2010, MV repair 2010 h/o PAF, "no anticoagulation due to prior intracranial hemorrhage". Sinus rhythm with sinus bradycardia since admission, discontinued Cardizem, home medication sotalol held since admission,  start at  low-dose today, continue tele. She follows cardiology Dr Tamala Julian   Hypokalemia ,replace k, recheck in am  Hyponatremia -Sodium 132 on presentation, normalized with hydration   Thrombocytopenia, with history of ITP -Platelet this  morning is 18.  No sign of bleeding -Case discussed  with hematology oncology Dr. Alvy Bimler who knows the patient very well recommend observation, no indication for treatment unless bleeding or platelet less than 10.  Dr. Alvy Bimler recommend palliative care consult as well.  As treatment for ITP has been rather difficult due to patient advanced dementia and poor social support -Blood culture,urine culture in process to rule out infection ,currently no fever -Total bilirubin within normal limit, less likely hemolysis -Dr. Alvy Bimler input appreciated    H/o intracranial hemorrhage with resultant memory loss   Dementia with behavior disturbances  Failure to thrive: She is followed by community palliative care service since February 2021, she likely will benefit hospice service.  Palliative care input appreciated Family meeting planned today  DVT Prophylaxis: SCDs due to thrombocytopenia  Code Status: DNR  Family Communication: husband at bedside  Disposition Plan:    Patient came from:            Home with husband                                                                                               Anticipated d/c place:  To be determined  Barriers to d/c OR conditions which need to be met to effect a safe d/c:  Thrombocytopenia, behavior issue in the setting of advanced dementia, goals of care discussion,  Addendum: home tomorrow with home hospice  Consultants:  Hematology oncology Dr. Alvy Bimler  Neurology  Critical care  Palliative care  Procedures:  None  Antibiotics:  None   Objective: BP (!) 130/57 (BP Location: Left Arm)   Pulse 66   Temp 98.4 F (36.9 C) (Oral)   Resp 16   SpO2 99%   Intake/Output Summary (Last 24 hours) at 12/20/2019 1058 Last data filed at 12/20/2019 1057 Gross per 24 hour  Intake 445.6 ml  Output 400 ml  Net 45.6 ml   There were no vitals filed for this visit.  Exam: Patient is examined daily including today on 12/20/2019, exams remain the same as of yesterday except that has changed     General: She she is more alert and more interactive, very pleasantly demented, she only oriented to self, with intermittent agitation, port-a-cath in place  Cardiovascular: Sinus bradycardia has improved  Respiratory: CTABL  Abdomen: Soft/ND/NT, positive BS  Musculoskeletal: No Edema  Neuro: Alert, pleasantly demented  Data Reviewed: Basic Metabolic Panel: Recent Labs  Lab 12/17/19 1655 12/18/19 0417 12/19/19 0426 12/20/19 0429  NA 132* 139 141 138  K 4.6 4.2 3.8 3.6  CL 100 110 103 103  CO2 22 21* 27 26  GLUCOSE 158* 105* 94 122*  BUN 26* '18 11 23  ' CREATININE 0.85 0.82 0.83 0.98  CALCIUM 8.6* 8.2* 9.0 8.8*  MG  --   --  1.9 2.0   Liver Function Tests: Recent Labs  Lab 12/17/19 1655 12/18/19 0417 12/19/19 0426  AST 27 31 33  ALT '23 21 25  ' ALKPHOS 50 45 49  BILITOT 0.6 0.5 0.2*  PROT 6.4* 5.8* 6.2*  ALBUMIN 3.3* 2.9* 3.2*   No  results for input(s): LIPASE, AMYLASE in the last 168 hours. No results for input(s): AMMONIA in the last 168 hours. CBC: Recent Labs  Lab 12/17/19 1655 12/18/19 0417 12/19/19 0426 12/20/19 0429  WBC 6.8 7.4 5.6 5.4  NEUTROABS 4.9  --  2.9 2.3  HGB 10.2* 11.1* 11.1* 10.6*  HCT 31.4* 34.6* 35.2* 33.1*  MCV 89.5 90.8 90.3 89.0  PLT 53* 18* 18* 18*   Cardiac Enzymes:   Recent Labs  Lab 12/17/19 1655 12/19/19 0426  CKTOTAL 296* 194   BNP (last 3 results) No results for input(s): BNP in the last 8760 hours.  ProBNP (last 3 results) No results for input(s): PROBNP in the last 8760 hours.  CBG: Recent Labs  Lab 12/17/19 1642  GLUCAP 148*    Recent Results (from the past 240 hour(s))  Urine culture     Status: None   Collection Time: 12/17/19  9:53 PM   Specimen: Urine, Clean Catch  Result Value Ref Range Status   Specimen Description URINE, CLEAN CATCH  Final   Special Requests NONE  Final   Culture   Final    NO GROWTH Performed at [facility], [place] [address] [place], [place] [address]    Report  Status 12/19/2019 FINAL  Final  SARS CORONAVIRUS 2 (TAT 6-24 HRS) Nasopharyngeal Nasopharyngeal Swab     Status: None   Collection Time: 12/17/19 10:36 PM   Specimen: Nasopharyngeal Swab  Result Value Ref Range Status   SARS Coronavirus 2 NEGATIVE NEGATIVE Final    Comment: (NOTE) SARS-CoV-2 target nucleic acids are NOT DETECTED. The SARS-CoV-2 RNA is generally detectable in upper and lower respiratory specimens during the acute phase of infection. Negative results do not preclude SARS-CoV-2 infection, do not rule out co-infections with other pathogens, and should not be used as the sole basis for treatment or other patient management decisions. Negative results must be combined with clinical observations, patient history, and epidemiological information. The expected result is Negative. Fact Sheet for Patients: SugarRoll.be Fact Sheet for Healthcare Providers: https://www.woods-mathews.com/ This test is not yet approved or cleared by the [place] FDA and  has been authorized for detection and/or diagnosis of SARS-CoV-2 by FDA under an Emergency Use Authorization (EUA). This EUA will remain  in effect (meaning this test can be used) for the duration of the COVID-19 declaration under Section 56 4(b)(1) of the Act, 21 U.S.C. section 360bbb-3(b)(1), unless the authorization is terminated or revoked sooner. Performed at [facility], [place] [address] [place], [place] [address]   Culture, blood (Routine X 2) w Reflex to ID Panel     Status: None (Preliminary result)   Collection Time: 12/18/19  1:42 AM   Specimen: BLOOD  Result Value Ref Range Status   Specimen Description BLOOD PORTA CATH CHEST  Final   Special Requests   Final    BOTTLES DRAWN AEROBIC AND ANAEROBIC Blood Culture adequate volume   Culture   Final    NO GROWTH 1 DAY Performed at [facility], [place] [address] [place], [place] [address]    Report Status PENDING   Incomplete  Culture, blood (Routine X 2) w Reflex to ID Panel     Status: None (Preliminary result)   Collection Time: 12/18/19  1:44 AM   Specimen: BLOOD  Result Value Ref Range Status   Specimen Description BLOOD RIGHT ARM  Final   Special Requests   Final    BOTTLES DRAWN AEROBIC AND ANAEROBIC Blood  Culture adequate volume   Culture   Final    NO GROWTH 1 DAY Performed at [facility], [place] [address], [place], [place] [address]    Report Status PENDING  Incomplete     Studies: No results found.  Scheduled Meds: . Chlorhexidine Gluconate Cloth  6 each Topical Daily  . mouth rinse  15 mL Mouth Rinse q12n4p  . sodium chloride flush  10-40 mL Intracatheter Q12H    Continuous Infusions: . levETIRAcetam 250 mg (12/20/19 0343)  . valproate sodium Stopped (12/20/19 1028)     Time spent: 67mns, I have personally reviewed and interpreted on  12/20/2019 daily labs, tele strips, imagings as discussed above under date review session and assessment and plans.  I reviewed all nursing notes, pharmacy notes, consultant notes,  vitals, pertinent old records  I have discussed plan of care as described above with RN , patient  on 12/20/2019   FFlorencia ReasonsMD, PhD, FACP  [facility]  Available via Epic secure chat 7am-7pm for nonurgent issues Please page for urgent issues, pager number available through [email or web address] .   12/20/2019, 10:58 AM  LOS: 3 days

## 2019-12-20 NOTE — Progress Notes (Addendum)
CRITICAL VALUE STICKER  CRITICAL VALUE: platelets 18   MD NOTIFIED: Bodenheimer, NP   TIME OF NOTIFICATION: 12/20/19 0551  RESPONSE: none

## 2019-12-20 NOTE — Progress Notes (Addendum)
Pt deaccessed her port despite of mittens being applied. IV team notified.

## 2019-12-20 NOTE — Progress Notes (Signed)
Physical Therapy Treatment Patient Details Name: [name] MRN: [id number] DOB: 1933-07-09 Today's Date: 12/20/2019    History of Present Illness [name] is a 86 y.o. female with PMH of dementia with behavioral disturbances, ITP, CAD, hemorrhagic stroke who presented to ED by EMS after she had a witnessed collapse at home and unresponsiveness. CT head negative for acute findings. "EEG recorded evidence of an area of potential epileptogenicity in the right frontal temporal region. There was no seizure recorded on this study."    PT Comments    Patient progressing slowly towards PT goals. Pt confused this AM and found sitting EOB, naked with chair alarm going off. Asking about her husband and wanting to go home. Tolerated ambulation with close Min guard holding onto IV pole for support. Min A at times without UE support but no overt LOB. VSS on RA.  Per CM, spouse wants to be present for session to make sure he can care for her at home. Will follow up for second session as time allows.    Follow Up Recommendations  No PT follow up;Supervision for mobility/OOB;Supervision/Assistance - 24 hour(going home with hospice)     Equipment Recommendations  3in1 (PT);Wheelchair (measurements PT);Wheelchair cushion (measurements PT)    Recommendations for Other Services       Precautions / Restrictions Precautions Precautions: Fall Restrictions Weight Bearing Restrictions: No    Mobility  Bed Mobility Overal bed mobility: Needs Assistance Bed Mobility: Sit to Supine       Sit to supine: Min guard;HOB elevated   General bed mobility comments: Found sitting EOB naked with alarm going off. Able to transfer self back into bed  Transfers Overall transfer level: Needs assistance Equipment used: None Transfers: Sit to/from Stand Sit to Stand: Min guard         General transfer comment: Min guard for safety. Stood from [employer], reaching for IV pole for  support.  Ambulation/Gait Ambulation/Gait assistance: Min guard;Min assist Gait Distance (Feet): 200 Feet Assistive device: IV Pole Gait Pattern/deviations: Step-through pattern;Decreased stride length Gait velocity: decreased   General Gait Details: Slow, steady gait holding onto IV pole for support.   Stairs             Wheelchair Mobility    Modified Rankin (Stroke Patients Only)       Balance Overall balance assessment: Needs assistance Sitting-balance support: Feet supported;No upper extremity supported Sitting balance-Leahy Scale: Good     Standing balance support: During functional activity Standing balance-Leahy Scale: Fair Standing balance comment: Does better with UE support for ambulation.                            Cognition Arousal/Alertness: Awake/alert Behavior During Therapy: WFL for tasks assessed/performed Overall Cognitive Status: History of cognitive impairments - at baseline                                 General Comments: Pt confused today, found sitting EOB naked after transferring self to chair and alarm going off. Oriented to self. Follows commands with repetition.      Exercises      General Comments General comments (skin integrity, edema, etc.): Asking about spouse entire session and wanting to return home. VSS on RA.      Pertinent Vitals/Pain Pain Assessment: Faces Faces Pain Scale: No hurt    Home Living  Prior Function            PT Goals (current goals can now be found in the care plan section) Progress towards PT goals: Progressing toward goals    Frequency    Min 3X/week      PT Plan Current plan remains appropriate    Co-evaluation              AM-PAC PT "6 Clicks" Mobility   Outcome Measure  Help needed turning from your back to your side while in a flat bed without using bedrails?: None Help needed moving from lying on your back to  sitting on the side of a flat bed without using bedrails?: None Help needed moving to and from a bed to a chair (including a wheelchair)?: A Little Help needed standing up from a chair using your arms (e.g., wheelchair or bedside chair)?: A Little Help needed to walk in hospital room?: A Little Help needed climbing 3-5 steps with a railing? : A Little 6 Click Score: 20    End of Session Equipment Utilized During Treatment: Gait belt Activity Tolerance: Patient tolerated treatment well Patient left: in bed;with call bell/phone within reach;with bed alarm set Nurse Communication: Mobility status PT Visit Diagnosis: Unsteadiness on feet (R26.81);History of falling (Z91.81);Muscle weakness (generalized) (M62.81);Difficulty in walking, not elsewhere classified (R26.2)     Time: [id number] PT Time Calculation (min) (ACUTE ONLY): 12 min  Charges:  $Therapeutic Activity: 8-22 mins                     Marisa Severin, PT, DPT Acute Rehabilitation Services Pager [phone] Office [phone]       Marguarite Arbour A Sabra Heck 12/20/2019, 2:42 PM

## 2019-12-21 DIAGNOSIS — Z66 Do not resuscitate: Secondary | ICD-10-CM

## 2019-12-21 DIAGNOSIS — E876 Hypokalemia: Secondary | ICD-10-CM

## 2019-12-21 LAB — BASIC METABOLIC PANEL
Anion gap: 12 (ref 5–15)
BUN: 15 mg/dL (ref 8–23)
CO2: 26 mmol/L (ref 22–32)
Calcium: 9.5 mg/dL (ref 8.9–10.3)
Chloride: 102 mmol/L (ref 98–111)
Creatinine, Ser: 0.91 mg/dL (ref 0.44–1.00)
GFR calc Af Amer: >60 mL/min (ref >60)
GFR calc non Af Amer: 58 mL/min — ABNORMAL LOW (ref >60)
Glucose, Bld: 124 mg/dL — ABNORMAL HIGH (ref 70–99)
Potassium: 3.8 mmol/L (ref 3.5–5.1)
Sodium: 140 mmol/L (ref 135–145)

## 2019-12-21 LAB — CBC WITH DIFFERENTIAL/PLATELET
Abs Immature Granulocytes: 0.03 10*3/uL (ref 0.00–0.07)
Basophils Absolute: 0 10*3/uL (ref 0.0–0.1)
Basophils Relative: 0 %
Eosinophils Absolute: 0.3 10*3/uL (ref 0.0–0.5)
Eosinophils Relative: 4 %
HCT: 36.5 % (ref 36.0–46.0)
Hemoglobin: 11.8 g/dL — ABNORMAL LOW (ref 12.0–15.0)
Immature Granulocytes: 0 %
Lymphocytes Relative: 35 %
Lymphs Abs: 2.5 10*3/uL (ref 0.7–4.0)
MCH: 29 pg (ref 26.0–34.0)
MCHC: 32.3 g/dL (ref 30.0–36.0)
MCV: 89.7 fL (ref 80.0–100.0)
Monocytes Absolute: 1 10*3/uL (ref 0.1–1.0)
Monocytes Relative: 15 %
Neutro Abs: 3.2 10*3/uL (ref 1.7–7.7)
Neutrophils Relative %: 46 %
Platelets: 25 10*3/uL — CL (ref 150–400)
RBC: 4.07 MIL/uL (ref 3.87–5.11)
RDW: 13.8 % (ref 11.5–15.5)
WBC: 7 10*3/uL (ref 4.0–10.5)
nRBC: 0 % (ref 0.0–0.2)

## 2019-12-21 LAB — MAGNESIUM: Magnesium: 2.1 mg/dL (ref 1.7–2.4)

## 2019-12-21 MED ORDER — MIRTAZAPINE 15 MG PO TABS: 15.0000 mg | ORAL_TABLET | Freq: Every day | ORAL | 0 refills | Status: DC

## 2019-12-21 MED ORDER — LEVETIRACETAM 250 MG PO TABS: 250.0000 mg | ORAL_TABLET | Freq: Two times a day (BID) | ORAL | 0 refills | Status: AC

## 2019-12-21 MED ORDER — QUETIAPINE FUMARATE 25 MG PO TABS
25.0000 mg | ORAL_TABLET | Freq: Once | ORAL | Status: AC
  Administered 2019-12-21: 25 mg via ORAL
  Filled 2019-12-21: qty 1

## 2019-12-21 MED ORDER — QUETIAPINE FUMARATE 25 MG PO TABS: 25.0000 mg | ORAL_TABLET | Freq: Two times a day (BID) | ORAL | 0 refills | Status: AC

## 2019-12-21 MED ORDER — HALOPERIDOL LACTATE 5 MG/ML IJ SOLN
0.5000 mg | Freq: Four times a day (QID) | INTRAMUSCULAR | Status: DC | PRN
  Administered 2019-12-21 (×2): 0.5 mg via INTRAMUSCULAR
  Filled 2019-12-21 (×2): qty 1

## 2019-12-21 MED ORDER — SOTALOL HCL 80 MG PO TABS: 80.0000 mg | ORAL_TABLET | Freq: Two times a day (BID) | ORAL | 0 refills | Status: AC

## 2019-12-21 NOTE — Social Work (Signed)
CSW was contacted by nurse, requesting clothes for patient to discharge to hospice. CSW provided nurse with a set of clothes for patient.

## 2019-12-21 NOTE — Progress Notes (Signed)
Palliative Medicine Inpatient Follow Up Note   HPI: Per [name] a 86 y.o.femalewith PMH of dementia with behavioral disturbances, ITP, CAD, hemorrhagic stroke who presented to ED by EMS after she had a witnessed collapse at home and unresponsive since that time.   Palliative care was asked to get involved to assist with goals of care. Per chart review that patient has been enrolled in "[facility]" since December 2020. There does in [place] appear to be a DNR form from December 2020. There is also noted to be a Living Will from August 25th 1996.    Given patients persistent ITP and the inability to treat with IVIG d/t patients dementia she has a very poor prognosis.   Today's Discussion (12/21/2019): Chart reviewed. Patient assessment, she is in good spirits this morning. I shared with her the plan for her to be discharged this morning. She remains to ask where he husband is.  [name] is aware of plan for this AM.   Questions and concerns addressed   Vital Signs Vitals:   12/21/19 0415 12/21/19 0727  BP: (!) 152/72 (!) 157/70  Pulse: 63 67  Resp: 20 18  Temp: 97.7 F (36.5 C) 97.7 F (36.5 C)  SpO2: 99% 100%    Intake/Output Summary (Last 24 hours) at 12/21/2019 1019 Last data filed at 12/20/2019 1057 Gross per 24 hour  Intake 51.3 ml  Output --  Net 51.3 ml  Physical Exam Vitals and nursing note reviewed.  Constitutional:      Appearance: She is ill-appearing.  HENT:     Head: Normocephalic.     Nose: Nose normal.     Mouth/Throat:     Mouth: Mucous membranes are dry.  Eyes:     Pupils: Pupils are equal, round, and reactive to light.  Cardiovascular:     Rate and Rhythm: Normal rate and regular rhythm.  Pulmonary:     Effort: Pulmonary effort is normal.  Abdominal:     Palpations: Abdomen is soft.  Musculoskeletal:        General: Normal range of motion.     Cervical back: Normal range of motion.   Skin:    General: Skin is warm and dry.     Capillary Refill: Capillary refill takes less than 2 seconds.     Coloration: Skin is pale.  Neurological:     General: No focal deficit present.  Psychiatric:        Mood and Affect: Mood normal.   SUMMARY OF RECOMMENDATIONS   DNAR/DNI  Discharge today [facility]  Symptom Management:  Dementia w/ behavioral disturbances  - On Seroquel QHS  - On Depakote 125mg  PO Q12H  - Normally would not recommend benadryl d/t anticholinergic SE profile though patient seems to be tolerating well  - Haldol PRN  Delirium Precautions                 - Get up during the day                 - Encourage a familiar face to remain present throughout the day                 - Keep blinds open and lights on during daylight hours                 - Minimize the use of opioids/benzodiazepines   Time  Spent: 15 Greater than 50% of the time was spent in counseling and coordination of care ______________________________________________________________________________________ [place] Team Team Cell Phone: [phone] Please utilize secure chat with additional questions, if there is no response within 30 minutes please call the above phone number  Palliative Medicine Team providers are available by phone from 7am to 7pm daily and can be reached through the team cell phone.  Should this patient require assistance outside of these hours, please call the patient's attending physician.

## 2019-12-21 NOTE — TOC Transition Note (Signed)
Transition of Care [facility]) - CM/SW Discharge Note   Patient Details  Name: [name] MRN: [id number] Date of Birth: 1933/01/19  [facility] CM/SW Contact:  Carles Collet, RN Phone Number: 12/21/2019, 9:24 AM   Clinical Narrative:    Eleanora Neighbor w [facility] that patient will DC from the hospital today.       Barriers to Discharge: Continued Medical Work up   Patient Goals and CMS Choice   CMS Medicare.gov Compare Post Acute Care list provided to:: Patient Represenative (must comment) Choice offered to / list presented to : Spouse  Discharge Placement                       Discharge Plan and Services   Discharge Planning Services: CM Consult Post Acute Care Choice: Hospice                               Social Determinants of Health (SDOH) Interventions     Readmission Risk Interventions No flowsheet data found.

## 2019-12-21 NOTE — Consult Note (Signed)
[facility]: Pt has been approved for homecare services and will be admitted 12-21-2108:00am per husband request. Thank you for the opportunity to serve this patient and family. Doroteo Glassman, RN [facility] [phone]

## 2019-12-21 NOTE — Discharge Summary (Signed)
Discharge Summary  JANIYIA BURKARD [id number] DOB: 12/01/1932  PCP: [name], MD  Admit date: 12/17/2019 Discharge date: 12/21/2019  Time spent: 50mins, more than 50% time spent on coordination of care.  Recommendations for Outpatient Follow-up:  1. F/u with home hospice 2. F/u with PCP as needed  Discharge Diagnoses:  Active Hospital Problems   Diagnosis Date Noted  . Collapse 12/17/2019  . Palliative care by specialist   . Goals of care, counseling/discussion   . DNR (do not resuscitate)   . Need for emotional support   . Hyponatremia 12/18/2019  . Acute encephalopathy 12/18/2019  . Dementia with behavioral disturbance ([place]) 12/17/2019  . Poor memory 11/17/2017  . Port-A-Cath in place 08/25/2017  . Chronic ITP (idiopathic thrombocytopenia) (HCC) 05/02/2017  . Hyperlipidemia LDL goal <70 09/29/2016  . CAD (coronary artery disease) 04/08/2014  . Thrombocytopenia [facility])     Resolved Hospital Problems  No resolved problems to display.    Discharge Condition: stable  Diet recommendation: Regular diet  There were no vitals filed for this visit.  History of present illness: (Per admitting MD Dr. Marice Potter) PCP: [name], MD   Chief Complaint: Collapse, Altered Mental Status   HPI: NATALLE KUHN is a 86 y.o. female with PMH of dementia with behavioral disturbances, ITP, CAD, hemorrhagic stroke who presented to ED by EMS after she had a witnessed collapse at home and unresponsive since that time.   Patient unable to provide history; obtained from chart review and EDP. Patient was brought to ER by EMS after her husband witnessed her collapse around 1430. He was unsure if she hit her head. She remained unresponsive since that time and he finally called EMS. Husband noted that patient with Divalproex bottle with lid off and unsure if she took any. No other history could be obtained.   In the ED: Vitals initially stable but patient became bradycardic and  hypotensive. Improved with fluid boluses. Requested CCM consult and they recommended Stepdown unit as patient fluid responsive. Labs remarkable for: TSH/Ethanol Level/Tylenol level/Salicylate level/Lactate/Troponin WNL. UA without evidence of infection. Valproic acid level 37, WBC 6.8, Plt 53, CK 296, Na 132 otherwise CMP WNL. CXR, CT Head/C-Spine without acute findings. Neuro consulted and performed EEG and gave Keppra; Suspect partial seizure.[facility] Course:  Principal Problem:   Collapse Active Problems:   Thrombocytopenia (HCC)   CAD (coronary artery disease)   Hyperlipidemia LDL goal <70   Chronic ITP (idiopathic thrombocytopenia) (HCC)   Port-A-Cath in place   Poor memory   Dementia with behavioral disturbance (HCC)   Hyponatremia   Acute encephalopathy   Palliative care by specialist   Goals of care, counseling/discussion   DNR (do not resuscitate)   Need for emotional support   Altered mental status, in the setting of advanced dementia, partial seizure (present on admission) -Was reported unresponsive at home -TSH/Ethanol Level/Tylenol level/Salicylate level/Lactate/Troponin WNL, valproic acid level 37 -CT of the head no acute findings, no sign of infection identified so far -neurology consulted, suspected partial seizure, EEG performed "ThisEEG recorded evidence of an area of potential epileptogenicity in the right frontal temporal region.There was no seizure recorded on this study. " -Depakote dose decreased due to concern causing thrombocytopenia, he is on low-dose Depacon and Keppra now -She did well with speech therapy she is now on a regular diet -She is stable on Depakote 125 mg p.o. twice daily and Keppra 250 mg p.o. twice daily, no seizure activity since in the hospital, she is  not alert awake and interactive with intermittent agitation, appear to close to her baseline -Neurology input appreciated  Intermittent agitation -No able to use Zyprexa due to  thrombocytopenia side effect. -She responded to low-dose Seroquel qhs  -She is discharged on Seroquel 25 mg twice daily.  Hypotension/hypothermia (present on admission), resolved -Unclear etiology  -no fever, no leukocytosis, lactic acid level unremarkable ,no source of infection identified, blood culture negative, urine culture negative -TSH unremarkable, am cortisol unremarkable -Fluids responsive - home blood pressure medication held on presentation, now her blood pressure start to trend up, stop IV hydration, gradually restart home BP medication,  restart start Norvasc  -Discontinue Cardizem due to  tendency of bradycardia -Resume sotalol at a lower dose, monitor heart rate  H/o HOCM, septal myectomy 2010, CABG 2010,MV repair 2010 h/o PAF,"noanticoagulation due to prior intracranial hemorrhage".  Patient has sinus rhythm with sinus bradycardia since admission, discontinued Cardizem, home medication sotalol held since admission,  she is discharged on decreased dose of sotalol. She follows cardiology Dr Tamala Julian  Hypertension She is discharged on Norvasc and sotalol  Hyponatremia -Sodium 132 on presentation, normalized with hydration  Hypokalemia, replaced, normalized   Thrombocytopenia, with history of ITP -Platelet this morning is 18.  No sign of bleeding -Hematology oncology Dr. Alvy Bimler recommend palliative care consult, As treatment for ITP has been rather difficult due to patient advanced dementia and poor social support -Palliative care consult appreciated, husband elected to bring patient home with home hospice,  H/o intracranial hemorrhage with resultant memory loss  Dementia with behavior disturbances  Failure to thrive: She is followed by community palliative care service since February 2021,  palliative care consulted , input appreciated .  Husband elected to bring patient home with home hospice .  DVT Prophylaxis: SCDs due to thrombocytopenia  Code  Status: DNR  Family Communication: patient , husband at bedside on April 2  Disposition Plan:    Patient came from: Home with husband  Anticipated d/c place:  Home with home hospice   Consultants:  Hematology oncology Dr. Alvy Bimler  Neurology  Critical care  Palliative care  Procedures:  EEG  Antibiotics:  None   Discharge Exam: BP (!) 157/70 (BP Location: Left Arm)   Pulse 67   Temp 97.7 F (36.5 C) (Oral)   Resp 18   SpO2 100%   General: She is pleasantly demented, oriented to person only Cardiovascular: Regular rate rhythm Respiratory: Clear to auscultation bilaterally  Discharge Instructions You were cared for by a hospitalist during your hospital stay. If you have any questions about your discharge medications or the care you received while you were in the hospital after you are discharged, you can call the unit and asked to speak with the hospitalist on call if the hospitalist that took care of you is not available. Once you are discharged, your primary care physician will handle any further medical issues. Please note that NO REFILLS for any discharge medications will be authorized once you are discharged, as it is imperative that you return to your primary care physician (or establish a relationship with a primary care physician if you do not have one) for your aftercare needs so that they can reassess your need for medications and monitor your lab values.  Discharge Instructions    Diet general   Complete by: As directed    Increase activity slowly   Complete by: As directed      Allergies as of 12/21/2019      Reactions  Bee Venom Anaphylaxis   Iodides Anaphylaxis   Iodinated Diagnostic Agents Anaphylaxis   Lyrica [pregabalin]    Codeine Other (See Comments)   Reaction: hallucinate   Demerol [meperidine] Hives, Other (See Comments)    REACTION: Welts   Ambien [zolpidem Tartrate] Other (See Comments)   Altered Mental Status   Aspirin Other (See Comments)   Bleeding, aneurysm- when she was on plavix Bleeding, aneurysm- when she was on plavix   Celebrex [celecoxib] Other (See Comments)   Legs hurt   Darvocet [propoxyphene N-acetaminophen] Other (See Comments)   Hallucinates   Fosamax [alendronate Sodium] Other (See Comments)   Legs hurt   Lipitor [atorvastatin] Other (See Comments)   Legs hurt   Morphine And Related Other (See Comments)   Hallucinations   Propoxyphene Other (See Comments)   Hallucinates   Valium [diazepam] Other (See Comments)   Altered Mental Status   Iodine Hives   Welts      Medication List    STOP taking these medications   diltiazem 120 MG 24 hr capsule Commonly known as: TIAZAC   escitalopram 5 MG tablet Commonly known as: Lexapro   ezetimibe 10 MG tablet Commonly known as: ZETIA   Fish Oil 1000 MG Caps   omeprazole 40 MG capsule Commonly known as: PRILOSEC   traZODone 50 MG tablet Commonly known as: DESYREL     TAKE these medications   Adrenalin 30 MG/30ML Soln injection Generic drug: EPINEPHrine Inject 1 mg as directed See admin instructions. As directed for emergency   amLODipine 5 MG tablet Commonly known as: NORVASC Take 1 tablet (5 mg total) by mouth daily.   calcium-vitamin D 500-200 MG-UNIT Tabs tablet Commonly known as: OSCAL WITH D Take 1 tablet by mouth daily.   Cholecalciferol 25 MCG (1000 UT) capsule Take 1 capsule by mouth daily.   divalproex 125 MG capsule Commonly known as: DEPAKOTE SPRINKLE Take 125 mg by mouth in the morning and at bedtime.   L-Lysine 500 MG Tabs Take 500 mg by mouth daily as needed.   levETIRAcetam 250 MG tablet Commonly known as: KEPPRA Take 1 tablet (250 mg total) by mouth 2 (two) times daily.   lidocaine 5 % Commonly known as: Lidoderm Place 1 patch onto the skin daily. Remove & Discard patch within 12 hours or as  directed by MD   meclizine 25 MG tablet Commonly known as: ANTIVERT Take 25 mg by mouth 3 (three) times daily as needed for dizziness.   QUEtiapine 25 MG tablet Commonly known as: SEROQUEL Take 1 tablet (25 mg total) by mouth 2 (two) times daily. What changed:   medication strength  how much to take  when to take this   rosuvastatin 10 MG tablet Commonly known as: CRESTOR Take 10 mg by mouth daily.   sotalol 80 MG tablet Commonly known as: BETAPACE Take 1 tablet (80 mg total) by mouth 2 (two) times daily. What changed:   medication strength  how much to take      Allergies  Allergen Reactions  . Bee Venom Anaphylaxis  . Iodides Anaphylaxis  . Iodinated Diagnostic Agents Anaphylaxis  . Lyrica [Pregabalin]   . Codeine Other (See Comments)    Reaction: hallucinate  . Demerol [Meperidine] Hives and Other (See Comments)    REACTION: Welts  . Ambien [Zolpidem Tartrate] Other (See Comments)    Altered Mental Status  . Aspirin Other (See Comments)    Bleeding, aneurysm- when she was on plavix Bleeding, aneurysm- when she  was on plavix  . Celebrex [Celecoxib] Other (See Comments)    Legs hurt  . Darvocet [Propoxyphene N-Acetaminophen] Other (See Comments)    Hallucinates  . Fosamax [Alendronate Sodium] Other (See Comments)    Legs hurt  . Lipitor [Atorvastatin] Other (See Comments)    Legs hurt  . Morphine And Related Other (See Comments)    Hallucinations  . Propoxyphene Other (See Comments)    Hallucinates  . Valium [Diazepam] Other (See Comments)    Altered Mental Status  . Iodine Hives    Welts   Follow-up Information    [facility] Follow up.   Specialty: PALLIATIVE CARE Why: [place] will call you to arrange your home appointment.  Contact information: [place] [phone]       Heath Lark, MD Follow up.   Specialty: Hematology and Oncology Why: as needed for  thrombocytopenia Contact information: [place] [id number] [place] Follow up.   Why: for seizure management as needed Contact information: [address]     [address] [place] [phone]       [name], MD Follow up.   Specialty: Family Medicine Why: as needed Contact information: [place], [place] [address] [phone]        Belva Crome, MD Follow up.   Specialty: Cardiology Why: as needed for atrial fibrillation  Contact information: [address] [place] [address] [phone]            The results of significant diagnostics from this hospitalization (including imaging, microbiology, ancillary and laboratory) are listed below for reference.    Significant Diagnostic Studies: CT Head Wo Contrast  Result Date: 12/17/2019 CLINICAL DATA:  [place], unresponsive, altered level of consciousness EXAM: CT HEAD WITHOUT CONTRAST TECHNIQUE: Contiguous axial images were obtained from the base of the skull through the vertex without intravenous contrast. COMPARISON:  09/21/2019 FINDINGS: Brain: Chronic ischemic changes are seen within the right temporal and left occipital lobes. Chronic small vessel ischemic changes are seen within the right frontal subcortical white matter. No signs of acute infarct or hemorrhage. Lateral ventricles and midline structures are otherwise unremarkable. No acute extra-axial fluid collections. No mass effect. Vascular: No hyperdense vessel or unexpected calcification. Skull: Normal. Negative for fracture or focal lesion. Sinuses/Orbits: Chronic mucosal thickening of the bilateral maxillary sinuses, right greater than left. Mucoperiosteal thickening throughout the ethmoid air cells. Hypoplastic frontal sinuses. Other: None IMPRESSION: 1. Stable chronic ischemic changes.  No acute intracranial  process. 2. Sinus disease as above. Electronically Signed   By: Randa Ngo M.D.   On: 12/17/2019 18:38   CT Cervical Spine Wo Contrast  Result Date: 12/17/2019 CLINICAL DATA:  [place], unresponsive, altered level of consciousness EXAM: CT CERVICAL SPINE WITHOUT CONTRAST TECHNIQUE: Multidetector CT imaging of the cervical spine was performed without intravenous contrast. Multiplanar CT image reconstructions were also generated. COMPARISON:  None. FINDINGS: Alignment: Alignment is grossly anatomic. Skull base and vertebrae: No acute displaced cervical spine fractures. Soft tissues and spinal canal: No prevertebral fluid or swelling. No visible canal hematoma. Right chest wall port via internal jugular approach is identified. Disc levels: There is mild diffuse spondylosis greatest at C4-5 and C5-6. Mild facet hypertrophy at the cervicothoracic junction. No significant bony encroachment upon the central canal. Asymmetric right neural foraminal narrowing at C4-5 and C5-6.  Upper chest: Airway is patent.  Lung apices are clear. Other: Reconstructed images demonstrate no additional findings. IMPRESSION: 1. Multilevel cervical spondylosis greatest at C4-5 and C5-6. 2. No acute cervical spine fracture. Electronically Signed   By: Randa Ngo M.D.   On: 12/17/2019 18:40   DG Chest Port 1 View  Result Date: 12/17/2019 CLINICAL DATA:  [place], unresponsive EXAM: PORTABLE CHEST 1 VIEW COMPARISON:  09/21/2019 FINDINGS: Post sternotomy changes. Right-sided central venous port tip over the right atrial IVC junction. Mitral annular calcification. No pleural effusion, focal airspace disease, or pneumothorax. Stable cardiomediastinal silhouette with aortic atherosclerosis. IMPRESSION: No active disease. Right-sided central venous port tip projects over right atrial IVC junction as before. Electronically Signed   By: Donavan Foil M.D.   On: 12/17/2019 17:36   Portable EEG  Result Date: 12/17/2019 Greta Doom, MD      12/17/2019 11:49 PM History: 86 year old female presenting with episode of unresponsiveness with increased tone Sedation: None Technique: This is a 21 channel routine scalp EEG performed at the bedside with bipolar and monopolar montages arranged in accordance to the international 10/20 system of electrode placement. One channel was dedicated to EKG recording. Background: There is a posterior dominant rhythm seen at times, with a frequency of 8 to 9 Hz.  In addition, there is admixed generalized irregular delta and theta range activity which is relatively high voltage.  There are occasional sharp waves seen at F8 >T8 with variable spread to Fp2 and F4 and P8. Photic stimulation: Physiologic driving is not performed EEG Abnormalities: 1) right frontotemporal sharp waves 2) generalized irregular slow activity Clinical Interpretation: This EEG recorded evidence of an area of potential epileptogenicity in the right frontal temporal region. There was no seizure recorded on this study. Roland Rack, MD [facility] [phone] If 7pm- 7am, please page neurology on call as listed in [place].    Microbiology: Recent Results (from the past 240 hour(s))  Urine culture     Status: None   Collection Time: 12/17/19  9:53 PM   Specimen: Urine, Clean Catch  Result Value Ref Range Status   Specimen Description URINE, CLEAN CATCH  Final   Special Requests NONE  Final   Culture   Final    NO GROWTH Performed at [facility], [address], [place], [place] [address]    Report Status 12/19/2019 FINAL  Final  SARS CORONAVIRUS 2 (TAT 6-24 HRS) Nasopharyngeal Nasopharyngeal Swab     Status: None   Collection Time: 12/17/19 10:36 PM   Specimen: Nasopharyngeal Swab  Result Value Ref Range Status   SARS Coronavirus 2 NEGATIVE NEGATIVE Final    Comment: (NOTE) SARS-CoV-2 target nucleic acids are NOT DETECTED. The SARS-CoV-2 RNA is generally detectable in upper and lower respiratory specimens  during the acute phase of infection. Negative results do not preclude SARS-CoV-2 infection, do not rule out co-infections with other pathogens, and should not be used as the sole basis for treatment or other patient management decisions. Negative results must be combined with clinical observations, patient history, and epidemiological information. The expected result is Negative. Fact Sheet for Patients: SugarRoll.be Fact Sheet for Healthcare Providers: https://www.woods-mathews.com/ This test is not yet approved or cleared by the [place] FDA and  has been authorized for detection and/or diagnosis of SARS-CoV-2 by FDA under an Emergency Use Authorization (EUA). This EUA will remain  in effect (meaning this test can be used) for the duration of the COVID-19 declaration under Section 56 4(b)(1) of  the Act, 21 U.S.C. section 360bbb-3(b)(1), unless the authorization is terminated or revoked sooner. Performed at [facility], [place] [address], [place], [place] [address]   Culture, blood (Routine X 2) w Reflex to ID Panel     Status: None (Preliminary result)   Collection Time: 12/18/19  1:42 AM   Specimen: BLOOD  Result Value Ref Range Status   Specimen Description BLOOD PORTA CATH CHEST  Final   Special Requests   Final    BOTTLES DRAWN AEROBIC AND ANAEROBIC Blood Culture adequate volume   Culture   Final    NO GROWTH 2 DAYS Performed at [facility], [place] [address] [place], [place] [address]    Report Status PENDING  Incomplete  Culture, blood (Routine X 2) w Reflex to ID Panel     Status: None (Preliminary result)   Collection Time: 12/18/19  1:44 AM   Specimen: BLOOD  Result Value Ref Range Status   Specimen Description BLOOD RIGHT ARM  Final   Special Requests   Final    BOTTLES DRAWN AEROBIC AND ANAEROBIC Blood Culture adequate volume   Culture   Final    NO GROWTH 2 DAYS Performed at [facility], [place]  [address] [place], [place] [address]    Report Status PENDING  Incomplete     Labs: Basic Metabolic Panel: Recent Labs  Lab 12/17/19 1655 12/18/19 0417 12/19/19 0426 12/20/19 0429 12/21/19 0331  NA 132* 139 141 138 140  K 4.6 4.2 3.8 3.6 3.8  CL 100 110 103 103 102  CO2 22 21* 27 26 26   GLUCOSE 158* 105* 94 122* 124*  BUN 26* 18 11 23 15   CREATININE 0.85 0.82 0.83 0.98 0.91  CALCIUM 8.6* 8.2* 9.0 8.8* 9.5  MG  --   --  1.9 2.0 2.1   Liver Function Tests: Recent Labs  Lab 12/17/19 1655 12/18/19 0417 12/19/19 0426  AST 27 31 33  ALT 23 21 25   ALKPHOS 50 45 49  BILITOT 0.6 0.5 0.2*  PROT 6.4* 5.8* 6.2*  ALBUMIN 3.3* 2.9* 3.2*   No results for input(s): LIPASE, AMYLASE in the last 168 hours. No results for input(s): AMMONIA in the last 168 hours. CBC: Recent Labs  Lab 12/17/19 1655 12/18/19 0417 12/19/19 0426 12/20/19 0429 12/21/19 0331  WBC 6.8 7.4 5.6 5.4 7.0  NEUTROABS 4.9  --  2.9 2.3 3.2  HGB 10.2* 11.1* 11.1* 10.6* 11.8*  HCT 31.4* 34.6* 35.2* 33.1* 36.5  MCV 89.5 90.8 90.3 89.0 89.7  PLT 53* 18* 18* 18* 25*   Cardiac Enzymes: Recent Labs  Lab 12/17/19 1655 12/19/19 0426  CKTOTAL 296* 194   BNP: BNP (last 3 results) No results for input(s): BNP in the last 8760 hours.  ProBNP (last 3 results) No results for input(s): PROBNP in the last 8760 hours.  CBG: Recent Labs  Lab 12/17/19 1642  GLUCAP 148*       Signed:  Florencia Reasons MD, PhD, FACP  [facility] 12/21/2019, 10:24 AM

## 2019-12-21 NOTE — Progress Notes (Signed)
CRITICAL VALUE STICKER  CRITICAL VALUE: platelets 25   MD NOTIFIED: Bodenheimer, NP  TIME OF NOTIFICATION: 12/21/19 0442   RESPONSE:

## 2019-12-21 NOTE — Progress Notes (Signed)
Pt discharge education and instructions completed with pt and spouse at bedside; spouse voices understanding and denies any questions. Pt husband upset about pt clothing being cut off pt on admission and he didn't know he had to bring clothing for pt. Mr. Sluyter informed not to worry and RN will contact social work for clothing. CSW notified and clothes obtained for pt to put on. Pt telemetry removed. Pt spouse to pick up electronically sent prescriptions from preferred pharmacy on file. Pt discharge home with spouse to transport her home. Pt to be transported off unit via wheelchair with belongings and spouse to the side. Delia Heady RN

## 2019-12-23 LAB — CULTURE, BLOOD (ROUTINE X 2)
Culture: NO GROWTH
Culture: NO GROWTH
Special Requests: ADEQUATE
Special Requests: ADEQUATE

## 2019-12-24 ENCOUNTER — Telehealth: Payer: Self-pay

## 2019-12-24 NOTE — Telephone Encounter (Signed)
Called and given below message. Husband verbalized understanding. Offered appt with Dr. Alvy Bimler. Husband refused appointment and does not see the point of the appt at this time. His wife's dementia has gotten so bad. He was told to follow up with Dr. Alvy Bimler and neuro, he said he is not going to follow up with either because he not see the point. [facility] is still following. Instructed to call the office if needed. He verbalized understanding.

## 2019-12-24 NOTE — Telephone Encounter (Signed)
I tried calling him many times and could not get hold of him Unless infusion room policy changes, he cannot be with her during treatment and she cannot be treated without him Can you call him and set up return appointment to discuss? No labs

## 2019-12-24 NOTE — Telephone Encounter (Signed)
[name] with [facility] called and left a message. They went out over the weekend to transition from palliative care to Hospice. When they went out husband refused to enroll in Hospice. [facility] will continue to monitor and work with spouse. Mechele Claude saw your notes in Epic. Spouse told [facility]  that he would like for his wife to continue to see Dr. Alvy Bimler and get treatment. They will continue to monitor and keep the office updated.

## 2020-04-05 DIAGNOSIS — R410 Disorientation, unspecified: Secondary | ICD-10-CM | POA: Diagnosis not present

## 2020-04-05 DIAGNOSIS — I6200 Nontraumatic subdural hemorrhage, unspecified: Secondary | ICD-10-CM | POA: Diagnosis not present

## 2020-04-05 DIAGNOSIS — F0151 Vascular dementia with behavioral disturbance: Secondary | ICD-10-CM | POA: Diagnosis not present

## 2020-04-05 DIAGNOSIS — R69 Illness, unspecified: Secondary | ICD-10-CM | POA: Diagnosis not present

## 2020-04-05 DIAGNOSIS — S065X0A Traumatic subdural hemorrhage without loss of consciousness, initial encounter: Secondary | ICD-10-CM | POA: Diagnosis not present

## 2020-04-05 DIAGNOSIS — R4182 Altered mental status, unspecified: Secondary | ICD-10-CM | POA: Diagnosis not present

## 2020-04-05 DIAGNOSIS — D696 Thrombocytopenia, unspecified: Secondary | ICD-10-CM | POA: Diagnosis not present

## 2020-04-05 DIAGNOSIS — M7981 Nontraumatic hematoma of soft tissue: Secondary | ICD-10-CM | POA: Diagnosis not present

## 2020-04-05 DIAGNOSIS — G9389 Other specified disorders of brain: Secondary | ICD-10-CM | POA: Diagnosis not present

## 2020-04-05 DIAGNOSIS — J9811 Atelectasis: Secondary | ICD-10-CM | POA: Diagnosis not present

## 2020-04-05 DIAGNOSIS — Z862 Personal history of diseases of the blood and blood-forming organs and certain disorders involving the immune mechanism: Secondary | ICD-10-CM | POA: Diagnosis not present

## 2020-04-05 DIAGNOSIS — I959 Hypotension, unspecified: Secondary | ICD-10-CM | POA: Diagnosis not present

## 2020-04-05 DIAGNOSIS — F69 Unspecified disorder of adult personality and behavior: Secondary | ICD-10-CM | POA: Diagnosis not present

## 2020-04-19 DEATH — deceased

## 2021-05-18 IMAGING — CT CT CERVICAL SPINE W/O CM
3 series · 15 of 33 positions shown, 18 images · non-contrast
Comparison: None.

CLINICAL DATA: Fell, unresponsive, altered level of consciousness

EXAM:
CT CERVICAL SPINE WITHOUT CONTRAST
TECHNIQUE: Multidetector CT imaging of the cervical spine was performed without
intravenous contrast. Multiplanar CT image reconstructions were also
generated.

[Series 4: c_spine 2.0 st · axial · 0.34mm/px · z∈[-55,+85]mm · 7 of 84 slices shown, 9 images]
[im 7/84  soft-tissue]
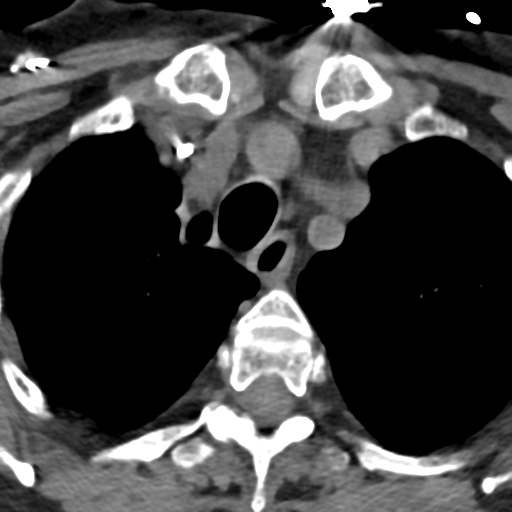
[im 7/84  bone]
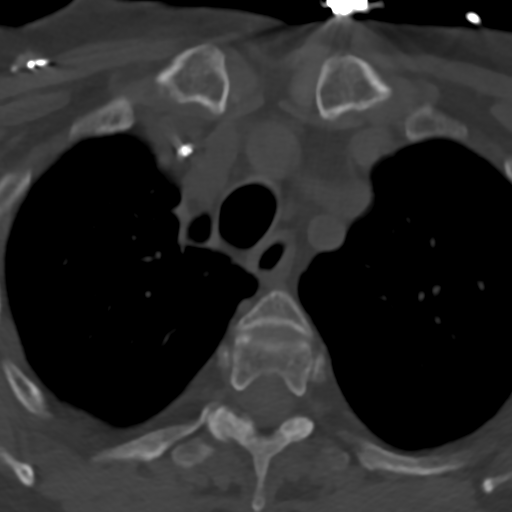
[im 20/84  bone]
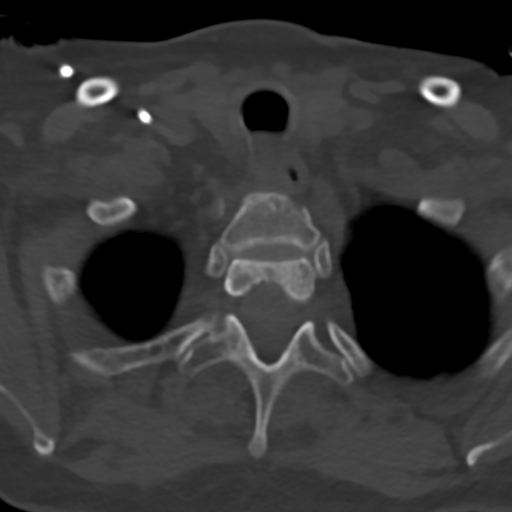
[im 32/84  bone]
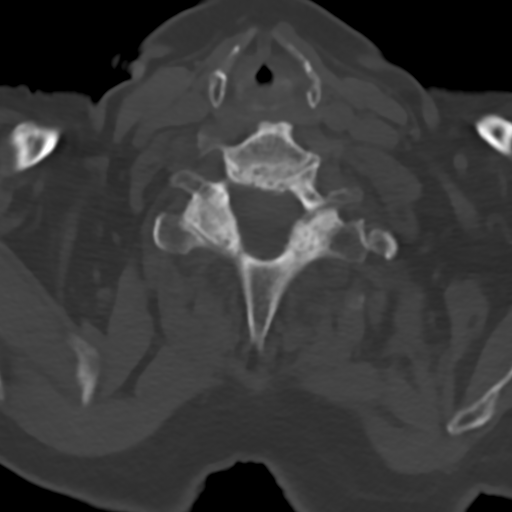
[im 45/84  bone]
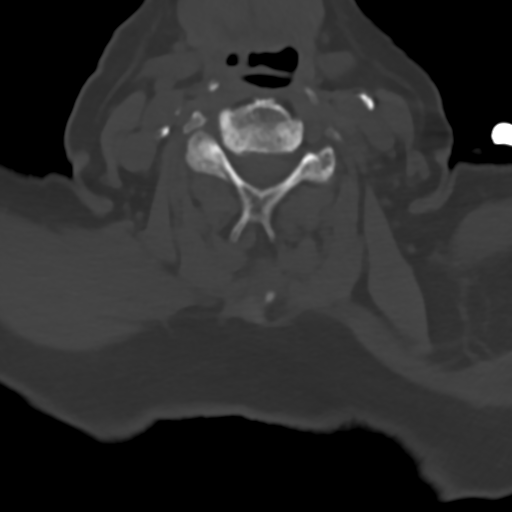
[im 52/84  soft-tissue]
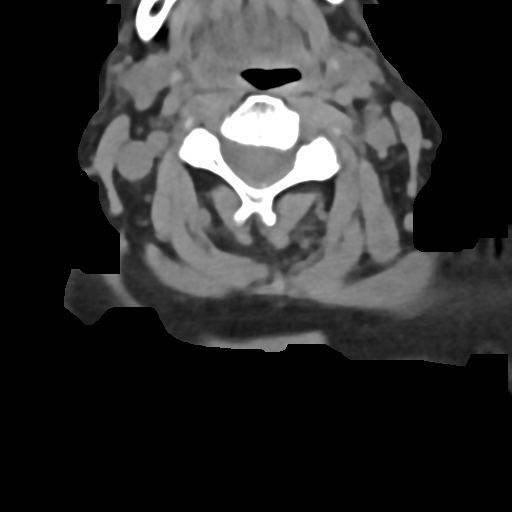
[im 52/84  bone]
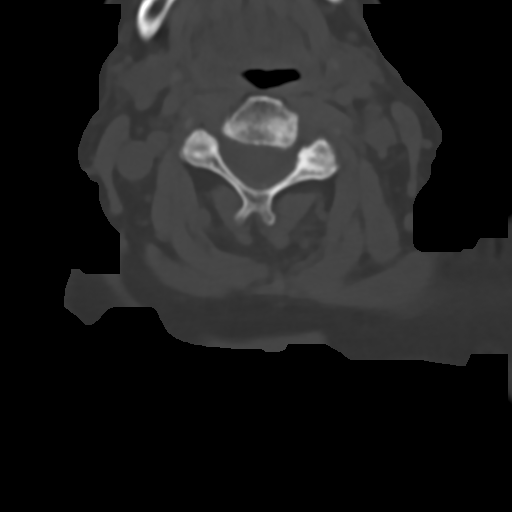
[im 64/84  bone]
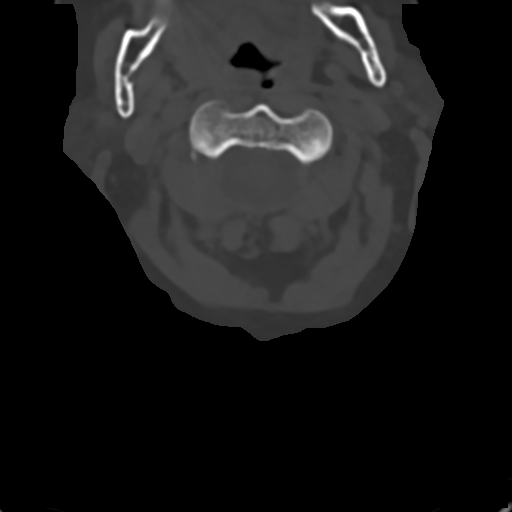
[im 77/84  bone]
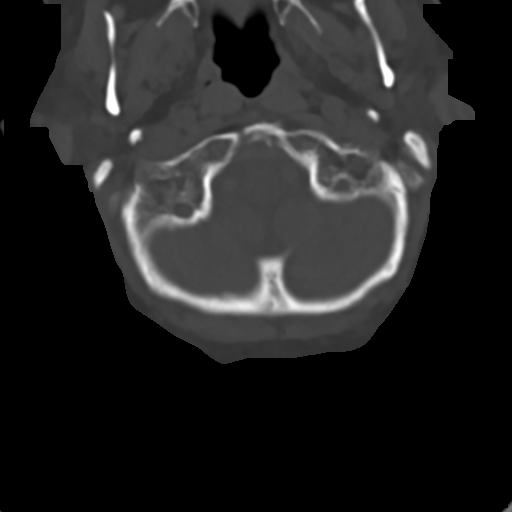

[Series 6: c_spine 2.0 sag bone · sagittal · 0.33mm/px · 5 of 61 slices shown, 6 images]
[im 21/61  bone]
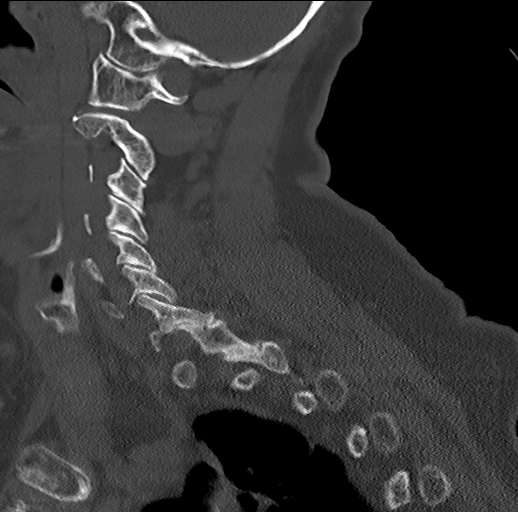
[im 26/61  bone]
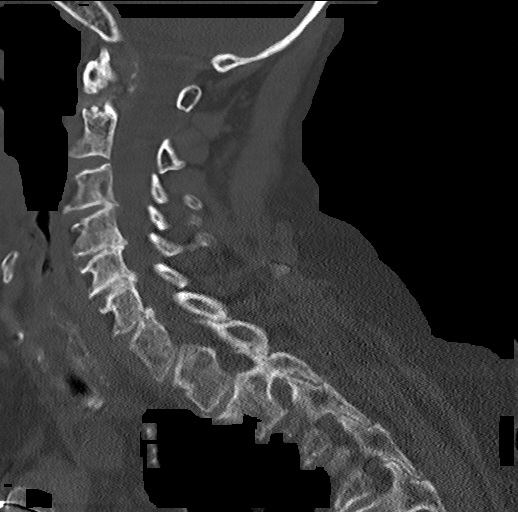
[im 31/61  soft-tissue]
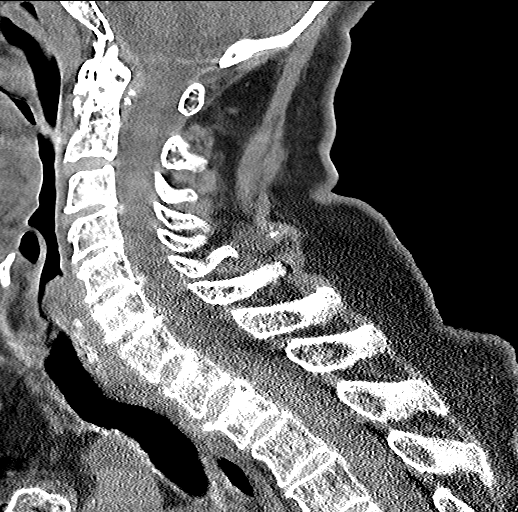
[im 31/61  bone]
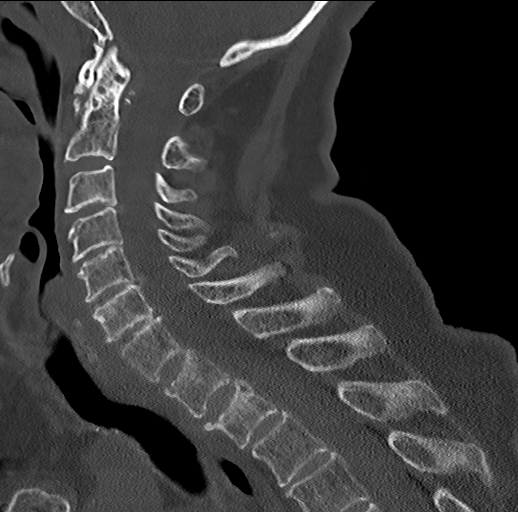
[im 36/61  bone]
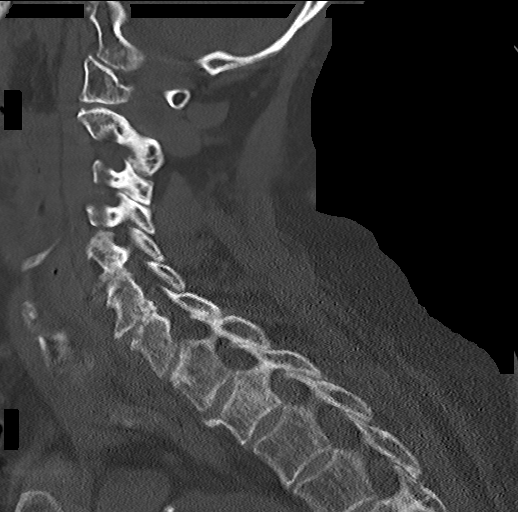
[im 41/61  bone]
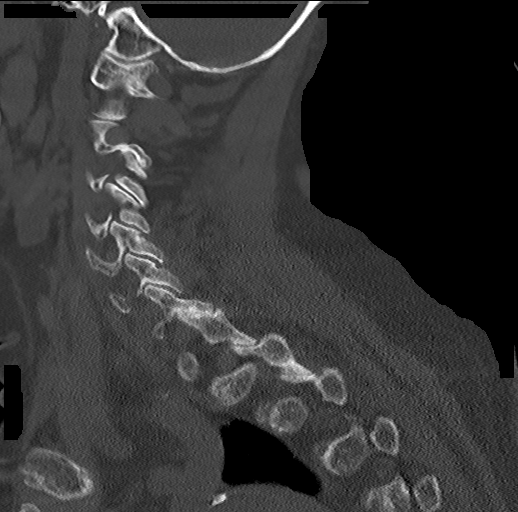

[Series 7: c_spine 2.0 cor bone · coronal · 0.23mm/px · 3 of 77 slices shown]
[im 16/77  bone]
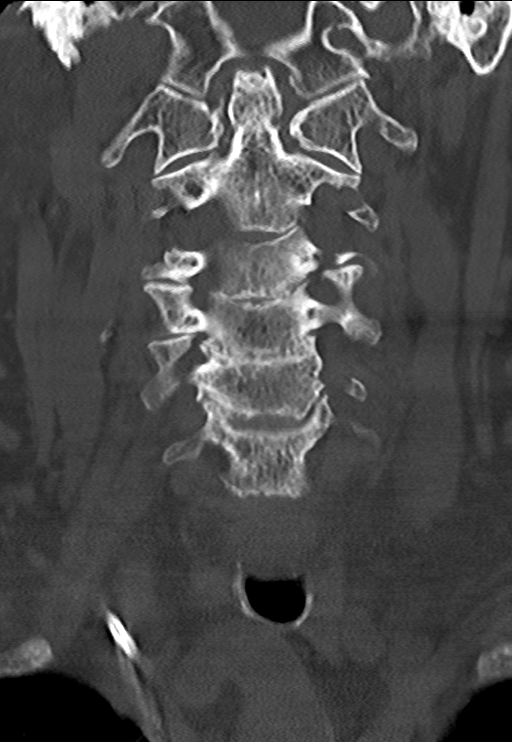
[im 31/77  bone]
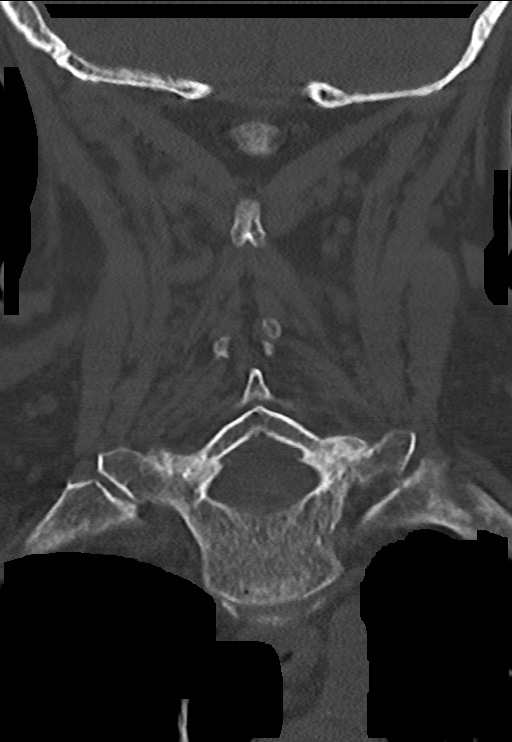
[im 46/77  bone]
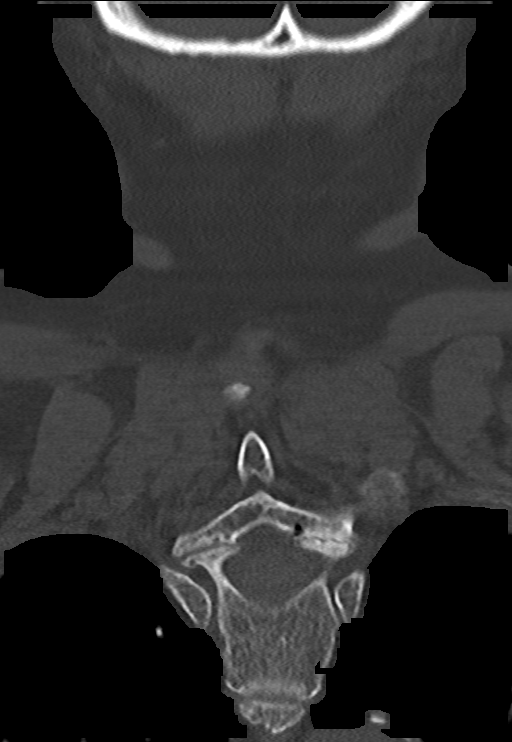

[15 of 33 positions shown; findings below may reference images not displayed]

FINDINGS: Alignment: Alignment is grossly anatomic.

Skull base and vertebrae: No acute displaced cervical spine
fractures.

Soft tissues and spinal canal: No prevertebral fluid or swelling. No
visible canal hematoma. Right chest wall port via internal jugular
approach is identified.

Disc levels: There is mild diffuse spondylosis greatest at C4-5 and
C5-6. Mild facet hypertrophy at the cervicothoracic junction. No
significant bony encroachment upon the central canal. Asymmetric
right neural foraminal narrowing at C4-5 and C5-6.

Upper chest: Airway is patent.  Lung apices are clear.

Other: Reconstructed images demonstrate no additional findings.
IMPRESSION: 1. Multilevel cervical spondylosis greatest at C4-5 and C5-6.
2. No acute cervical spine fracture.

## 2023-02-15 ENCOUNTER — Other Ambulatory Visit: Payer: Self-pay | Admitting: *Deleted
